# Patient Record
Sex: Male | Born: 1955 | Race: White | Hispanic: No | Smoking: Former smoker
Health system: Southern US, Community
[De-identification: ages and names within clinical notes are randomized; demographics above are authoritative.]

## PROBLEM LIST (undated history)

## (undated) DIAGNOSIS — Z21 Asymptomatic human immunodeficiency virus [HIV] infection status: Secondary | ICD-10-CM

## (undated) DIAGNOSIS — C61 Malignant neoplasm of prostate: Secondary | ICD-10-CM

## (undated) DIAGNOSIS — B20 Human immunodeficiency virus [HIV] disease: Secondary | ICD-10-CM

## (undated) DIAGNOSIS — Z9189 Other specified personal risk factors, not elsewhere classified: Secondary | ICD-10-CM

## (undated) DIAGNOSIS — K219 Gastro-esophageal reflux disease without esophagitis: Secondary | ICD-10-CM

## (undated) DIAGNOSIS — E119 Type 2 diabetes mellitus without complications: Secondary | ICD-10-CM

## (undated) DIAGNOSIS — R399 Unspecified symptoms and signs involving the genitourinary system: Secondary | ICD-10-CM

## (undated) DIAGNOSIS — E785 Hyperlipidemia, unspecified: Secondary | ICD-10-CM

## (undated) DIAGNOSIS — I10 Essential (primary) hypertension: Secondary | ICD-10-CM

## (undated) DIAGNOSIS — Z860100 Personal history of colon polyps, unspecified: Secondary | ICD-10-CM

## (undated) DIAGNOSIS — M1990 Unspecified osteoarthritis, unspecified site: Secondary | ICD-10-CM

## (undated) DIAGNOSIS — Z973 Presence of spectacles and contact lenses: Secondary | ICD-10-CM

## (undated) DIAGNOSIS — Z923 Personal history of irradiation: Secondary | ICD-10-CM

## (undated) HISTORY — DX: Unspecified osteoarthritis, unspecified site: M19.90

## (undated) HISTORY — PX: PROSTATE BIOPSY: SHX241

## (undated) HISTORY — PX: EYE SURGERY: SHX253

## (undated) HISTORY — PX: JOINT REPLACEMENT: SHX530

## (undated) HISTORY — DX: Malignant neoplasm of prostate: C61

## (undated) HISTORY — DX: Essential (primary) hypertension: I10

## (undated) HISTORY — DX: Gastro-esophageal reflux disease without esophagitis: K21.9

---

## 1983-12-17 HISTORY — PX: OTHER SURGICAL HISTORY: SHX169

## 1986-12-16 HISTORY — PX: OTHER SURGICAL HISTORY: SHX169

## 2011-12-17 HISTORY — PX: COLONOSCOPY W/ POLYPECTOMY: SHX1380

## 2014-04-01 ENCOUNTER — Ambulatory Visit (INDEPENDENT_AMBULATORY_CARE_PROVIDER_SITE_OTHER): Payer: BC Managed Care – PPO | Admitting: Internal Medicine

## 2014-04-01 DIAGNOSIS — Z7189 Other specified counseling: Secondary | ICD-10-CM

## 2014-04-01 DIAGNOSIS — Z7184 Encounter for health counseling related to travel: Secondary | ICD-10-CM

## 2014-04-01 DIAGNOSIS — Z23 Encounter for immunization: Secondary | ICD-10-CM

## 2014-04-01 MED ORDER — ATOVAQUONE-PROGUANIL HCL 250-100 MG PO TABS: 1.0000 | ORAL_TABLET | Freq: Every day | ORAL | Status: DC

## 2014-04-01 MED ORDER — CIPROFLOXACIN HCL 500 MG PO TABS: 500.0000 mg | ORAL_TABLET | Freq: Two times a day (BID) | ORAL | Status: DC

## 2014-04-01 MED ORDER — AZITHROMYCIN 500 MG PO TABS: 1000.0000 mg | ORAL_TABLET | Freq: Once | ORAL | Status: DC

## 2014-04-01 NOTE — Progress Notes (Signed)
  Subjective:    MERRITT MCCRAVY is a 58 y.o. male who presents to the Infectious Disease clinic for travel consultation. Planned departure date: October 2015          Planned return date: 10 days Countries of travel: [place] Areas in country: rural   Accommodations: rural and safari Purpose of travel: vacation Prior travel out of [place]: yes Currently ill / Fever: no History of liver or kidney disease: no  Data Review:  metformin and lisinopril   Review of Systems n/a    Objective:    n/a    Assessment:    No contraindications to travel. none      Plan:    Issues discussed: environmental concerns, future shots, insect-borne illnesses, malaria, motion sickness, MVA safety, rabies, safe food/water, traveler's diarrhea, website/handouts for more information, what to do if ill upon return, what to do if ill while there and Yellow Fever. Immunizations recommended: Typhoid (parenteral) and Yellow Fever. Malaria prophylaxis: malarone, daily dose starting 1-2 days before entering endemic area, ending 7 days after leaving area Traveler's diarrhea prophylaxis: ciprofloxacin.

## 2014-11-20 ENCOUNTER — Encounter: Payer: Self-pay | Admitting: Radiation Oncology

## 2014-11-20 DIAGNOSIS — C61 Malignant neoplasm of prostate: Secondary | ICD-10-CM | POA: Insufficient documentation

## 2014-11-20 NOTE — Progress Notes (Signed)
Radiation Oncology         [phone]) [phone] ________________________________  Initial outpatient Consultation  Name: William Mcfarland MRN: [id number]  Date: 11/21/2014  DOB: October 02, 1956  CC:No primary care provider on file.  Claybon Jabs, MD   REFERRING PHYSICIAN: Claybon Jabs, MD  DIAGNOSIS: 58 y.o. gentleman with stage T1c adenocarcinoma of the prostate with a Gleason's score of 4+3 and a PSA of 6.83    ICD-9-CM ICD-10-CM   1. Travel advice encounter V65.49 Z71.89   2. Malignant neoplasm of prostate 185 C61     HISTORY OF PRESENT ILLNESS::William Mcfarland is a 58 y.o. gentleman.  He was noted to have an elevated PSA of 6.83 on 07/26/14 by his primary care physician, Dr. Kathyrn Lass.  This was an increase from 2.86 on 02/02/13.  Accordingly, he was referred for evaluation in urology by Dr. Karsten Ro.  Digital rectal examination was performed at that time revealing no nodules.  The patient proceeded to transrectal ultrasound with 12 biopsies of the prostate on 08/23/14.  The prostate volume measured 32.56 cc.  Out of 12 core biopsies, 6 were positive.  The maximum Gleason score was 4+3, and this was seen in the left lateral base as displayed below:     The patient reviewed the biopsy results with his urologist and he has kindly been referred today for discussion of potential radiation treatment options.  PREVIOUS RADIATION THERAPY: No  PAST MEDICAL HISTORY:  has a past medical history of Prostate cancer; Arthritis; Diabetes mellitus without complication; GERD (gastroesophageal reflux disease); Hypercholesterolemia; and Hypertension.    PAST SURGICAL HISTORY: Past Surgical History  Procedure Laterality Date  . Prostate biopsy    . Ankle surgery    . Foot surgery    . Leg surgery      FAMILY HISTORY: family history includes Stroke in his father and mother. There is no history of Cancer.  SOCIAL HISTORY:  reports that he has quit smoking. His smoking use included  Cigarettes. He has a 7.5 pack-year smoking history. He has never used smokeless tobacco. He reports that he does not drink alcohol or use illicit drugs.  ALLERGIES: Review of patient's allergies indicates no known allergies.  MEDICATIONS:  Current Outpatient Prescriptions  Medication Sig Dispense Refill  . atovaquone-proguanil (MALARONE) 250-100 MG TABS Take 1 tablet by mouth daily. Start 2 days prior to travel to malaria area, throughout travel and for 7 days upon return. (Patient not taking: Reported on 11/21/2014) 17 tablet 0  . ciprofloxacin (CIPRO) 500 MG tablet Take 1 tablet (500 mg total) by mouth 2 (two) times daily. Take 2-3 days until diarrhea resolves (Patient not taking: Reported on 11/21/2014) 8 tablet 0  . lisinopril (PRINIVIL,ZESTRIL) 10 MG tablet     . metFORMIN (GLUMETZA) 500 MG (MOD) 24 hr tablet Take 500 mg by mouth daily after supper.    . simvastatin (ZOCOR) 20 MG tablet     . topiramate (TOPAMAX) 100 MG tablet      No current facility-administered medications for this encounter.    REVIEW OF SYSTEMS:  A 15 point review of systems is documented in the electronic medical record. This was obtained by the nursing staff. However, I reviewed this with the patient to discuss relevant findings and make appropriate changes.  A comprehensive review of systems was negative..  The patient completed an IPSS and IIEF questionnaire.  His IPSS score was 4 indicating mild urinary outflow obstructive symptoms.  He indicated that his erectile function  is almost always able to complete sexual activity.   PHYSICAL EXAM: This patient is in no acute distress.  He is alert and oriented.   height is 5\' 5"  (1.651 m) and weight is 196 lb 1.6 oz (88.95 kg).  He exhibits no respiratory distress or labored breathing.  He appears neurologically intact.  His mood is pleasant.  His affect is appropriate.  Please note the digital rectal exam findings described above.  KPS = 100  100 - Normal; no complaints;  no evidence of disease. 90   - Able to carry on normal activity; minor signs or symptoms of disease. 80   - Normal activity with effort; some signs or symptoms of disease. 36   - Cares for self; unable to carry on normal activity or to do active work. 60   - Requires occasional assistance, but is able to care for most of his personal needs. 50   - Requires considerable assistance and frequent medical care. 34   - Disabled; requires special care and assistance. 18   - Severely disabled; hospital admission is indicated although death not imminent. 30   - Very sick; hospital admission necessary; active supportive treatment necessary. 10   - Moribund; fatal processes progressing rapidly. 0     - Dead  Karnofsky DA, Abelmann WH, Craver LS and Burchenal JH [phone]) The use of the nitrogen mustards in the palliative treatment of carcinoma: with particular reference to bronchogenic carcinoma Cancer 1 634-56   LABORATORY DATA:  No results found for: WBC, HGB, HCT, MCV, PLT No results found for: NA, K, CL, CO2 No results found for: ALT, AST, GGT, ALKPHOS, BILITOT   RADIOGRAPHY: No results found.    IMPRESSION: This gentleman is a 58 y.o. gentleman with stage T1c adenocarcinoma of the prostate with a Gleason's score of 4+3 and a PSA of 6.83.  His T-Stage, Gleason's Score, and PSA put him into the intermediate risk group.  Accordingly he is eligible for a variety of potential treatment options including robotic-assisted laparoscopic radical prostatectomy, or external beam radiation treatment with or without seed implant boost. In addition to radiation therapy, this is a patient who may benefit from hormone therapy as well.Marland Kitchen  PLAN:Today I reviewed the findings and workup thus far.  We discussed the natural history of prostate cancer.  We reviewed the the implications of T-stage, Gleason's Score, and PSA on decision-making and outcomes in prostate cancer.  We discussed radiation treatment in the management  of prostate cancer with regard to the logistics and delivery of external beam radiation treatment as well as the logistics and delivery of prostate brachytherapy.  We compared and contrasted each of these approaches and also compared these against prostatectomy.  The patient expressed interest in external beam radiotherapy followed by prostate seed implant boost.  I filled out a patient counseling form for him with relevant treatment diagrams and we retained a copy for our records.   We also spent time talking about androgen deprivation and the potential advantages of this treatment in conjunction with radiation for intermediate risk prostate cancer. We talked about the pros and cons of hormone therapy focusing on the potential adverse effects. We also talked about some of the evidence supporting hormone therapy to improve disease free survival  The patient would like to proceed with external beam radiotherapy followed by prostate seed implant boost.  I will share my findings with Dr. Karsten Ro and move forward with scheduling the initiation of hormone therapy in early January 2016  to be followed by placement of three gold fiducial markers into the prostate in early February to proceed with external radiotherapy in the first week of March 2016, to be followed by prostate seed implant boost.     I enjoyed meeting with him today, and will look forward to participating in the care of this very nice gentleman.   I spent 60 minutes face to face with the patient and more than 50% of that time was spent in counseling and/or coordination of care.   ------------------------------------------------  Sheral Apley. Tammi Klippel, M.D.

## 2014-11-20 NOTE — Progress Notes (Signed)
GU Location of Tumor / Histology: prostatic adenocarcinoma  If Prostate Cancer, Gleason Score is (4 + 3) and PSA is (6.83)  Biopsies of prostate revealed:    Past/Anticipated interventions by urology, if any: prostate biopsy, androgen deprivation?, referral to radiation oncology   Past/Anticipated interventions by medical oncology, if any: no  Weight changes, if any: no  Bowel/Bladder complaints, if any: yes, urinary urgency, nocturia, weak urine stream post void dribble, increased frequency   Nausea/Vomiting, if any: no  Pain issues, if any:  no  SAFETY ISSUES:  Prior radiation? no  Pacemaker/ICD? no  Possible current pregnancy? no  Is the patient on methotrexate? 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

## 2014-11-21 ENCOUNTER — Ambulatory Visit
Admission: RE | Admit: 2014-11-21 | Discharge: 2014-11-21 | Disposition: A | Discharge status: Home / Self Care | Payer: BC Managed Care – PPO | Source: Ambulatory Visit | Attending: Radiation Oncology | Admitting: Radiation Oncology

## 2014-11-21 ENCOUNTER — Encounter: Payer: Self-pay | Admitting: Radiation Oncology

## 2014-11-21 VITALS — Ht 65.0 in | Wt 196.1 lb

## 2014-11-21 DIAGNOSIS — C61 Malignant neoplasm of prostate: Secondary | ICD-10-CM | POA: Diagnosis not present

## 2014-11-21 DIAGNOSIS — Z7189 Other specified counseling: Secondary | ICD-10-CM | POA: Diagnosis not present

## 2014-11-21 DIAGNOSIS — Z7184 Encounter for health counseling related to travel: Secondary | ICD-10-CM

## 2014-11-21 NOTE — Progress Notes (Signed)
Reports nocturia x 2. Denies dysuria. Reports hematuria following biopsy resolved. Describes a strong steady urine stream with an occasional post void dribble. Concerned about weight gain. Denies night sweats or bone pain. Denies diarrhea. Denies nausea, vomiting, headache or dizziness. Denies fatigue. Works full time for [employer]. BP slightly elevated.

## 2014-11-21 NOTE — Progress Notes (Signed)
See progress note under physician encounter. 

## 2014-11-22 ENCOUNTER — Telehealth: Payer: Self-pay | Admitting: *Deleted

## 2014-11-22 NOTE — Telephone Encounter (Signed)
CALLED PATIENT TO INFORM OF HORMONE THERAPY 12-21-14 - ARRIVAL TIME - 10:15 AM, GOLD SEED PLACEMENT ON 01-20-15- ARRIVAL TIME - 1:45 PM @ DR. OTTELIN'S OFFICE, AND HIS SIM ON 02-10-15 @ 9 AM @ DR. MANNING'S OFFICE, SPOKE WITH PATIENT AND HE IS AWARE OF THESE APPTS.

## 2014-11-22 NOTE — Addendum Note (Signed)
Encounter addended by: Heywood Footman, RN on: 11/22/2014 10:25 AM<BR>     Documentation filed: Inpatient Patient Education, Inpatient Document Flowsheet

## 2014-11-22 NOTE — Addendum Note (Signed)
Encounter addended by: Heywood Footman, RN on: 11/22/2014  9:49 AM<BR>     Documentation filed: Charges VN

## 2015-02-10 ENCOUNTER — Encounter: Payer: Self-pay | Admitting: Radiation Oncology

## 2015-02-10 ENCOUNTER — Ambulatory Visit
Admission: RE | Admit: 2015-02-10 | Discharge: 2015-02-10 | Disposition: A | Discharge status: Home / Self Care | Payer: BC Managed Care – PPO | Source: Ambulatory Visit | Attending: Radiation Oncology | Admitting: Radiation Oncology

## 2015-02-10 DIAGNOSIS — C61 Malignant neoplasm of prostate: Secondary | ICD-10-CM | POA: Diagnosis not present

## 2015-02-10 NOTE — Progress Notes (Signed)
  Radiation Oncology         [phone]) [phone] ________________________________  Name: William Mcfarland MRN: [id number]  Date: 02/10/2015  DOB: 28-Mar-1956  SIMULATION AND TREATMENT PLANNING NOTE    ICD-9-CM ICD-10-CM   1. Malignant neoplasm of prostate 185 C61     DIAGNOSIS:  59 y.o. gentleman with stage T1c adenocarcinoma of the prostate with a Gleason's score of 4+3 and a PSA of 6.83  NARRATIVE:  The patient was brought to the [facility].  Identity was confirmed.  All relevant records and images related to the planned course of therapy were reviewed.  The patient freely provided informed written consent to proceed with treatment after reviewing the details related to the planned course of therapy. The consent form was witnessed and verified by the simulation staff.  Then, the patient was set-up in a stable reproducible supine position for radiation therapy.  A vacuum lock pillow device was custom fabricated to position his legs in a reproducible immobilized position.  Then, I performed a urethrogram under sterile conditions to identify the prostatic apex.  CT images were obtained.  Surface markings were placed.  The CT images were loaded into the planning software.  Then the prostate target and avoidance structures including the rectum, bladder, bowel and hips were contoured.  Treatment planning then occurred.  The radiation prescription was entered and confirmed.  A total of 5 complex treatment devices were fabricated with the mold and 4 MLC apertures to shape radiation around the prostate shielding critical rectum and bladder. I have requested : 3D Simulation  I have requested a DVH of the following structures: bladder, rectum, left femoral head, right femoral head, small bowel, and prostate.  PLAN:  The patient will receive 45 Gy in 25 fractions followed by seed boost  ________________________________  Sheral Apley. Tammi Klippel, M.D.

## 2015-02-12 NOTE — Progress Notes (Signed)
  Radiation Oncology         [phone]) [phone] ________________________________  Name: William Mcfarland MRN: [id number]  Date: 02/10/2015  DOB: [birth year]  SIMULATION AND TREATMENT PLANNING NOTE PUBIC ARCH STUDY  CC:No primary care provider on file.  No ref. provider found  DIAGNOSIS:   59 y.o. gentleman with stage T1c adenocarcinoma of the prostate with a Gleason's score of 4+3 and a PSA of 6.83  COMPLEX SIMULATION:  The patient presented today for evaluation for external radiation planning and possible prostate seed implant boost.  Using three-dimensional radiation planning tools I reconstructed the prostate in view of the structures from the transperineal needle pathway to assess for possible pubic arch interference. In doing so, I did not appreciate any pubic arch interference. Also, the patient's prostate volume was estimated based on the drawn structure. The volume was 40 cc.  Given the pubic arch appearance and prostate volume, patient remains a good candidate to proceed with prostate seed implant. Today, he freely provided informed written consent to proceed.    PLAN: The patient will undergo prostate seed implant as a boost after external radiation.   ________________________________  Sheral Apley Tammi Klippel, M.D.

## 2015-02-13 DIAGNOSIS — C61 Malignant neoplasm of prostate: Secondary | ICD-10-CM | POA: Diagnosis not present

## 2015-02-14 DIAGNOSIS — C61 Malignant neoplasm of prostate: Secondary | ICD-10-CM | POA: Diagnosis not present

## 2015-02-15 ENCOUNTER — Other Ambulatory Visit: Payer: Self-pay | Admitting: Urology

## 2015-02-17 ENCOUNTER — Ambulatory Visit
Admission: RE | Admit: 2015-02-17 | Discharge: 2015-02-17 | Disposition: A | Discharge status: Home / Self Care | Payer: BC Managed Care – PPO | Source: Ambulatory Visit | Attending: Radiation Oncology | Admitting: Radiation Oncology

## 2015-02-17 DIAGNOSIS — C61 Malignant neoplasm of prostate: Secondary | ICD-10-CM | POA: Diagnosis not present

## 2015-02-20 ENCOUNTER — Ambulatory Visit
Admission: RE | Admit: 2015-02-20 | Discharge: 2015-02-20 | Disposition: A | Discharge status: Home / Self Care | Payer: BC Managed Care – PPO | Source: Ambulatory Visit | Attending: Radiation Oncology | Admitting: Radiation Oncology

## 2015-02-20 DIAGNOSIS — C61 Malignant neoplasm of prostate: Secondary | ICD-10-CM | POA: Diagnosis not present

## 2015-02-21 ENCOUNTER — Ambulatory Visit
Admission: RE | Admit: 2015-02-21 | Discharge: 2015-02-21 | Disposition: A | Discharge status: Home / Self Care | Payer: BC Managed Care – PPO | Source: Ambulatory Visit | Attending: Radiation Oncology | Admitting: Radiation Oncology

## 2015-02-21 DIAGNOSIS — C61 Malignant neoplasm of prostate: Secondary | ICD-10-CM | POA: Diagnosis not present

## 2015-02-22 ENCOUNTER — Ambulatory Visit
Admission: RE | Admit: 2015-02-22 | Discharge: 2015-02-22 | Disposition: A | Discharge status: Home / Self Care | Payer: BC Managed Care – PPO | Source: Ambulatory Visit | Attending: Radiation Oncology | Admitting: Radiation Oncology

## 2015-02-22 DIAGNOSIS — C61 Malignant neoplasm of prostate: Secondary | ICD-10-CM | POA: Diagnosis not present

## 2015-02-23 ENCOUNTER — Ambulatory Visit
Admission: RE | Admit: 2015-02-23 | Discharge: 2015-02-23 | Disposition: A | Discharge status: Home / Self Care | Payer: BC Managed Care – PPO | Source: Ambulatory Visit | Attending: Radiation Oncology | Admitting: Radiation Oncology

## 2015-02-23 DIAGNOSIS — C61 Malignant neoplasm of prostate: Secondary | ICD-10-CM | POA: Diagnosis not present

## 2015-02-24 ENCOUNTER — Encounter: Payer: Self-pay | Admitting: Radiation Oncology

## 2015-02-24 ENCOUNTER — Ambulatory Visit
Admission: RE | Admit: 2015-02-24 | Discharge: 2015-02-24 | Disposition: A | Discharge status: Home / Self Care | Payer: BC Managed Care – PPO | Source: Ambulatory Visit | Attending: Radiation Oncology | Admitting: Radiation Oncology

## 2015-02-24 VITALS — BP 157/98 | HR 67 | Resp 16 | Wt 199.3 lb

## 2015-02-24 DIAGNOSIS — C61 Malignant neoplasm of prostate: Secondary | ICD-10-CM | POA: Diagnosis not present

## 2015-02-24 NOTE — Progress Notes (Signed)
BP elevated. Reports nocturia, increased urinary frequency and dysuria. Denies diarrhea or fatigue. Denies pain. Weight stable.  Oriented patient to staff and routine of the clinic. Provided patient with RADIATION THERAPY AND YOU handbook then, reviewed pertinent information. Educated patient reference potential side effects and management such as, fatigue, urinary/bladder changes and fatigue. Patient verbalized understanding of all reviewed.

## 2015-02-24 NOTE — Progress Notes (Signed)
  Radiation Oncology         [phone]) [phone] ________________________________  Name: William Mcfarland MRN: [id number]  Date: 02/24/2015  DOB: 06/05/1956     Weekly Radiation Therapy Management    ICD-9-CM ICD-10-CM   1. Malignant neoplasm of prostate 185 C61     Current Dose: 9 Gy     Planned Dose:  45 Gy plus seed implant  Narrative . . . . . . . . The patient presents for routine under treatment assessment.                                   Reports nocturia, increased urinary frequency and dysuria. Denies diarrhea or fatigue. Denies pain. Weight stable                                 Set-up films were reviewed.                                 The chart was checked. Physical Findings. . .  weight is 199 lb 4.8 oz (90.402 kg). His blood pressure is 157/98 and his pulse is 67. His respiration is 16. . Weight essentially stable.  No significant changes. Impression . . . . . . . The patient is tolerating radiation. Plan . . . . . . . . . . . . Continue treatment as planned.  ________________________________  Sheral Apley. Tammi Klippel, M.D.

## 2015-02-27 ENCOUNTER — Ambulatory Visit
Admission: RE | Admit: 2015-02-27 | Discharge: 2015-02-27 | Disposition: A | Discharge status: Home / Self Care | Payer: BC Managed Care – PPO | Source: Ambulatory Visit | Attending: Radiation Oncology | Admitting: Radiation Oncology

## 2015-02-27 DIAGNOSIS — C61 Malignant neoplasm of prostate: Secondary | ICD-10-CM | POA: Diagnosis not present

## 2015-02-28 ENCOUNTER — Ambulatory Visit
Admission: RE | Admit: 2015-02-28 | Discharge: 2015-02-28 | Disposition: A | Discharge status: Home / Self Care | Payer: BC Managed Care – PPO | Source: Ambulatory Visit | Attending: Radiation Oncology | Admitting: Radiation Oncology

## 2015-02-28 DIAGNOSIS — C61 Malignant neoplasm of prostate: Secondary | ICD-10-CM | POA: Diagnosis not present

## 2015-03-01 ENCOUNTER — Ambulatory Visit
Admission: RE | Admit: 2015-03-01 | Discharge: 2015-03-01 | Disposition: A | Discharge status: Home / Self Care | Payer: BC Managed Care – PPO | Source: Ambulatory Visit | Attending: Radiation Oncology | Admitting: Radiation Oncology

## 2015-03-01 DIAGNOSIS — C61 Malignant neoplasm of prostate: Secondary | ICD-10-CM | POA: Diagnosis not present

## 2015-03-02 ENCOUNTER — Ambulatory Visit
Admission: RE | Admit: 2015-03-02 | Discharge: 2015-03-02 | Disposition: A | Discharge status: Home / Self Care | Payer: BC Managed Care – PPO | Source: Ambulatory Visit | Attending: Radiation Oncology | Admitting: Radiation Oncology

## 2015-03-02 DIAGNOSIS — C61 Malignant neoplasm of prostate: Secondary | ICD-10-CM | POA: Diagnosis not present

## 2015-03-03 ENCOUNTER — Ambulatory Visit
Admission: RE | Admit: 2015-03-03 | Discharge: 2015-03-03 | Disposition: A | Discharge status: Home / Self Care | Payer: BC Managed Care – PPO | Source: Ambulatory Visit | Attending: Radiation Oncology | Admitting: Radiation Oncology

## 2015-03-03 ENCOUNTER — Encounter: Payer: Self-pay | Admitting: Radiation Oncology

## 2015-03-03 VITALS — BP 173/101 | HR 79 | Resp 16 | Wt 198.2 lb

## 2015-03-03 DIAGNOSIS — C61 Malignant neoplasm of prostate: Secondary | ICD-10-CM

## 2015-03-03 NOTE — Progress Notes (Signed)
  Radiation Oncology         [phone]) [phone] ________________________________  Name: William Mcfarland MRN: [id number]  Date: 03/03/2015  DOB: [birth year]     Weekly Radiation Therapy Management    ICD-9-CM ICD-10-CM   1. Malignant neoplasm of prostate 185 C61     Current Dose: 18 Gy     Planned Dose:  45 Gy plus seed implant  Narrative . . . . . . . . The patient presents for routine under treatment assessment.                                   Weight and vitals stable. BP elevated. Reports he drank four cups of expresso prior to appt. Also, patient uncertain if he took bp medication this morning.  Encouraged to follow up with PCP if it continues to remain high. Patient verbalized understanding. Denies pain. Reports nocturia x 3. Reports he has horrible shoulder pain waking him up at night. Reports difficulty sleeping due to pain. Denies dysuria or hematuria. Denies diarrhea. Reports mild fatigue.                                  Set-up films were reviewed.                                 The chart was checked. Physical Findings. . .  weight is 198 lb 3.2 oz (89.903 kg). His blood pressure is 173/101 and his pulse is 79. His respiration is 16. . Weight essentially stable.  No significant changes. Impression . . . . . . . The patient is tolerating radiation. Plan . . . . . . . . . . . . Continue treatment as planned.  ________________________________  Sheral Apley. Tammi Klippel, M.D.

## 2015-03-03 NOTE — Progress Notes (Addendum)
Weight and vitals stable. BP elevated. Reports he drank four cups of expresso prior to appt. Also, patient uncertain if he took bp medication this morning.  Encouraged to follow up with PCP if it continues to remain high. Patient verbalized understanding. Denies pain. Reports nocturia x 3. Reports he has horrible shoulder pain waking him up at night. Reports difficulty sleeping due to pain. Denies dysuria or hematuria. Denies diarrhea. Reports mild fatigue.

## 2015-03-06 ENCOUNTER — Ambulatory Visit
Admission: RE | Admit: 2015-03-06 | Discharge: 2015-03-06 | Disposition: A | Discharge status: Home / Self Care | Payer: BC Managed Care – PPO | Source: Ambulatory Visit | Attending: Radiation Oncology | Admitting: Radiation Oncology

## 2015-03-06 ENCOUNTER — Other Ambulatory Visit: Payer: Self-pay

## 2015-03-06 ENCOUNTER — Encounter (HOSPITAL_BASED_OUTPATIENT_CLINIC_OR_DEPARTMENT_OTHER)
Admission: RE | Admit: 2015-03-06 | Discharge: 2015-03-06 | Disposition: A | Discharge status: Home / Self Care | Payer: BC Managed Care – PPO | Source: Ambulatory Visit | Attending: Urology | Admitting: Urology

## 2015-03-06 ENCOUNTER — Ambulatory Visit (HOSPITAL_BASED_OUTPATIENT_CLINIC_OR_DEPARTMENT_OTHER)
Admission: RE | Admit: 2015-03-06 | Discharge: 2015-03-06 | Disposition: A | Discharge status: Home / Self Care | Payer: BC Managed Care – PPO | Source: Ambulatory Visit | Attending: Urology | Admitting: Urology

## 2015-03-06 DIAGNOSIS — I10 Essential (primary) hypertension: Secondary | ICD-10-CM | POA: Insufficient documentation

## 2015-03-06 DIAGNOSIS — C61 Malignant neoplasm of prostate: Secondary | ICD-10-CM | POA: Diagnosis not present

## 2015-03-07 ENCOUNTER — Ambulatory Visit
Admission: RE | Admit: 2015-03-07 | Discharge: 2015-03-07 | Disposition: A | Discharge status: Home / Self Care | Payer: BC Managed Care – PPO | Source: Ambulatory Visit | Attending: Radiation Oncology | Admitting: Radiation Oncology

## 2015-03-07 DIAGNOSIS — C61 Malignant neoplasm of prostate: Secondary | ICD-10-CM | POA: Diagnosis not present

## 2015-03-08 ENCOUNTER — Ambulatory Visit
Admission: RE | Admit: 2015-03-08 | Discharge: 2015-03-08 | Disposition: A | Discharge status: Home / Self Care | Payer: BC Managed Care – PPO | Source: Ambulatory Visit | Attending: Radiation Oncology | Admitting: Radiation Oncology

## 2015-03-08 DIAGNOSIS — C61 Malignant neoplasm of prostate: Secondary | ICD-10-CM | POA: Diagnosis not present

## 2015-03-09 ENCOUNTER — Encounter: Payer: Self-pay | Admitting: Radiation Oncology

## 2015-03-09 ENCOUNTER — Ambulatory Visit
Admission: RE | Admit: 2015-03-09 | Discharge: 2015-03-09 | Disposition: A | Discharge status: Home / Self Care | Payer: BC Managed Care – PPO | Source: Ambulatory Visit | Attending: Radiation Oncology | Admitting: Radiation Oncology

## 2015-03-09 VITALS — BP 157/85 | HR 73 | Resp 16 | Wt 195.0 lb

## 2015-03-09 DIAGNOSIS — C61 Malignant neoplasm of prostate: Secondary | ICD-10-CM

## 2015-03-09 NOTE — Progress Notes (Signed)
Weight and vitals stable. Denies pain. Reports nocturia x3. Reports horrible right shoulder pain is a little less but now suffers from seasonal allergies. Denies dysuria or hematuria. Denies diarrhea. Reports mild fatigue.

## 2015-03-09 NOTE — Progress Notes (Signed)
  Radiation Oncology         [phone]) [phone] ________________________________  Name: William Mcfarland MRN: [id number]  Date: 03/09/2015  DOB: 03/09/1956     Weekly Radiation Therapy Management    ICD-9-CM ICD-10-CM   1. Malignant neoplasm of prostate 185 C61     Current Dose: 25.2 Gy     Planned Dose:  45 Gy plus seed implant  Narrative . . . . . . . . The patient presents for routine under treatment assessment.                                   Weight and vitals stable. Denies pain. Reports nocturia x3. Reports horrible right shoulder pain is a little less but now suffers from seasonal allergies. Denies dysuria or hematuria. Denies diarrhea. Reports mild fatigue.                                  Set-up films were reviewed.                                 The chart was checked. Physical Findings. . .  weight is 195 lb (88.451 kg). His blood pressure is 157/85 and his pulse is 73. His respiration is 16. . Weight essentially stable.  No significant changes. Impression . . . . . . . The patient is tolerating radiation. Plan . . . . . . . . . . . . Continue treatment as planned.  ________________________________  Sheral Apley. Tammi Klippel, M.D.

## 2015-03-10 ENCOUNTER — Ambulatory Visit: Payer: BC Managed Care – PPO | Admitting: Radiation Oncology

## 2015-03-10 ENCOUNTER — Ambulatory Visit
Admission: RE | Admit: 2015-03-10 | Discharge: 2015-03-10 | Disposition: A | Discharge status: Home / Self Care | Payer: BC Managed Care – PPO | Source: Ambulatory Visit | Attending: Radiation Oncology | Admitting: Radiation Oncology

## 2015-03-10 DIAGNOSIS — C61 Malignant neoplasm of prostate: Secondary | ICD-10-CM | POA: Diagnosis not present

## 2015-03-13 ENCOUNTER — Ambulatory Visit
Admission: RE | Admit: 2015-03-13 | Discharge: 2015-03-13 | Disposition: A | Discharge status: Home / Self Care | Payer: BC Managed Care – PPO | Source: Ambulatory Visit | Attending: Radiation Oncology | Admitting: Radiation Oncology

## 2015-03-13 DIAGNOSIS — C61 Malignant neoplasm of prostate: Secondary | ICD-10-CM | POA: Diagnosis not present

## 2015-03-14 ENCOUNTER — Ambulatory Visit
Admission: RE | Admit: 2015-03-14 | Discharge: 2015-03-14 | Disposition: A | Discharge status: Home / Self Care | Payer: BC Managed Care – PPO | Source: Ambulatory Visit | Attending: Radiation Oncology | Admitting: Radiation Oncology

## 2015-03-14 DIAGNOSIS — C61 Malignant neoplasm of prostate: Secondary | ICD-10-CM | POA: Diagnosis not present

## 2015-03-15 ENCOUNTER — Ambulatory Visit
Admission: RE | Admit: 2015-03-15 | Discharge: 2015-03-15 | Disposition: A | Discharge status: Home / Self Care | Payer: BC Managed Care – PPO | Source: Ambulatory Visit | Attending: Radiation Oncology | Admitting: Radiation Oncology

## 2015-03-15 DIAGNOSIS — C61 Malignant neoplasm of prostate: Secondary | ICD-10-CM | POA: Diagnosis not present

## 2015-03-16 ENCOUNTER — Ambulatory Visit
Admission: RE | Admit: 2015-03-16 | Discharge: 2015-03-16 | Disposition: A | Discharge status: Home / Self Care | Payer: BC Managed Care – PPO | Source: Ambulatory Visit | Attending: Radiation Oncology | Admitting: Radiation Oncology

## 2015-03-16 DIAGNOSIS — C61 Malignant neoplasm of prostate: Secondary | ICD-10-CM | POA: Diagnosis not present

## 2015-03-17 ENCOUNTER — Ambulatory Visit
Admission: RE | Admit: 2015-03-17 | Discharge: 2015-03-17 | Disposition: A | Discharge status: Home / Self Care | Payer: BC Managed Care – PPO | Source: Ambulatory Visit | Attending: Radiation Oncology | Admitting: Radiation Oncology

## 2015-03-17 ENCOUNTER — Encounter: Payer: Self-pay | Admitting: Radiation Oncology

## 2015-03-17 VITALS — BP 158/96 | HR 81 | Temp 98.0°F | Resp 16 | Wt 201.6 lb

## 2015-03-17 DIAGNOSIS — C61 Malignant neoplasm of prostate: Secondary | ICD-10-CM | POA: Diagnosis not present

## 2015-03-17 NOTE — Progress Notes (Signed)
Reports decreased appetite. Weight slightly elevated. BP elevated. Plans to follow up with PCP next week due to continued elevated BP. Reports nocturia x4. Reports right shoulder pain is worse this week related to bone spur. Denies dysuria or hematuria. Reports fatigue. Reports tingling sensation with urination.

## 2015-03-17 NOTE — Progress Notes (Signed)
  Radiation Oncology         [phone]) [phone] ________________________________  Name: William Mcfarland MRN: [id number]  Date: 03/17/2015  DOB: [birth year]     Weekly Radiation Therapy Management    ICD-9-CM ICD-10-CM   1. Malignant neoplasm of prostate 185 C61     Current Dose: 36Gy     Planned Dose:  45 Gy plus seed implant  Narrative . . . . . . . . The patient presents for routine under treatment assessment.                                    Reports decreased appetite. Weight slightly elevated. BP elevated. Plans to follow up with PCP next week due to continued elevated BP. Reports nocturia x4. Reports right shoulder pain is worse this week related to bone spur. Denies dysuria or hematuria. Reports fatigue. Reports tingling sensation with urination.                                  Set-up images were reviewed.                                 The chart was checked. Physical Findings. . .  weight is 201 lb 9.6 oz (91.445 kg). His oral temperature is 98 F (36.7 C). His blood pressure is 158/96 and his pulse is 81. His respiration is 16 and oxygen saturation is 100%. . Weight essentially stable.  No significant changes. Impression . . . . . . . The patient is tolerating radiation. Plan . . . . . . . . . . . . Continue treatment as planned. Offered Flomax Rx but patient would like to wait.  Nocturia is not very bothersome   -----------------------------------  Eppie Gibson, MD

## 2015-03-20 ENCOUNTER — Ambulatory Visit
Admission: RE | Admit: 2015-03-20 | Discharge: 2015-03-20 | Disposition: A | Discharge status: Home / Self Care | Payer: BC Managed Care – PPO | Source: Ambulatory Visit | Attending: Radiation Oncology | Admitting: Radiation Oncology

## 2015-03-20 DIAGNOSIS — C61 Malignant neoplasm of prostate: Secondary | ICD-10-CM | POA: Diagnosis not present

## 2015-03-21 ENCOUNTER — Ambulatory Visit
Admission: RE | Admit: 2015-03-21 | Discharge: 2015-03-21 | Disposition: A | Discharge status: Home / Self Care | Payer: BC Managed Care – PPO | Source: Ambulatory Visit | Attending: Radiation Oncology | Admitting: Radiation Oncology

## 2015-03-21 DIAGNOSIS — C61 Malignant neoplasm of prostate: Secondary | ICD-10-CM | POA: Diagnosis not present

## 2015-03-22 ENCOUNTER — Ambulatory Visit
Admission: RE | Admit: 2015-03-22 | Discharge: 2015-03-22 | Disposition: A | Discharge status: Home / Self Care | Payer: BC Managed Care – PPO | Source: Ambulatory Visit | Attending: Radiation Oncology | Admitting: Radiation Oncology

## 2015-03-22 DIAGNOSIS — C61 Malignant neoplasm of prostate: Secondary | ICD-10-CM | POA: Diagnosis not present

## 2015-03-23 ENCOUNTER — Ambulatory Visit
Admission: RE | Admit: 2015-03-23 | Discharge: 2015-03-23 | Disposition: A | Discharge status: Home / Self Care | Payer: BC Managed Care – PPO | Source: Ambulatory Visit | Attending: Radiation Oncology | Admitting: Radiation Oncology

## 2015-03-23 DIAGNOSIS — C61 Malignant neoplasm of prostate: Secondary | ICD-10-CM | POA: Diagnosis not present

## 2015-03-24 ENCOUNTER — Ambulatory Visit
Admission: RE | Admit: 2015-03-24 | Discharge: 2015-03-24 | Disposition: A | Discharge status: Home / Self Care | Payer: BC Managed Care – PPO | Source: Ambulatory Visit | Attending: Radiation Oncology | Admitting: Radiation Oncology

## 2015-03-24 ENCOUNTER — Encounter: Payer: Self-pay | Admitting: Radiation Oncology

## 2015-03-24 VITALS — BP 163/87 | HR 89 | Resp 16 | Wt 202.2 lb

## 2015-03-24 DIAGNOSIS — C61 Malignant neoplasm of prostate: Secondary | ICD-10-CM

## 2015-03-24 NOTE — Progress Notes (Signed)
  Radiation Oncology         [phone]) [phone] ________________________________  Name: William Mcfarland MRN: [id number]  Date: 03/24/2015  DOB: 18-Jun-1956     Weekly Radiation Therapy Management  No diagnosis found.  Current Dose: 45 Gy     Planned Dose:  45 Gy plus seed implant  Narrative . . . . . . . . The patient presents for routine under treatment assessment.                                   Reports nocturia x 5. Reports right shoulder pain continues which wakes him up often during the night as well. Describes urine stream as a weak dribble. Reports mild dysuria. Denies hematuria. Reports he passed a gold seed. Reports diarrhea related to high residual diet. Reports fatigue. Reports lisinopril has been increased by PCP to address continued elevated BP. Scheduled to follow up with Dr. Karsten Ro 5/20. Scheduled for seed implantation on 4/29.                                  Set-up films were reviewed.                                 The chart was checked. Physical Findings. . .  weight is 202 lb 3.2 oz (91.717 kg). His blood pressure is 163/87 and his pulse is 89. His respiration is 16. . Weight essentially stable.  No significant changes. Impression . . . . . . . The patient is tolerating radiation. Plan . . . . . . . . . . . . Complete treatment as planned and then seed implant.   This document serves as a record of services personally performed by Tyler Pita, MD. It was created on his behalf by Pearlie Oyster, a trained medical scribe. The creation of this record is based on the scribe's personal observations and the provider's statements to them. This document has been checked and approved by the attending provider.      ________________________________  Sheral Apley. Tammi Klippel, M.D.

## 2015-03-24 NOTE — Progress Notes (Signed)
Reports nocturia x 5. Reports right shoulder pain continues which wakes him up often during the night as well. Describes urine stream as a weak dribble. Reports mild dysuria. Denies hematuria. Reports he passed a gold seed. Reports diarrhea related to high residual diet. Reports fatigue. Reports lisinopril has been increased by PCP to address continued elevated BP. Scheduled to follow up with Dr. Karsten Ro 5/20. Scheduled for seed implantation on 4/29.

## 2015-03-28 NOTE — Progress Notes (Signed)
  Radiation Oncology         [phone]) [phone] ________________________________  Name: William Mcfarland MRN: [id number]  Date: 03/24/2015  DOB: December 06, 1956  End of Treatment Note    ICD-9-CM ICD-10-CM   1. Malignant neoplasm of prostate 185 C61    DIAGNOSIS: 59 y.o. gentleman with stage T1c adenocarcinoma of the prostate with a Gleason's score of 4+3 and a PSA of 6.83     Indication for treatment:  Curative,  Prostate Radiotherapy to be followed by seed implant boost  Radiation treatment dates:   02/20/2015-03/24/2015  Site/dose:  1. Initially, the prostate was treated with external radiotherapy to 45 Gy in 25 fractions of 1.8 Gy 2. Next, prostate seed implant will be used to boost the dose with 110 Gy for total of 155 Gy.  Beams/energy:  1. Initially, the prostate was treated with external radiotherapy using a 4-field technique, with 3D conformal AP, PA, Rt and Lt fields with 15 MV X-rays and daily image guidance.  Image guidance was performed with CB CT studies prior to each fraction. He was immobilized with a body fix lower extremity mold. 2. Next, prostate seed implant was used to boost the dose using Iodine-125 seeds..   Narrative: The patient tolerated radiation treatment relatively well.   He had minimal bladder and bowel issues.  Plan: The patient has completed external radiation treatment. He will return for seed implant boost. ________________________________  Sheral Apley. Tammi Klippel, M.D.

## 2015-04-06 ENCOUNTER — Telehealth: Payer: Self-pay | Admitting: *Deleted

## 2015-04-06 NOTE — Telephone Encounter (Signed)
CALLED PATIENT TO  REMIND OF LABS FOR 04-07-15, SPOKE WITH PATIENT AND HE IS AWARE OF THIS

## 2015-04-11 DIAGNOSIS — K219 Gastro-esophageal reflux disease without esophagitis: Secondary | ICD-10-CM | POA: Diagnosis not present

## 2015-04-11 DIAGNOSIS — Z87891 Personal history of nicotine dependence: Secondary | ICD-10-CM | POA: Diagnosis not present

## 2015-04-11 DIAGNOSIS — M1990 Unspecified osteoarthritis, unspecified site: Secondary | ICD-10-CM | POA: Diagnosis not present

## 2015-04-11 DIAGNOSIS — Z6829 Body mass index (BMI) 29.0-29.9, adult: Secondary | ICD-10-CM | POA: Diagnosis not present

## 2015-04-11 DIAGNOSIS — C61 Malignant neoplasm of prostate: Secondary | ICD-10-CM | POA: Diagnosis not present

## 2015-04-11 DIAGNOSIS — E780 Pure hypercholesterolemia: Secondary | ICD-10-CM | POA: Diagnosis not present

## 2015-04-11 DIAGNOSIS — E669 Obesity, unspecified: Secondary | ICD-10-CM | POA: Diagnosis not present

## 2015-04-11 DIAGNOSIS — E119 Type 2 diabetes mellitus without complications: Secondary | ICD-10-CM | POA: Diagnosis not present

## 2015-04-11 DIAGNOSIS — I10 Essential (primary) hypertension: Secondary | ICD-10-CM | POA: Diagnosis not present

## 2015-04-11 DIAGNOSIS — N402 Nodular prostate without lower urinary tract symptoms: Secondary | ICD-10-CM | POA: Diagnosis not present

## 2015-04-11 DIAGNOSIS — Z79899 Other long term (current) drug therapy: Secondary | ICD-10-CM | POA: Diagnosis not present

## 2015-04-11 DIAGNOSIS — Z791 Long term (current) use of non-steroidal anti-inflammatories (NSAID): Secondary | ICD-10-CM | POA: Diagnosis not present

## 2015-04-11 LAB — COMPREHENSIVE METABOLIC PANEL
ALT: 21 U/L (ref 0–53)
AST: 19 U/L (ref 0–37)
Albumin: 4.4 g/dL (ref 3.5–5.2)
Alkaline Phosphatase: 72 U/L (ref 39–117)
Anion gap: 10 (ref 5–15)
BUN: 19 mg/dL (ref 6–23)
CO2: 26 mmol/L (ref 19–32)
Calcium: 9.3 mg/dL (ref 8.4–10.5)
Chloride: 102 mmol/L (ref 96–112)
Creatinine, Ser: 0.74 mg/dL (ref 0.50–1.35)
GFR calc Af Amer: >90 mL/min (ref >90)
GFR calc non Af Amer: >90 mL/min (ref >90)
Glucose, Bld: 132 mg/dL — ABNORMAL HIGH (ref 70–99)
Potassium: 4.1 mmol/L (ref 3.5–5.1)
Sodium: 138 mmol/L (ref 135–145)
Total Bilirubin: 0.5 mg/dL (ref 0.3–1.2)
Total Protein: 7.2 g/dL (ref 6.0–8.3)

## 2015-04-11 LAB — PROTIME-INR
INR: 0.98 (ref 0.00–1.49)
Prothrombin Time: 13.1 seconds (ref 11.6–15.2)

## 2015-04-11 LAB — CBC
HCT: 40.1 % (ref 39.0–52.0)
Hemoglobin: 14.1 g/dL (ref 13.0–17.0)
MCH: 32 pg (ref 26.0–34.0)
MCHC: 35.2 g/dL (ref 30.0–36.0)
MCV: 90.9 fL (ref 78.0–100.0)
Platelets: 292 10*3/uL (ref 150–400)
RBC: 4.41 MIL/uL (ref 4.22–5.81)
RDW: 13 % (ref 11.5–15.5)
WBC: 7.3 10*3/uL (ref 4.0–10.5)

## 2015-04-11 LAB — APTT: aPTT: 29 seconds (ref 24–37)

## 2015-04-12 ENCOUNTER — Encounter (HOSPITAL_BASED_OUTPATIENT_CLINIC_OR_DEPARTMENT_OTHER): Payer: Self-pay | Admitting: *Deleted

## 2015-04-12 NOTE — Progress Notes (Signed)
   04/12/15 1621  OBSTRUCTIVE SLEEP APNEA  Have you ever been diagnosed with sleep apnea through a sleep study? No  Do you snore loudly (loud enough to be heard through closed doors)?  1  Do you often feel tired, fatigued, or sleepy during the daytime? 0  Has anyone observed you stop breathing during your sleep? 0  Do you have, or are you being treated for high blood pressure? 1  BMI more than 35 kg/m2? 0  Age over 59 years old? 1  Neck circumference greater than 40 cm/16 inches? 1  Gender: 1  Obstructive Sleep Apnea Score 5

## 2015-04-12 NOTE — Progress Notes (Signed)
NPO AFTER MN. ARRIVE AT 0600. CURRENT LAB RESULTS, EKG , CXR IN CHART AND EPIC. WILL DO FLEET ENEMA AM DOS.

## 2015-04-13 ENCOUNTER — Telehealth: Payer: Self-pay | Admitting: *Deleted

## 2015-04-13 NOTE — Telephone Encounter (Signed)
CALLED PATIENT TO REMIND OF PROCEDURE FOR 04-14-15, SPOKE WITH PATIENT AND HE IS AWARE OF THIS PROCEDURE

## 2015-04-14 ENCOUNTER — Ambulatory Visit (HOSPITAL_BASED_OUTPATIENT_CLINIC_OR_DEPARTMENT_OTHER): Payer: BC Managed Care – PPO | Admitting: Anesthesiology

## 2015-04-14 ENCOUNTER — Ambulatory Visit (HOSPITAL_BASED_OUTPATIENT_CLINIC_OR_DEPARTMENT_OTHER)
Admission: RE | Admit: 2015-04-14 | Discharge: 2015-04-14 | Disposition: A | Discharge status: Home / Self Care | Payer: BC Managed Care – PPO | Source: Ambulatory Visit | Attending: Urology | Admitting: Urology

## 2015-04-14 ENCOUNTER — Ambulatory Visit (HOSPITAL_COMMUNITY): Payer: BC Managed Care – PPO

## 2015-04-14 ENCOUNTER — Encounter (HOSPITAL_BASED_OUTPATIENT_CLINIC_OR_DEPARTMENT_OTHER)
Admission: RE | Disposition: A | Discharge status: Home / Self Care | Payer: Self-pay | Source: Ambulatory Visit | Attending: Urology

## 2015-04-14 ENCOUNTER — Encounter (HOSPITAL_BASED_OUTPATIENT_CLINIC_OR_DEPARTMENT_OTHER): Payer: Self-pay | Admitting: *Deleted

## 2015-04-14 DIAGNOSIS — K219 Gastro-esophageal reflux disease without esophagitis: Secondary | ICD-10-CM | POA: Insufficient documentation

## 2015-04-14 DIAGNOSIS — Z791 Long term (current) use of non-steroidal anti-inflammatories (NSAID): Secondary | ICD-10-CM | POA: Insufficient documentation

## 2015-04-14 DIAGNOSIS — C61 Malignant neoplasm of prostate: Secondary | ICD-10-CM | POA: Diagnosis not present

## 2015-04-14 DIAGNOSIS — Z87891 Personal history of nicotine dependence: Secondary | ICD-10-CM | POA: Insufficient documentation

## 2015-04-14 DIAGNOSIS — E119 Type 2 diabetes mellitus without complications: Secondary | ICD-10-CM | POA: Insufficient documentation

## 2015-04-14 DIAGNOSIS — E669 Obesity, unspecified: Secondary | ICD-10-CM | POA: Insufficient documentation

## 2015-04-14 DIAGNOSIS — Z79899 Other long term (current) drug therapy: Secondary | ICD-10-CM | POA: Insufficient documentation

## 2015-04-14 DIAGNOSIS — M1990 Unspecified osteoarthritis, unspecified site: Secondary | ICD-10-CM | POA: Insufficient documentation

## 2015-04-14 DIAGNOSIS — I10 Essential (primary) hypertension: Secondary | ICD-10-CM | POA: Insufficient documentation

## 2015-04-14 DIAGNOSIS — Z6829 Body mass index (BMI) 29.0-29.9, adult: Secondary | ICD-10-CM | POA: Insufficient documentation

## 2015-04-14 DIAGNOSIS — N402 Nodular prostate without lower urinary tract symptoms: Secondary | ICD-10-CM | POA: Insufficient documentation

## 2015-04-14 DIAGNOSIS — E780 Pure hypercholesterolemia: Secondary | ICD-10-CM | POA: Insufficient documentation

## 2015-04-14 HISTORY — DX: Presence of spectacles and contact lenses: Z97.3

## 2015-04-14 HISTORY — DX: Personal history of colonic polyps: Z86.010

## 2015-04-14 HISTORY — DX: Personal history of colon polyps, unspecified: Z86.0100

## 2015-04-14 HISTORY — DX: Type 2 diabetes mellitus without complications: E11.9

## 2015-04-14 HISTORY — DX: Personal history of irradiation: Z92.3

## 2015-04-14 HISTORY — DX: Hyperlipidemia, unspecified: E78.5

## 2015-04-14 HISTORY — DX: Unspecified symptoms and signs involving the genitourinary system: R39.9

## 2015-04-14 HISTORY — DX: Other specified personal risk factors, not elsewhere classified: Z91.89

## 2015-04-14 HISTORY — PX: RADIOACTIVE SEED IMPLANT: SHX5150

## 2015-04-14 LAB — GLUCOSE, CAPILLARY
Glucose-Capillary: 109 mg/dL — ABNORMAL HIGH (ref 70–99)
Glucose-Capillary: 151 mg/dL — ABNORMAL HIGH (ref 70–99)

## 2015-04-14 SURGERY — INSERTION, RADIATION SOURCE, PROSTATE
Anesthesia: General | Site: Prostate

## 2015-04-14 MED ORDER — CIPROFLOXACIN IN D5W 400 MG/200ML IV SOLN
400.0000 mg | INTRAVENOUS | Status: AC
  Administered 2015-04-14: 400 mg via INTRAVENOUS
  Filled 2015-04-14: qty 200

## 2015-04-14 MED ORDER — HYDROCODONE-ACETAMINOPHEN 5-325 MG PO TABS
ORAL_TABLET | ORAL | Status: AC
  Filled 2015-04-14: qty 1

## 2015-04-14 MED ORDER — DEXAMETHASONE SODIUM PHOSPHATE 4 MG/ML IJ SOLN
INTRAMUSCULAR | Status: DC | PRN
  Administered 2015-04-14: 10 mg via INTRAVENOUS

## 2015-04-14 MED ORDER — DEXTROSE 5 % IV SOLN
10.0000 mg | INTRAVENOUS | Status: DC | PRN
  Administered 2015-04-14: 25 ug/min via INTRAVENOUS

## 2015-04-14 MED ORDER — STERILE WATER FOR IRRIGATION IR SOLN
Status: DC | PRN
  Administered 2015-04-14: 500 mL

## 2015-04-14 MED ORDER — MIDAZOLAM HCL 2 MG/2ML IJ SOLN
INTRAMUSCULAR | Status: AC
  Filled 2015-04-14: qty 2

## 2015-04-14 MED ORDER — MIDAZOLAM HCL 5 MG/5ML IJ SOLN
INTRAMUSCULAR | Status: DC | PRN
  Administered 2015-04-14: 2 mg via INTRAVENOUS

## 2015-04-14 MED ORDER — IOHEXOL 350 MG/ML SOLN
INTRAVENOUS | Status: DC | PRN
  Administered 2015-04-14: 7 mL

## 2015-04-14 MED ORDER — PROPOFOL 10 MG/ML IV BOLUS
INTRAVENOUS | Status: DC | PRN
  Administered 2015-04-14: 200 mg via INTRAVENOUS

## 2015-04-14 MED ORDER — FENTANYL CITRATE (PF) 100 MCG/2ML IJ SOLN
INTRAMUSCULAR | Status: AC
  Filled 2015-04-14: qty 4

## 2015-04-14 MED ORDER — FENTANYL CITRATE (PF) 100 MCG/2ML IJ SOLN
INTRAMUSCULAR | Status: DC | PRN
  Administered 2015-04-14: 100 ug via INTRAVENOUS
  Administered 2015-04-14 (×2): 50 ug via INTRAVENOUS

## 2015-04-14 MED ORDER — CIPROFLOXACIN HCL 500 MG PO TABS: 500.0000 mg | ORAL_TABLET | Freq: Two times a day (BID) | ORAL | Status: DC

## 2015-04-14 MED ORDER — FLEET ENEMA 7-19 GM/118ML RE ENEM
1.0000 | ENEMA | Freq: Once | RECTAL | Status: DC
  Filled 2015-04-14: qty 1

## 2015-04-14 MED ORDER — HYDROCODONE-ACETAMINOPHEN 10-325 MG PO TABS: 1.0000 | ORAL_TABLET | ORAL | Status: DC | PRN

## 2015-04-14 MED ORDER — CIPROFLOXACIN IN D5W 400 MG/200ML IV SOLN
INTRAVENOUS | Status: AC
  Filled 2015-04-14: qty 200

## 2015-04-14 MED ORDER — HYDROCODONE-ACETAMINOPHEN 5-325 MG PO TABS
1.0000 | ORAL_TABLET | Freq: Once | ORAL | Status: AC
  Administered 2015-04-14: 1 via ORAL
  Filled 2015-04-14: qty 1

## 2015-04-14 MED ORDER — FENTANYL CITRATE (PF) 100 MCG/2ML IJ SOLN
25.0000 ug | INTRAMUSCULAR | Status: DC | PRN
  Filled 2015-04-14: qty 1

## 2015-04-14 MED ORDER — ROCURONIUM BROMIDE 100 MG/10ML IV SOLN
INTRAVENOUS | Status: DC | PRN
  Administered 2015-04-14: 40 mg via INTRAVENOUS
  Administered 2015-04-14: 10 mg via INTRAVENOUS
  Administered 2015-04-14: 15 mg via INTRAVENOUS

## 2015-04-14 MED ORDER — ONDANSETRON HCL 4 MG/2ML IJ SOLN
4.0000 mg | Freq: Four times a day (QID) | INTRAMUSCULAR | Status: DC | PRN
  Filled 2015-04-14: qty 2

## 2015-04-14 MED ORDER — ACETAMINOPHEN 10 MG/ML IV SOLN
INTRAVENOUS | Status: DC | PRN
Start: 2015-04-14 — End: 2015-04-14
  Administered 2015-04-14: 1000 mg via INTRAVENOUS

## 2015-04-14 MED ORDER — NEOSTIGMINE METHYLSULFATE 10 MG/10ML IV SOLN
INTRAVENOUS | Status: DC | PRN
  Administered 2015-04-14: 3 mg via INTRAVENOUS

## 2015-04-14 MED ORDER — STERILE WATER FOR IRRIGATION IR SOLN
Status: DC | PRN
  Administered 2015-04-14: 3000 mL

## 2015-04-14 MED ORDER — GLYCOPYRROLATE 0.2 MG/ML IJ SOLN
INTRAMUSCULAR | Status: DC | PRN
Start: 2015-04-14 — End: 2015-04-14
  Administered 2015-04-14: 0.4 mg via INTRAVENOUS

## 2015-04-14 MED ORDER — LACTATED RINGERS IV SOLN
INTRAVENOUS | Status: DC
  Administered 2015-04-14: 07:00:00 via INTRAVENOUS
  Filled 2015-04-14: qty 1000

## 2015-04-14 MED ORDER — ONDANSETRON HCL 4 MG/2ML IJ SOLN
INTRAMUSCULAR | Status: DC | PRN
  Administered 2015-04-14: 4 mg via INTRAVENOUS

## 2015-04-14 SURGICAL SUPPLY — 30 items
BAG URINE DRAINAGE (UROLOGICAL SUPPLIES) ×2 IMPLANT
BLADE CLIPPER SURG (BLADE) ×2 IMPLANT
CATH FOLEY 2WAY SLVR  5CC 16FR (CATHETERS) ×2
CATH FOLEY 2WAY SLVR 5CC 16FR (CATHETERS) ×2 IMPLANT
CATH ROBINSON RED A/P 20FR (CATHETERS) ×2 IMPLANT
CLOTH BEACON ORANGE TIMEOUT ST (SAFETY) ×2 IMPLANT
COVER BACK TABLE 60X90IN (DRAPES) ×2 IMPLANT
COVER MAYO STAND STRL (DRAPES) ×2 IMPLANT
DRSG TEGADERM 4X4.75 (GAUZE/BANDAGES/DRESSINGS) ×2 IMPLANT
DRSG TEGADERM 8X12 (GAUZE/BANDAGES/DRESSINGS) ×2 IMPLANT
GLOVE BIO SURGEON STRL SZ 6.5 (GLOVE) ×2 IMPLANT
GLOVE BIO SURGEON STRL SZ7.5 (GLOVE) IMPLANT
GLOVE BIO SURGEON STRL SZ8 (GLOVE) ×4 IMPLANT
GLOVE BIOGEL PI IND STRL 6.5 (GLOVE) ×2 IMPLANT
GLOVE BIOGEL PI IND STRL 7.5 (GLOVE) ×1 IMPLANT
GLOVE BIOGEL PI INDICATOR 6.5 (GLOVE) ×2
GLOVE BIOGEL PI INDICATOR 7.5 (GLOVE) ×1
GLOVE ECLIPSE 8.0 STRL XLNG CF (GLOVE) ×10 IMPLANT
GOWN STRL REUS W/ TWL XL LVL3 (GOWN DISPOSABLE) ×1 IMPLANT
GOWN STRL REUS W/TWL XL LVL3 (GOWN DISPOSABLE) ×1
GOWN XL W/COTTON TOWEL STD (GOWNS) ×2 IMPLANT
HOLDER FOLEY CATH W/STRAP (MISCELLANEOUS) ×2 IMPLANT
IV NS IRRIG 3000ML ARTHROMATIC (IV SOLUTION) IMPLANT
IV WATER IRR. 1000ML (IV SOLUTION) ×2 IMPLANT
NUCLETRON SELECTSEED ×128 IMPLANT
PACK CYSTO (CUSTOM PROCEDURE TRAY) ×2 IMPLANT
SYRINGE 10CC LL (SYRINGE) ×2 IMPLANT
UNDERPAD 30X30 INCONTINENT (UNDERPADS AND DIAPERS) ×4 IMPLANT
WATER STERILE IRR 3000ML UROMA (IV SOLUTION) ×2 IMPLANT
WATER STERILE IRR 500ML POUR (IV SOLUTION) ×2 IMPLANT

## 2015-04-14 NOTE — Anesthesia Procedure Notes (Signed)
Procedure Name: Intubation Date/Time: 04/14/2015 7:35 AM Performed by: Wanita Chamberlain Pre-anesthesia Checklist: Patient identified, Timeout performed, Emergency Drugs available, Suction available and Patient being monitored Patient Re-evaluated:Patient Re-evaluated prior to inductionOxygen Delivery Method: Circle system utilized Preoxygenation: Pre-oxygenation with 100% oxygen Intubation Type: IV induction Ventilation: Mask ventilation without difficulty and Oral airway inserted - appropriate to patient size Laryngoscope Size: Mac and 3 Grade View: Grade III Tube type: Oral Tube size: 8.0 mm Number of attempts: 2 Airway Equipment and Method: Stylet and Bite block Placement Confirmation: ETT inserted through vocal cords under direct vision,  positive ETCO2 and breath sounds checked- equal and bilateral Secured at: 22 cm Tube secured with: Tape Dental Injury: Teeth and Oropharynx as per pre-operative assessment  Difficulty Due To: Difficulty was anticipated and Difficult Airway- due to reduced neck mobility Comments:  Pt w/ full beard 2.5 FB to TM Laryngoscopy X2 Paxson Harrower CRNA Laryngoscopy by Hodierne MD With cricoid manipulation Intubation successful.

## 2015-04-14 NOTE — Discharge Instructions (Signed)
DISCHARGE INSTRUCTIONS FOR PROSTATE SEED IMPLANTATION  Removal of catheter Remove the foley catheter after 24 hours ( day after the procedure).can be done easily by cutting the side port of the catheter, whichallow the balloon to deflate.  You will see 1-2 teaspoons of clear water as the balloon deflates and then the catheter can be slid out without difficulty.        Cut here  Antibiotics You may be given a prescription for an antibiotic to take when you arrive home. If so, be sure to take every tablet in the bottle, even if you are feeling better before the prescription is finished. If you begin itching, notice a rash or start to swell on your trunk, arms, legs and/or throat, immediately stop taking the antibiotic and call your Urologist. Diet Resume your usual diet when you return home. To keep your bowels moving easily and softly, drink prune, apple and cranberry juice at room temperature. You may also take a stool softener, such as Colace, which is available without prescription at local pharmacies. Daily activities ? No driving or heavy lifting for at least two days after the implant. ? No bike riding, horseback riding or riding lawn mowers for the first month after the implant. ? Any strenuous physical activity should be approved by your doctor before you resume it. Sexual relations You may resume sexual relations two weeks after the procedure. A condom should be used for the first two weeks. Your semen may be dark brown or black; this is normal and is related bleeding that may have occurred during the implant. Postoperative swelling Expect swelling and bruising of the scrotum and perineum (the area between the scrotum and anus). Both the swelling and the bruising should resolve in l or 2 weeks. Ice packs and over- the-counter medications such as Tylenol, Advil or Aleve may lessen your discomfort. Postoperative urination Most men experience burning on urination and/or urinary frequency.  If this becomes bothersome, contact your Urologist.  Medication can be prescribed to relieve these problems.  It is normal to have some blood in your urine for a few days after the implant. Special instructions related to the seeds It is unlikely that you will pass an Iodine-125 seed in your urine. The seeds are silver in color and are about as large as a grain of rice. If you pass a seed, do not handle it with your fingers. Use a spoon to place it in an envelope or jar in place this in base occluded area such as the garage or basement for return to the radiation clinic at your convenience.  Contact your doctor for ? Temperature greater than 101 F ? Increasing pain ? Inability to urinate Follow-up  You should have follow up with your urologist and radiation oncologist about 3 weeks after the procedure. General information regarding Iodine seeds ? Iodine-125 is a low energy radioactive material. It is not deeply penetrating and loses energy at short distances. Your prostate will absorb the radiation. Objects that are touched or used by the patient do not become radioactive. ? Body wastes (urine and stool) or body fluids (saliva, tears, semen or blood) are not radioactive. ? The Nuclear Regulatory Commission [facility]) has determined that no radiation precautions are needed for patients undergoing Iodine-125 seed implantation. The [facility] states that such patients do not present a risk to the people around them, including young children and pregnant women. However, in keeping with the general principle that radiation exposure should be kept as low reasonably  possible, we suggest the following: ? Children and pets should not sit on the patient's lap for the first two (2) weeks after the implant. ? Pregnant (or possibly pregnant) women should avoid prolonged, close contact with the patient for the first two (2) weeks after the implant. ? A distance of three (3) feet is acceptable. ? At a distance of three (3)  feet, there is no limit to the length of time anyone can be with the patient.     Post Anesthesia Home Care Instructions  Activity: Get plenty of rest for the remainder of the day. A responsible adult should stay with you for 24 hours following the procedure.  For the next 24 hours, DO NOT: -Drive a car -Paediatric nurse -Drink alcoholic beverages -Take any medication unless instructed by your physician -Make any legal decisions or sign important papers.  Meals: Start with liquid foods such as gelatin or soup. Progress to regular foods as tolerated. Avoid greasy, spicy, heavy foods. If nausea and/or vomiting occur, drink only clear liquids until the nausea and/or vomiting subsides. Call your physician if vomiting continues.  Special Instructions/Symptoms: Your throat may feel dry or sore from the anesthesia or the breathing tube placed in your throat during surgery. If this causes discomfort, gargle with warm salt water. The discomfort should disappear within 24 hours.  If you had a scopolamine patch placed behind your ear for the management of post- operative nausea and/or vomiting:  1. The medication in the patch is effective for 72 hours, after which it should be removed.  Wrap patch in a tissue and discard in the trash. Wash hands thoroughly with soap and water. 2. You may remove the patch earlier than 72 hours if you experience unpleasant side effects which may include dry mouth, dizziness or visual disturbances. 3. Avoid touching the patch. Wash your hands with soap and water after contact with the patch.

## 2015-04-14 NOTE — Transfer of Care (Signed)
Immediate Anesthesia Transfer of Care Note  Patient: William Mcfarland  Procedure(s) Performed: Procedure(s) with comments: RADIOACTIVE SEED IMPLANT (N/A) -  77     seeds implanted no seeds found in bladder  Patient Location: PACU  Anesthesia Type:General  Level of Consciousness: awake, alert , oriented and patient cooperative  Airway & Oxygen Therapy: Patient Spontanous Breathing and Patient connected to nasal cannula oxygen  Post-op Assessment: Report given to RN and Post -op Vital signs reviewed and stable  Post vital signs: Reviewed and stable  Last Vitals:  Filed Vitals:   04/14/15 0556  BP: 159/96  Pulse: 86  Temp: 36.7 C  Resp: 16    Complications: No apparent anesthesia complications

## 2015-04-14 NOTE — Anesthesia Postprocedure Evaluation (Signed)
Anesthesia Post Note  Patient: William Mcfarland  Procedure(s) Performed: Procedure(s) (LRB): RADIOACTIVE SEED IMPLANT (N/A)  Anesthesia type: General  Patient location: PACU  Post pain: Pain level controlled and Adequate analgesia  Post assessment: Post-op Vital signs reviewed, Patient's Cardiovascular Status Stable, Respiratory Function Stable, Patent Airway and Pain level controlled  Last Vitals:  Filed Vitals:   04/14/15 0945  BP: 139/69  Pulse: 66  Temp:   Resp: 20    Post vital signs: Reviewed and stable  Level of consciousness: awake, alert  and oriented  Complications: No apparent anesthesia complications

## 2015-04-14 NOTE — H&P (Signed)
William Mcfarland is a 59 year old male with prostate cancer.  History of Present Illness Adenocarcinoma of the prostate: I saw him in 1/12 for a 2 mm firm nodule in the right lobe of the prostate with a PSA of 1.46 at the time. I felt the area was benign to my examination and recommended follow-up in 6 months however he failed to return. His PSA in 7/15 had increased to 6.83.   TRUS/BX 08/23/14: His prostate measured 33 cc with no gross extra capsular extension.  Pathology: Adenocarcinoma 5/12 cores positive (1 core 4+3, 3 cores 3+4 and 1 core 3+3). Of note none of the biopsies were positive from the area where I felt the small nodule in the past.   Past Medical History Problems  1. History of Arthritis 2. History of diabetes mellitus (Z86.39) 3. History of esophageal reflux (Z87.19) 4. History of hypercholesterolemia (Z86.39) 5. History of hyperlipidemia (Z86.39) 6. History of hypertension (Z86.79) 7. History of obesity (Z86.39) 8. History of Joint Pain, Localized In The Knee 9. 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 History of Ankle Surgery 2. History of Biopsy Of The Prostate Needle 3. History of Foot Surgery 4. History of Leg Repair  Current Meds 1. Ibuprofen CAPS; TAKE CAPSULE  PRN;  Therapy: (Recorded:20Aug2015) to Recorded 2. Levofloxacin 500 MG Oral Tablet; 1 tablet the day before the procedure, 1 tablet the day of  the procedure, and 1 tablet the day after the procedure;  Therapy: 20Aug2015 to (Last Rx:20Aug2015)  Requested for: 20Aug2015 Ordered 3. Multi-Day TABS;  Therapy: (Recorded:20Aug2015) to Recorded  Allergies Medication  1. No Known Drug Allergies Non-Medication  2. Pollen  Family History Problems  1. Family history of Father Deceased At Age ____   Died at 76 from a stroke 2. Family history of Polyps Of The Sigmoid Colon : Father 3. Family history of Stroke Syndrome : Mother  Social  History Problems  1. Denied: History of Alcohol Use 2. Caffeine Use   4-5 cups a day "butt load" 3. Former smoker (Z87.891)   1/2 ppd x 15 years 4. Marital History - Single 5. Occupation:   Therapist, occupational   Review of Systems Genitourinary, constitutional, skin, eye, otolaryngeal, hematologic/lymphatic, cardiovascular, pulmonary, endocrine, musculoskeletal, gastrointestinal, neurological and psychiatric system(s) were reviewed and pertinent findings if present are noted.  Genitourinary: feelings of urinary urgency, nocturia and weak urinary stream.  Gastrointestinal: diarrhea.  Constitutional: recent weight loss.  Integumentary: pruritus.  Hematologic/Lymphatic: swollen glands.  Musculoskeletal: back pain and joint pain.    Vitals Vital Signs Height: 5 ft 5 in Weight: 175 lb  BMI Calculated: 29.12 BSA Calculated: 1.87 Blood Pressure: 148 / 78 Temperature: 97.8 F Heart Rate: 62  Physical Exam Constitutional: Well nourished and well developed . No acute distress.  ENT:. The ears and nose are normal in appearance.  Neck: The appearance of the neck is normal and no neck mass is present.  Pulmonary: No respiratory distress and normal respiratory rhythm and effort.  Cardiovascular: Heart rate and rhythm are normal . No peripheral edema.  Abdomen: The abdomen is soft and nontender. No masses are palpated. No CVA tenderness. No hernias are palpable. No hepatosplenomegaly noted.  Rectal: Rectal exam demonstrates normal sphincter tone, no tenderness and no masses. Estimated prostate size is 1+. The prostate is somewhat bulbar and shape. The prostate has no nodularity and is not tender. The left seminal vesicle is nonpalpable. The right seminal vesicle is nonpalpable. The  perineum is normal on inspection.  Genitourinary: Examination of the penis demonstrates no discharge, no masses, no lesions and a normal meatus. The scrotum is without lesions. The right epididymis is palpably  normal and non-tender. The left epididymis is palpably normal and non-tender. The right testis is non-tender and without masses. The left testis is non-tender and without masses.  Lymphatics: The femoral and inguinal nodes are not enlarged or tender.  Skin: Normal skin turgor, no visible rash and no visible skin lesions.  Neuro/Psych:. Mood and affect are appropriate.     Assessment  I went over his pathology report with him today as well as the significance of his Gleason score, number and location of cores positive and percent of cores positive. We then discussed the fact that he has intermediate risk disease but does have some Gleason 4+3 present. His biopsy was not positive where he had the previous nodule palpated. I then discussed with him the various options available including active surveillance and treatment for cure such as radiation and surgery. We briefly discussed the forms of radiation available. I also gave him written information outlining the disease, its workup and the options for treatment for him to review further.   Plan The patient was counseled about the natural history of prostate cancer and the standard treatment options that are available for prostate cancer. It was explained to him how his age and life expectancy, clinical stage, Gleason score, and PSA affect his prognosis, the decision to proceed with additional staging studies, as well as how that information influences recommended treatment strategies. We discussed the roles for active surveillance, radiation therapy, surgical therapy, androgen deprivation, as well as ablative therapy options for the treatment of prostate cancer as appropriate to his individual cancer situation. We discussed the risks and benefits of these options with regard to their impact on cancer control and also in terms of potential adverse events, complications, and impact on quiality of life particularly related to urinary, bowel, and sexual  function. The patient was encouraged to ask questions throughout the discussion today and all questions were answered to his stated satisfaction. In addition, the patient was provided with and/or directed to appropriate resources and literature for further education about prostate cancer and treatment options.   After reviewing his options and consulting with Dr. Tammi Klippel elected to proceed with neoadjuvant ADT with IMRT and radioactive seed boost.  He presents today for his seed implantation.

## 2015-04-14 NOTE — Progress Notes (Signed)
Dr. Karsten Ro called, patient anxious and wanting foley catheter removed. States that he is going to remove catheter once he leaves the surgery center, reported above to Dr. Karsten Ro. See order to remove foley catheter.

## 2015-04-14 NOTE — Op Note (Signed)
PATIENT:  William Mcfarland  PRE-OPERATIVE DIAGNOSIS:  Adenocarcinoma of the prostate  POST-OPERATIVE DIAGNOSIS:  Same  PROCEDURE:  Procedure(s): 1. I-125 radioactive seed implantation 2. Cystoscopy  SURGEON:  Surgeon(s): Claybon Jabs  Radiation oncologist: Dr. Tyler Pita  ANESTHESIA:  General  EBL:  Minimal  DRAINS: 52 French Foley catheter  INDICATION: William Mcfarland is a 59 year old male with biopsy-proven adenocarcinoma of the prostate with 6 cores positive for cancer one of which had Gleason 4+3 = 7. His clinical stage was T1c. He underwent IMRT and returns today for radioactive seed boost.  Description of procedure: After informed consent the patient was brought to the major OR, placed on the table and administered general anesthesia. He was then moved to the modified lithotomy position with his perineum perpendicular to the floor. His perineum and genitalia were then sterilely prepped. An official timeout was then performed. A 16 French Foley catheter was then placed in the bladder and filled with dilute contrast, a rectal tube was placed in the rectum and the transrectal ultrasound probe was placed in the rectum and affixed to the stand. He was then sterilely draped.` `                                                         Real time ultrasonography was used along with the seed planning software [employer] Prostate vs. 4.2.21. This was used to develop the seed plan including the number of needles as well as number of seeds required for complete and adequate coverage. Real-time ultrasonography was then used along with the previously developed plan and the Nucletron device to implant a total of 64 seeds using 23 needles. This proceeded without difficulty or complication.  A Foley catheter was then removed as well as the transrectal ultrasound probe and rectal probe. Flexible cystoscopy was then performed using the 17 French flexible scope which revealed a normal urethra  throughout its length down to the sphincter which appeared intact. The prostatic urethra revealed bilobar hypertrophy but no evidence of obstruction, seeds, spacers or lesions. The bladder was then entered and fully and systematically inspected. The ureteral orifices were noted to be of normal configuration and position. The mucosa revealed no evidence of tumors. There was 3+ trabeculation. There were also no stones identified within the bladder. I noted no seeds or spacers on the floor of the bladder and retroflexion of the scope revealed no seeds protruding from the base of the prostate.  The cystoscope was then removed and a new 83 French Foley catheter was then inserted and the balloon was filled with 10 cc of sterile water. This was connected to closed system drainage and the patient was awakened and taken to recovery room in stable and satisfactory condition. He tolerated procedure well and there were no intraoperative complications.

## 2015-04-14 NOTE — Progress Notes (Signed)
  Radiation Oncology         [phone]) [phone] ________________________________  Name: William Mcfarland MRN: [id number]  Date: 04/14/2015  DOB: 08-08-1956       Prostate Seed Implant  CC:No primary care provider on file.  No ref. provider found  DIAGNOSIS: 59 y.o. gentleman with stage T1c adenocarcinoma of the prostate with a Gleason's score of 4+3 and a PSA of 6.83    ICD-9-CM ICD-10-CM   1. Prostate cancer 83 C61 DG Chest 2 View     DG Chest 2 View     Discharge patient    PROCEDURE: Insertion of radioactive I-125 seeds into the prostate gland.  RADIATION DOSE: 145 Gy, definitive therapy.  TECHNIQUE: William Mcfarland was brought to the operating room with the urologist. He was placed in the dorsolithotomy position. He was catheterized and a rectal tube was inserted. The perineum was shaved, prepped and draped. The ultrasound probe was then introduced into the rectum to see the prostate gland.  TREATMENT DEVICE: A needle grid was attached to the ultrasound probe stand and anchor needles were placed.  3D PLANNING: The prostate was imaged in 3D using a sagittal sweep of the prostate probe. These images were transferred to the planning computer. There, the prostate, urethra and rectum were defined on each axial reconstructed image. Then, the software created an optimized 3D plan and a few seed positions were adjusted. The quality of the plan was reviewed using [facility] information for the target and the following two organs at risk:  Urethra and Rectum.  Then the accepted plan was uploaded to the seed Selectron afterloading unit.  PROSTATE VOLUME STUDY:  Using transrectal ultrasound the volume of the prostate was verified to be 33 cc.  SPECIAL TREATMENT PROCEDURE/SUPERVISION AND HANDLING: The Nucletron FIRST system was used to place the needles under sagittal guidance. A total of 25 needles were used to deposit 64 seeds in the prostate gland. The individual seed activity was 0.359 mCi for  a total implant activity of 22.9 mCi.  COMPLEX SIMULATION: At the end of the procedure, an anterior radiograph of the pelvis was obtained to document seed positioning and count. Cystoscopy was performed to check the urethra and bladder.  MICRODOSIMETRY: At the end of the procedure, the patient was emitting 0.05 mrem/hr at 1 meter. Accordingly, he was considered safe for hospital discharge.  PLAN: The patient will return to the radiation oncology clinic for post implant CT dosimetry in three weeks.   ________________________________  Sheral Apley Tammi Klippel, M.D.

## 2015-04-14 NOTE — Anesthesia Preprocedure Evaluation (Addendum)
Anesthesia Evaluation  Patient identified by MRN, date of birth, ID band Patient awake    Reviewed: Allergy & Precautions, NPO status , Patient's Chart, lab work & pertinent test results  Airway Mallampati: II  TM Distance: <3 FB Neck ROM: Limited    Dental  (+) Teeth Intact, Dental Advisory Given, Implants,    Pulmonary former smoker,  breath sounds clear to auscultation  Pulmonary exam normal       Cardiovascular Exercise Tolerance: Good hypertension, Pt. on medications Rhythm:regular Rate:Normal     Neuro/Psych negative neurological ROS  negative psych ROS   GI/Hepatic Neg liver ROS, GERD-  Medicated,  Endo/Other  diabetes, Type 2obese  Renal/GU negative Renal ROS   Prostate CA    Musculoskeletal  (+) Arthritis -, Osteoarthritis,  Painful shoulder joints   Abdominal   Peds negative pediatric ROS (+)  Hematology negative hematology ROS (+)   Anesthesia Other Findings   Reproductive/Obstetrics                          Anesthesia Physical Anesthesia Plan  ASA: II  Anesthesia Plan: General   Post-op Pain Management:    Induction: Intravenous  Airway Management Planned: LMA and Oral ETT  Additional Equipment:   Intra-op Plan:   Post-operative Plan: Extubation in OR  Informed Consent: I have reviewed the patients History and Physical, chart, labs and discussed the procedure including the risks, benefits and alternatives for the proposed anesthesia with the patient or authorized representative who has indicated his/her understanding and acceptance.   Dental advisory given and History available from chart only  Plan Discussed with: CRNA, Anesthesiologist and Surgeon  Anesthesia Plan Comments:        Anesthesia Quick Evaluation

## 2015-04-17 ENCOUNTER — Encounter (HOSPITAL_BASED_OUTPATIENT_CLINIC_OR_DEPARTMENT_OTHER): Payer: Self-pay | Admitting: Urology

## 2015-05-01 NOTE — Addendum Note (Signed)
Addendum  created 05/01/15 1417 by Albertha Ghee, MD   Modules edited: Anesthesia Responsible Staff

## 2015-05-03 ENCOUNTER — Telehealth: Payer: Self-pay | Admitting: *Deleted

## 2015-05-03 NOTE — Telephone Encounter (Signed)
Called patient to remind of appts. For 05-04-15, lvm for a return call

## 2015-05-04 ENCOUNTER — Ambulatory Visit
Admit: 2015-05-04 | Discharge: 2015-05-04 | Disposition: A | Discharge status: Home / Self Care | Payer: BC Managed Care – PPO | Attending: Radiation Oncology | Admitting: Radiation Oncology

## 2015-05-04 ENCOUNTER — Encounter: Payer: Self-pay | Admitting: Radiation Oncology

## 2015-05-04 ENCOUNTER — Ambulatory Visit
Admission: RE | Admit: 2015-05-04 | Discharge: 2015-05-04 | Disposition: A | Discharge status: Home / Self Care | Payer: BC Managed Care – PPO | Source: Ambulatory Visit | Attending: Radiation Oncology | Admitting: Radiation Oncology

## 2015-05-04 VITALS — BP 155/101 | HR 84 | Resp 16 | Wt 201.3 lb

## 2015-05-04 DIAGNOSIS — C61 Malignant neoplasm of prostate: Secondary | ICD-10-CM | POA: Diagnosis not present

## 2015-05-04 MED ORDER — TAMSULOSIN HCL 0.4 MG PO CAPS: 0.4000 mg | ORAL_CAPSULE | Freq: Every day | ORAL | Status: DC

## 2015-05-04 NOTE — Progress Notes (Signed)
  Radiation Oncology         [phone]) [phone] ________________________________  Name: William Mcfarland MRN: [id number]  Date: 05/04/2015  DOB: May 07, 1956  COMPLEX SIMULATION NOTE  NARRATIVE:  The patient was brought to the [facility] today following prostate seed implantation approximately one month ago.  Identity was confirmed.  All relevant records and images related to the planned course of therapy were reviewed.  Then, the patient was set-up supine.  CT images were obtained.  The CT images were loaded into the planning software.  Then the prostate and rectum were contoured.  Treatment planning then occurred.  The implanted iodine 125 seeds were identified by the physics staff for projection of radiation distribution  I have requested : 3D Simulation  I have requested a DVH of the following structures: Prostate and rectum.    This document serves as a record of services personally performed by Tyler Pita, MD. It was created on his behalf by Arlyce Harman, a trained medical scribe. The creation of this record is based on the scribe's personal observations and the provider's statements to them. This document has been checked and approved by the attending provider.     ________________________________  Sheral Apley. Tammi Klippel, M.D.

## 2015-05-04 NOTE — Progress Notes (Addendum)
Weight stable. BP elevated. Denies pain. Pre seed IPSS 4. Post seed IPSS 21. Patient complains of urgency, occasional leakage, nocturia x4, and weak stream. Scheduled to follow up with Dr. Karsten Ro tomorrow. Plans to request Testerone check.

## 2015-05-04 NOTE — Progress Notes (Signed)
Radiation Oncology         [phone]) [phone] ________________________________  Name: William Mcfarland MRN: [id number]  Date: 05/04/2015  DOB: 07/25/1956    Follow-Up Visit Note  CC: No primary care provider on file.  Kathie Rhodes, MD  Diagnosis:  59 yo man with Stage T1c adenocarcinoma of the prostate with a Gleason's score of 4+3 and a PSA of 6.83    ICD-9-CM ICD-10-CM   1. Malignant neoplasm of prostate 185 C61 tamsulosin (FLOMAX) 0.4 MG CAPS capsule    Interval Since Last Radiation:  3  weeks  Narrative:  The patient returns today for routine follow-up.  He is complaining of increased urinary frequency and urinary hesitation symptoms. He filled out a questionnaire regarding urinary function today providing and overall IPSS score of 21 characterizing his symptoms as severe.  His pre-implant score was 4. He denies any bowel symptoms.  Weight stable. BP elevated. Denies pain. Pre seed IPSS 4. Post seed IPSS 21. Patient complains of urgency, frequency, occasional leakage, nocturia x4, and weak stream. Scheduled to follow up with Dr. Karsten Ro tomorrow. Plans to request Testerone check.  He is not currently taking any medication to help his symptoms.   ALLERGIES:  is allergic to amoxicillin.  Meds: Current Outpatient Prescriptions  Medication Sig Dispense Refill  . HYDROcodone-acetaminophen (NORCO) 10-325 MG per tablet Take 1-2 tablets by mouth every 4 (four) hours as needed for moderate pain. Maximum dose per 24 hours - 8 pills 18 tablet 0  . lisinopril (PRINIVIL,ZESTRIL) 40 MG tablet Take 40 mg by mouth every morning.    . traMADol (ULTRAM) 50 MG tablet Take 50 mg by mouth every 6 (six) hours as needed.    . tamsulosin (FLOMAX) 0.4 MG CAPS capsule Take 1 capsule (0.4 mg total) by mouth daily after supper. 30 capsule 5   No current facility-administered medications for this encounter.    Physical Findings: The patient is in no acute distress. Patient is alert and oriented.  weight is 201 lb 4.8 oz (91.309 kg). His blood pressure is 155/101 and his pulse is 84. His respiration is 16. .  No significant changes.  Lab Findings: Lab Results  Component Value Date   WBC 7.3 04/11/2015   HGB 14.1 04/11/2015   HCT 40.1 04/11/2015   MCV 90.9 04/11/2015   PLT 292 04/11/2015    Radiographic Findings:  Patient underwent CT imaging in our clinic for post implant dosimetry. The CT appears to demonstrate an adequate distribution of radioactive seeds throughout the prostate gland. There no seeds in her near the rectum. I suspect the final radiation plan and dosimetry will show appropriate coverage of the prostate gland.   Impression: The patient is recovering from the effects of radiation. His urinary symptoms should gradually improve over the next 4-6 months. We talked about this today. He is encouraged by his improvement already and is otherwise please with his outcome.   Plan: Today, I spent time talking to the patient about his prostate seed implant and resolving urinary symptoms. We also talked about long-term follow-up for prostate cancer following seed implant. He understands that ongoing PSA determinations and digital rectal exams will help perform surveillance to rule out disease recurrence. He understands what to expect with his PSA measures. Patient was also educated today about some of the long-term effects from radiation including a small risk for rectal bleeding and possibly erectile dysfunction. We talked about some of the general management approaches to these potential complications. However, I did  encourage the patient to contact our office or return at any point if he has questions or concerns related to his previous radiation and prostate cancer. Prescribe Flomax to help with his urinary symptoms. [facility] on [address] in [facility].  This document serves as a record of services personally performed by Tyler Pita, MD. It was created on his behalf by  Arlyce Harman, a trained medical scribe. The creation of this record is based on the scribe's personal observations and the provider's statements to them. This document has been checked and approved by the attending provider.     _____________________________________  Sheral Apley. Tammi Klippel, M.D.

## 2015-05-26 ENCOUNTER — Encounter: Payer: Self-pay | Admitting: Radiation Oncology

## 2015-05-26 DIAGNOSIS — C61 Malignant neoplasm of prostate: Secondary | ICD-10-CM | POA: Diagnosis not present

## 2015-05-28 NOTE — Progress Notes (Signed)
  Radiation Oncology         [phone]) [phone] ________________________________  Name: William Mcfarland MRN: [id number]  Date: 05/26/2015  DOB: [birth year]  3D Planning Note   Prostate Brachytherapy Post-Implant Dosimetry  Diagnosis:  59 yo man with Stage T1c adenocarcinoma of the prostate with a Gleason's score of 4+3 and a PSA of 6.83  Narrative: On a previous date, William Mcfarland returned following prostate seed implantation for post implant planning. He underwent CT scan complex simulation to delineate the three-dimensional structures of the pelvis and demonstrate the radiation distribution.  Since that time, the seed localization, and complex isodose planning with dose volume histograms have now been completed.  Results:   Prostate Coverage - The dose of radiation delivered to the 90% or more of the prostate gland (D90) was 96.61% of the prescription dose. This exceeds our goal of greater than 90%. Rectal Sparing - The volume of rectal tissue receiving the prescription dose or higher was 0.0 cc. This falls under our thresholds tolerance of 1.0 cc.  Impression: The prostate seed implant appears to show adequate target coverage and appropriate rectal sparing.  Plan:  The patient will continue to follow with urology for ongoing PSA determinations. I would anticipate a high likelihood for local tumor control with minimal risk for rectal morbidity.  ________________________________  Sheral Apley Tammi Klippel, M.D.

## 2015-05-30 NOTE — Addendum Note (Signed)
Encounter addended by: Heywood Footman, RN on: 05/30/2015  2:55 PM<BR>     Documentation filed: Inpatient Document Flowsheet

## 2015-11-01 ENCOUNTER — Other Ambulatory Visit: Payer: Self-pay | Admitting: Radiation Oncology

## 2015-11-22 ENCOUNTER — Other Ambulatory Visit: Payer: Self-pay | Admitting: Orthopedic Surgery

## 2015-11-22 DIAGNOSIS — S46001S Unspecified injury of muscle(s) and tendon(s) of the rotator cuff of right shoulder, sequela: Principal | ICD-10-CM

## 2015-11-22 DIAGNOSIS — M19111 Post-traumatic osteoarthritis, right shoulder: Secondary | ICD-10-CM

## 2015-12-02 ENCOUNTER — Other Ambulatory Visit: Payer: Self-pay | Admitting: Radiation Oncology

## 2015-12-04 ENCOUNTER — Other Ambulatory Visit: Payer: Self-pay | Admitting: Radiation Oncology

## 2015-12-04 ENCOUNTER — Ambulatory Visit
Admission: RE | Admit: 2015-12-04 | Discharge: 2015-12-04 | Disposition: A | Discharge status: Home / Self Care | Payer: BC Managed Care – PPO | Source: Ambulatory Visit | Attending: Orthopedic Surgery | Admitting: Orthopedic Surgery

## 2015-12-04 DIAGNOSIS — M19111 Post-traumatic osteoarthritis, right shoulder: Secondary | ICD-10-CM

## 2015-12-04 DIAGNOSIS — S46001S Unspecified injury of muscle(s) and tendon(s) of the rotator cuff of right shoulder, sequela: Principal | ICD-10-CM

## 2015-12-04 MED ORDER — TAMSULOSIN HCL 0.4 MG PO CAPS
ORAL_CAPSULE | ORAL | Status: DC
Start: 2015-12-04 — End: 2015-12-04

## 2015-12-05 ENCOUNTER — Telehealth: Payer: Self-pay | Admitting: Radiation Oncology

## 2015-12-05 NOTE — Telephone Encounter (Signed)
Phoned patient to inform him that his Flomax script was called in yesterday. No answer. Left message.

## 2016-04-15 ENCOUNTER — Other Ambulatory Visit: Payer: Self-pay | Admitting: Orthopedic Surgery

## 2016-04-16 ENCOUNTER — Other Ambulatory Visit: Payer: Self-pay | Admitting: Orthopedic Surgery

## 2016-04-16 DIAGNOSIS — M25511 Pain in right shoulder: Secondary | ICD-10-CM

## 2016-05-09 NOTE — Pre-Procedure Instructions (Signed)
    William Mcfarland  05/09/2016      [id number] - [place], [place] [name] [address] Phone: [phone] Fax: [phone]    Your procedure is scheduled on 05/21/16.  Report to [facility] Admitting at 530 A.M.  Call this number if you have problems the morning of surgery:  [phone]   Remember:  Do not eat food or drink liquids after midnight.  Take these medicines the morning of surgery with A SIP OF WATER ---none   Do not wear jewelry, make-up or nail polish.  Do not wear lotions, powders, or perfumes.  You may wear deodorant.  Do not shave 48 hours prior to surgery.  Men may shave face and neck.  Do not bring valuables to the hospital.  [facility] is not responsible for any belongings or valuables.  Contacts, dentures or bridgework may not be worn into surgery.  Leave your suitcase in the car.  After surgery it may be brought to your room.  For patients admitted to the hospital, discharge time will be determined by your treatment team.  Patients discharged the day of surgery will not be allowed to drive home.   Name and phone number of your driver:   Special instructions:    Please read over the following fact sheets that you were given. MRSA Information

## 2016-05-10 ENCOUNTER — Encounter (HOSPITAL_COMMUNITY)
Admission: RE | Admit: 2016-05-10 | Discharge: 2016-05-10 | Disposition: A | Discharge status: Home / Self Care | Payer: BC Managed Care – PPO | Source: Ambulatory Visit | Attending: Orthopedic Surgery | Admitting: Orthopedic Surgery

## 2016-05-10 ENCOUNTER — Other Ambulatory Visit: Payer: BC Managed Care – PPO

## 2016-05-10 ENCOUNTER — Encounter (HOSPITAL_COMMUNITY): Payer: Self-pay

## 2016-05-10 ENCOUNTER — Ambulatory Visit
Admission: RE | Admit: 2016-05-10 | Discharge: 2016-05-10 | Disposition: A | Discharge status: Home / Self Care | Payer: BC Managed Care – PPO | Source: Ambulatory Visit | Attending: Orthopedic Surgery | Admitting: Orthopedic Surgery

## 2016-05-10 DIAGNOSIS — M25511 Pain in right shoulder: Secondary | ICD-10-CM

## 2016-05-10 DIAGNOSIS — Z0183 Encounter for blood typing: Secondary | ICD-10-CM | POA: Diagnosis not present

## 2016-05-10 DIAGNOSIS — M19011 Primary osteoarthritis, right shoulder: Secondary | ICD-10-CM | POA: Diagnosis not present

## 2016-05-10 DIAGNOSIS — Z01812 Encounter for preprocedural laboratory examination: Secondary | ICD-10-CM | POA: Insufficient documentation

## 2016-05-10 DIAGNOSIS — I10 Essential (primary) hypertension: Secondary | ICD-10-CM | POA: Insufficient documentation

## 2016-05-10 DIAGNOSIS — Z01818 Encounter for other preprocedural examination: Secondary | ICD-10-CM | POA: Diagnosis present

## 2016-05-10 DIAGNOSIS — R9431 Abnormal electrocardiogram [ECG] [EKG]: Secondary | ICD-10-CM | POA: Diagnosis not present

## 2016-05-10 LAB — URINE MICROSCOPIC-ADD ON
BACTERIA UA: NONE SEEN
SQUAMOUS EPITHELIAL / LPF: NONE SEEN
WBC, UA: NONE SEEN WBC/hpf (ref 0–5)

## 2016-05-10 LAB — URINALYSIS, ROUTINE W REFLEX MICROSCOPIC
BILIRUBIN URINE: NEGATIVE
Glucose, UA: NEGATIVE mg/dL
KETONES UR: NEGATIVE mg/dL
LEUKOCYTES UA: NEGATIVE
NITRITE: NEGATIVE
Protein, ur: NEGATIVE mg/dL
SPECIFIC GRAVITY, URINE: 1.012 (ref 1.005–1.030)
pH: 6.5 (ref 5.0–8.0)

## 2016-05-10 LAB — BASIC METABOLIC PANEL
ANION GAP: 8 (ref 5–15)
BUN: 11 mg/dL (ref 6–20)
CHLORIDE: 104 mmol/L (ref 101–111)
CO2: 26 mmol/L (ref 22–32)
Calcium: 9.7 mg/dL (ref 8.9–10.3)
Creatinine, Ser: 0.68 mg/dL (ref 0.61–1.24)
GFR calc Af Amer: >60 mL/min (ref >60)
GLUCOSE: 105 mg/dL — AB (ref 65–99)
POTASSIUM: 3.9 mmol/L (ref 3.5–5.1)
Sodium: 138 mmol/L (ref 135–145)

## 2016-05-10 LAB — CBC
HEMATOCRIT: 45.2 % (ref 39.0–52.0)
HEMOGLOBIN: 15.7 g/dL (ref 13.0–17.0)
MCH: 30.7 pg (ref 26.0–34.0)
MCHC: 34.7 g/dL (ref 30.0–36.0)
MCV: 88.3 fL (ref 78.0–100.0)
Platelets: 275 10*3/uL (ref 150–400)
RBC: 5.12 MIL/uL (ref 4.22–5.81)
RDW: 12.8 % (ref 11.5–15.5)
WBC: 6.1 10*3/uL (ref 4.0–10.5)

## 2016-05-10 LAB — SURGICAL PCR SCREEN
MRSA, PCR: NEGATIVE
STAPHYLOCOCCUS AUREUS: NEGATIVE

## 2016-05-10 LAB — TYPE AND SCREEN
ABO/RH(D): A POS
ANTIBODY SCREEN: NEGATIVE

## 2016-05-10 LAB — ABO/RH: ABO/RH(D): A POS

## 2016-05-11 LAB — HEMOGLOBIN A1C
Hgb A1c MFr Bld: 6 % — ABNORMAL HIGH (ref 4.8–5.6)
Mean Plasma Glucose: 126 mg/dL

## 2016-05-13 LAB — URINE CULTURE: Culture: NO GROWTH

## 2016-05-20 MED ORDER — CLINDAMYCIN PHOSPHATE 900 MG/50ML IV SOLN
900.0000 mg | INTRAVENOUS | Status: AC
  Administered 2016-05-21: 900 mg via INTRAVENOUS
  Filled 2016-05-20: qty 50

## 2016-05-21 ENCOUNTER — Ambulatory Visit (HOSPITAL_COMMUNITY): Payer: BC Managed Care – PPO | Admitting: Certified Registered"

## 2016-05-21 ENCOUNTER — Observation Stay (HOSPITAL_COMMUNITY): Payer: BC Managed Care – PPO

## 2016-05-21 ENCOUNTER — Encounter (HOSPITAL_COMMUNITY): Payer: Self-pay | Admitting: Surgery

## 2016-05-21 ENCOUNTER — Observation Stay (HOSPITAL_COMMUNITY)
Admission: RE | Admit: 2016-05-21 | Discharge: 2016-05-22 | Disposition: A | Discharge status: Home / Self Care | Payer: BC Managed Care – PPO | Source: Ambulatory Visit | Attending: Orthopedic Surgery | Admitting: Orthopedic Surgery

## 2016-05-21 ENCOUNTER — Encounter (HOSPITAL_COMMUNITY)
Admission: RE | Disposition: A | Discharge status: Home / Self Care | Payer: Self-pay | Source: Ambulatory Visit | Attending: Orthopedic Surgery

## 2016-05-21 DIAGNOSIS — M19011 Primary osteoarthritis, right shoulder: Principal | ICD-10-CM | POA: Insufficient documentation

## 2016-05-21 DIAGNOSIS — I10 Essential (primary) hypertension: Secondary | ICD-10-CM | POA: Insufficient documentation

## 2016-05-21 DIAGNOSIS — Z823 Family history of stroke: Secondary | ICD-10-CM | POA: Diagnosis not present

## 2016-05-21 DIAGNOSIS — Z86010 Personal history of colonic polyps: Secondary | ICD-10-CM | POA: Insufficient documentation

## 2016-05-21 DIAGNOSIS — E119 Type 2 diabetes mellitus without complications: Secondary | ICD-10-CM | POA: Insufficient documentation

## 2016-05-21 DIAGNOSIS — K219 Gastro-esophageal reflux disease without esophagitis: Secondary | ICD-10-CM | POA: Diagnosis not present

## 2016-05-21 DIAGNOSIS — Z923 Personal history of irradiation: Secondary | ICD-10-CM | POA: Diagnosis not present

## 2016-05-21 DIAGNOSIS — M170 Bilateral primary osteoarthritis of knee: Secondary | ICD-10-CM | POA: Insufficient documentation

## 2016-05-21 DIAGNOSIS — Z8546 Personal history of malignant neoplasm of prostate: Secondary | ICD-10-CM | POA: Insufficient documentation

## 2016-05-21 DIAGNOSIS — Z87891 Personal history of nicotine dependence: Secondary | ICD-10-CM | POA: Insufficient documentation

## 2016-05-21 DIAGNOSIS — E785 Hyperlipidemia, unspecified: Secondary | ICD-10-CM | POA: Insufficient documentation

## 2016-05-21 DIAGNOSIS — M160 Bilateral primary osteoarthritis of hip: Secondary | ICD-10-CM | POA: Diagnosis not present

## 2016-05-21 DIAGNOSIS — M19019 Primary osteoarthritis, unspecified shoulder: Secondary | ICD-10-CM

## 2016-05-21 HISTORY — PX: TOTAL SHOULDER ARTHROPLASTY: SHX126

## 2016-05-21 LAB — GLUCOSE, CAPILLARY
Glucose-Capillary: 128 mg/dL — ABNORMAL HIGH (ref 65–99)
Glucose-Capillary: 123 mg/dL — ABNORMAL HIGH (ref 65–99)

## 2016-05-21 SURGERY — ARTHROPLASTY, SHOULDER, TOTAL
Anesthesia: General | Site: Shoulder | Laterality: Right

## 2016-05-21 MED ORDER — ASPIRIN EC 325 MG PO TBEC
325.0000 mg | DELAYED_RELEASE_TABLET | Freq: Every day | ORAL | Status: DC
  Administered 2016-05-22: 325 mg via ORAL
  Filled 2016-05-21: qty 1

## 2016-05-21 MED ORDER — CHLORHEXIDINE GLUCONATE 4 % EX LIQD: 60.0000 mL | Freq: Once | CUTANEOUS | Status: DC

## 2016-05-21 MED ORDER — PHENOL 1.4 % MT LIQD: 1.0000 | OROMUCOSAL | Status: DC | PRN

## 2016-05-21 MED ORDER — POTASSIUM CHLORIDE IN NACL 20-0.9 MEQ/L-% IV SOLN
INTRAVENOUS | Status: DC
  Administered 2016-05-21: 19:00:00 via INTRAVENOUS
  Filled 2016-05-21: qty 1000

## 2016-05-21 MED ORDER — OXYCODONE HCL 5 MG PO TABS
ORAL_TABLET | ORAL | Status: AC
  Administered 2016-05-21: 10 mg via ORAL
  Filled 2016-05-21: qty 2

## 2016-05-21 MED ORDER — SODIUM CHLORIDE 0.9 % IR SOLN
Status: DC | PRN
  Administered 2016-05-21 (×7): 1000 mL

## 2016-05-21 MED ORDER — ACETAMINOPHEN 650 MG RE SUPP: 650.0000 mg | Freq: Four times a day (QID) | RECTAL | Status: DC | PRN

## 2016-05-21 MED ORDER — CEFAZOLIN SODIUM-DEXTROSE 2-4 GM/100ML-% IV SOLN
2.0000 g | Freq: Four times a day (QID) | INTRAVENOUS | Status: AC
  Administered 2016-05-21 – 2016-05-22 (×3): 2 g via INTRAVENOUS
  Filled 2016-05-21 (×3): qty 100

## 2016-05-21 MED ORDER — FENTANYL CITRATE (PF) 250 MCG/5ML IJ SOLN
INTRAMUSCULAR | Status: AC
  Filled 2016-05-21: qty 5

## 2016-05-21 MED ORDER — MIDAZOLAM HCL 2 MG/2ML IJ SOLN
2.0000 mg | Freq: Once | INTRAMUSCULAR | Status: AC
  Administered 2016-05-21: 2 mg via INTRAVENOUS

## 2016-05-21 MED ORDER — DEXAMETHASONE SODIUM PHOSPHATE 10 MG/ML IJ SOLN
INTRAMUSCULAR | Status: AC
  Filled 2016-05-21: qty 1

## 2016-05-21 MED ORDER — ROCURONIUM BROMIDE 100 MG/10ML IV SOLN
INTRAVENOUS | Status: DC | PRN
  Administered 2016-05-21: 10 mg via INTRAVENOUS
  Administered 2016-05-21: 50 mg via INTRAVENOUS

## 2016-05-21 MED ORDER — DEXTROSE 5 % IV SOLN
10.0000 mg | INTRAVENOUS | Status: DC | PRN
  Administered 2016-05-21: 50 ug/min via INTRAVENOUS

## 2016-05-21 MED ORDER — BUPIVACAINE-EPINEPHRINE (PF) 0.5% -1:200000 IJ SOLN
INTRAMUSCULAR | Status: AC
  Filled 2016-05-21: qty 30

## 2016-05-21 MED ORDER — ONDANSETRON HCL 4 MG/2ML IJ SOLN
INTRAMUSCULAR | Status: DC | PRN
  Administered 2016-05-21: 4 mg via INTRAVENOUS

## 2016-05-21 MED ORDER — LACTATED RINGERS IV SOLN
INTRAVENOUS | Status: DC
  Administered 2016-05-21 (×3): via INTRAVENOUS

## 2016-05-21 MED ORDER — PHENYLEPHRINE 40 MCG/ML (10ML) SYRINGE FOR IV PUSH (FOR BLOOD PRESSURE SUPPORT)
PREFILLED_SYRINGE | INTRAVENOUS | Status: AC
  Filled 2016-05-21: qty 10

## 2016-05-21 MED ORDER — ROCURONIUM BROMIDE 50 MG/5ML IV SOLN
INTRAVENOUS | Status: AC
  Filled 2016-05-21: qty 1

## 2016-05-21 MED ORDER — METHOCARBAMOL 1000 MG/10ML IJ SOLN
500.0000 mg | Freq: Four times a day (QID) | INTRAVENOUS | Status: DC | PRN
  Filled 2016-05-21: qty 5

## 2016-05-21 MED ORDER — EPHEDRINE SULFATE 50 MG/ML IJ SOLN
INTRAMUSCULAR | Status: DC | PRN
  Administered 2016-05-21: 5 mg via INTRAVENOUS
  Administered 2016-05-21: 10 mg via INTRAVENOUS
  Administered 2016-05-21 (×3): 5 mg via INTRAVENOUS
  Administered 2016-05-21: 10 mg via INTRAVENOUS

## 2016-05-21 MED ORDER — FENTANYL CITRATE (PF) 100 MCG/2ML IJ SOLN
INTRAMUSCULAR | Status: AC
  Administered 2016-05-21: 100 ug via INTRAVENOUS
  Filled 2016-05-21: qty 2

## 2016-05-21 MED ORDER — OXYCODONE HCL 5 MG PO TABS
5.0000 mg | ORAL_TABLET | ORAL | Status: DC | PRN
  Administered 2016-05-21 – 2016-05-22 (×3): 10 mg via ORAL
  Filled 2016-05-21 (×2): qty 2

## 2016-05-21 MED ORDER — EPHEDRINE 5 MG/ML INJ
INTRAVENOUS | Status: AC
  Filled 2016-05-21: qty 10

## 2016-05-21 MED ORDER — EPINEPHRINE HCL 1 MG/ML IJ SOLN
INTRAMUSCULAR | Status: DC | PRN
  Administered 2016-05-21: .5 mg

## 2016-05-21 MED ORDER — ONDANSETRON HCL 4 MG/2ML IJ SOLN
4.0000 mg | Freq: Four times a day (QID) | INTRAMUSCULAR | Status: DC | PRN
  Administered 2016-05-22: 4 mg via INTRAVENOUS
  Filled 2016-05-21: qty 2

## 2016-05-21 MED ORDER — MENTHOL 3 MG MT LOZG: 1.0000 | LOZENGE | OROMUCOSAL | Status: DC | PRN

## 2016-05-21 MED ORDER — METHOCARBAMOL 500 MG PO TABS
ORAL_TABLET | ORAL | Status: AC
  Administered 2016-05-21: 500 mg via ORAL
  Filled 2016-05-21: qty 1

## 2016-05-21 MED ORDER — MORPHINE SULFATE (PF) 2 MG/ML IV SOLN: 2.0000 mg | INTRAVENOUS | Status: DC | PRN

## 2016-05-21 MED ORDER — ONDANSETRON HCL 4 MG PO TABS: 4.0000 mg | ORAL_TABLET | Freq: Four times a day (QID) | ORAL | Status: DC | PRN

## 2016-05-21 MED ORDER — LISINOPRIL 40 MG PO TABS
40.0000 mg | ORAL_TABLET | Freq: Every morning | ORAL | Status: DC
  Filled 2016-05-21: qty 1

## 2016-05-21 MED ORDER — EPINEPHRINE HCL 1 MG/ML IJ SOLN
INTRAMUSCULAR | Status: AC
  Filled 2016-05-21: qty 1

## 2016-05-21 MED ORDER — SUCCINYLCHOLINE CHLORIDE 20 MG/ML IJ SOLN
INTRAMUSCULAR | Status: DC | PRN
  Administered 2016-05-21: 140 mg via INTRAVENOUS

## 2016-05-21 MED ORDER — SUGAMMADEX SODIUM 200 MG/2ML IV SOLN
INTRAVENOUS | Status: AC
  Filled 2016-05-21: qty 2

## 2016-05-21 MED ORDER — FENTANYL CITRATE (PF) 100 MCG/2ML IJ SOLN
INTRAMUSCULAR | Status: DC | PRN
  Administered 2016-05-21: 150 ug via INTRAVENOUS
  Administered 2016-05-21: 100 ug via INTRAVENOUS

## 2016-05-21 MED ORDER — HYDROMORPHONE HCL 1 MG/ML IJ SOLN: 0.2500 mg | INTRAMUSCULAR | Status: DC | PRN

## 2016-05-21 MED ORDER — DEXAMETHASONE SODIUM PHOSPHATE 10 MG/ML IJ SOLN
INTRAMUSCULAR | Status: DC | PRN
  Administered 2016-05-21: 10 mg via INTRAVENOUS

## 2016-05-21 MED ORDER — ONDANSETRON HCL 4 MG/2ML IJ SOLN
INTRAMUSCULAR | Status: AC
  Filled 2016-05-21: qty 2

## 2016-05-21 MED ORDER — ACETAMINOPHEN 325 MG PO TABS
650.0000 mg | ORAL_TABLET | Freq: Four times a day (QID) | ORAL | Status: DC | PRN
  Administered 2016-05-21: 650 mg via ORAL

## 2016-05-21 MED ORDER — PROPOFOL 10 MG/ML IV BOLUS
INTRAVENOUS | Status: DC | PRN
  Administered 2016-05-21: 130 mg via INTRAVENOUS

## 2016-05-21 MED ORDER — FENTANYL CITRATE (PF) 100 MCG/2ML IJ SOLN
100.0000 ug | Freq: Once | INTRAMUSCULAR | Status: AC
  Administered 2016-05-21: 100 ug via INTRAVENOUS

## 2016-05-21 MED ORDER — LIDOCAINE HCL (CARDIAC) 20 MG/ML IV SOLN
INTRAVENOUS | Status: DC | PRN
  Administered 2016-05-21: 100 mg via INTRAVENOUS

## 2016-05-21 MED ORDER — MEPERIDINE HCL 25 MG/ML IJ SOLN: 6.2500 mg | INTRAMUSCULAR | Status: DC | PRN

## 2016-05-21 MED ORDER — ACETAMINOPHEN 325 MG PO TABS
ORAL_TABLET | ORAL | Status: AC
  Filled 2016-05-21: qty 2

## 2016-05-21 MED ORDER — PROPOFOL 10 MG/ML IV BOLUS
INTRAVENOUS | Status: AC
  Filled 2016-05-21: qty 20

## 2016-05-21 MED ORDER — METOCLOPRAMIDE HCL 5 MG PO TABS: 5.0000 mg | ORAL_TABLET | Freq: Three times a day (TID) | ORAL | Status: DC | PRN

## 2016-05-21 MED ORDER — MIDAZOLAM HCL 2 MG/2ML IJ SOLN
INTRAMUSCULAR | Status: AC
  Administered 2016-05-21: 2 mg via INTRAVENOUS
  Filled 2016-05-21: qty 2

## 2016-05-21 MED ORDER — ONDANSETRON HCL 4 MG/2ML IJ SOLN: 4.0000 mg | Freq: Once | INTRAMUSCULAR | Status: DC | PRN

## 2016-05-21 MED ORDER — BUPIVACAINE-EPINEPHRINE 0.5% -1:200000 IJ SOLN
INTRAMUSCULAR | Status: DC | PRN
  Administered 2016-05-21: 30 mL

## 2016-05-21 MED ORDER — SUGAMMADEX SODIUM 200 MG/2ML IV SOLN
INTRAVENOUS | Status: DC | PRN
  Administered 2016-05-21: 200 mg via INTRAVENOUS

## 2016-05-21 MED ORDER — PHENYLEPHRINE HCL 10 MG/ML IJ SOLN
INTRAMUSCULAR | Status: DC | PRN
  Administered 2016-05-21: 160 ug via INTRAVENOUS
  Administered 2016-05-21: 80 ug via INTRAVENOUS
  Administered 2016-05-21: 120 ug via INTRAVENOUS
  Administered 2016-05-21: 160 ug via INTRAVENOUS

## 2016-05-21 MED ORDER — METOCLOPRAMIDE HCL 5 MG/ML IJ SOLN: 5.0000 mg | Freq: Three times a day (TID) | INTRAMUSCULAR | Status: DC | PRN

## 2016-05-21 MED ORDER — METHOCARBAMOL 500 MG PO TABS
500.0000 mg | ORAL_TABLET | Freq: Four times a day (QID) | ORAL | Status: DC | PRN
  Administered 2016-05-21 – 2016-05-22 (×2): 500 mg via ORAL
  Filled 2016-05-21: qty 1

## 2016-05-21 SURGICAL SUPPLY — 82 items
BLADE SAW SGTL 13X75X1.27 (BLADE) ×3 IMPLANT
CEMENT HV SMART SET (Cement) ×3 IMPLANT
CLOSURE WOUND 1/2 X4 (GAUZE/BANDAGES/DRESSINGS) ×1
COVER SURGICAL LIGHT HANDLE (MISCELLANEOUS) ×3 IMPLANT
COVER TABLE BACK 60X90 (DRAPES) IMPLANT
DRAPE C-ARM 42X72 X-RAY (DRAPES) IMPLANT
DRAPE IMP U-DRAPE 54X76 (DRAPES) ×3 IMPLANT
DRAPE INCISE IOBAN 66X45 STRL (DRAPES) ×6 IMPLANT
DRAPE U-SHAPE 47X51 STRL (DRAPES) ×6 IMPLANT
DRSG AQUACEL AG ADV 3.5X10 (GAUZE/BANDAGES/DRESSINGS) ×3 IMPLANT
DRSG PAD ABDOMINAL 8X10 ST (GAUZE/BANDAGES/DRESSINGS) ×6 IMPLANT
DURAPREP 26ML APPLICATOR (WOUND CARE) ×3 IMPLANT
ELECT BLADE 6.5 EXT (BLADE) IMPLANT
ELECT CAUTERY BLADE 6.4 (BLADE) ×3 IMPLANT
ELECT REM PT RETURN 9FT ADLT (ELECTROSURGICAL) ×3
ELECTRODE REM PT RTRN 9FT ADLT (ELECTROSURGICAL) ×1 IMPLANT
EVACUATOR 1/8 PVC DRAIN (DRAIN) IMPLANT
GAUZE SPONGE 4X4 12PLY STRL (GAUZE/BANDAGES/DRESSINGS) ×3 IMPLANT
GAUZE SPONGE 4X4 16PLY XRAY LF (GAUZE/BANDAGES/DRESSINGS) ×3 IMPLANT
GLENOID VIP GUIDE MODEL 3D (Shoulder) ×3 IMPLANT
GLENOID WITH CLEAT MEDIUM (Shoulder) ×3 IMPLANT
GLOVE BIOGEL PI IND STRL 7.0 (GLOVE) ×2 IMPLANT
GLOVE BIOGEL PI IND STRL 7.5 (GLOVE) ×1 IMPLANT
GLOVE BIOGEL PI IND STRL 8 (GLOVE) ×1 IMPLANT
GLOVE BIOGEL PI INDICATOR 7.0 (GLOVE) ×4
GLOVE BIOGEL PI INDICATOR 7.5 (GLOVE) ×2
GLOVE BIOGEL PI INDICATOR 8 (GLOVE) ×2
GLOVE ECLIPSE 7.0 STRL STRAW (GLOVE) ×3 IMPLANT
GLOVE SURG ORTHO 8.0 STRL STRW (GLOVE) ×3 IMPLANT
GOWN STRL REUS W/ TWL LRG LVL3 (GOWN DISPOSABLE) ×2 IMPLANT
GOWN STRL REUS W/ TWL XL LVL3 (GOWN DISPOSABLE) ×1 IMPLANT
GOWN STRL REUS W/TWL LRG LVL3 (GOWN DISPOSABLE) ×4
GOWN STRL REUS W/TWL XL LVL3 (GOWN DISPOSABLE) ×2
HEAD UNIVERSAL 48/21 (Shoulder) ×3 IMPLANT
KIT BASIN OR (CUSTOM PROCEDURE TRAY) ×3 IMPLANT
KIT BEACH CHAIR TRIMANO (Kit) ×3 IMPLANT
KIT ROOM TURNOVER OR (KITS) ×3 IMPLANT
KIT SET UNIVERSAL (KITS) ×3 IMPLANT
KIT SUTURE APEX UNIVERSE (KITS) ×3 IMPLANT
LOOP VESSEL MAXI BLUE (MISCELLANEOUS) ×3 IMPLANT
MANIFOLD NEPTUNE II (INSTRUMENTS) ×3 IMPLANT
NDL SUT 6 .5 CRC .975X.05 MAYO (NEEDLE) ×1 IMPLANT
NEEDLE HYPO 25GX1X1/2 BEV (NEEDLE) IMPLANT
NEEDLE MAYO TAPER (NEEDLE) ×2
NS IRRIG 1000ML POUR BTL (IV SOLUTION) ×21 IMPLANT
PACK SHOULDER (CUSTOM PROCEDURE TRAY) ×3 IMPLANT
PACK UNIVERSAL I (CUSTOM PROCEDURE TRAY) ×3 IMPLANT
PAD ARMBOARD 7.5X6 YLW CONV (MISCELLANEOUS) ×6 IMPLANT
PASSER SUT SWANSON 36MM LOOP (INSTRUMENTS) ×3 IMPLANT
SLING ARM IMMOBILIZER LRG (SOFTGOODS) ×3 IMPLANT
SMARTMIX MINI TOWER (MISCELLANEOUS) ×3
SPONGE GAUZE 4X4 12PLY STER LF (GAUZE/BANDAGES/DRESSINGS) ×3 IMPLANT
SPONGE LAP 18X18 X RAY DECT (DISPOSABLE) ×6 IMPLANT
SPONGE LAP 4X18 X RAY DECT (DISPOSABLE) ×3 IMPLANT
STEM HUMERAL APEX UNI 9MM (Stem) ×3 IMPLANT
STEM HUMERAL UNI APEX 10MM (Shoulder) ×3 IMPLANT
STRIP CLOSURE SKIN 1/2X4 (GAUZE/BANDAGES/DRESSINGS) ×2 IMPLANT
SUCTION FRAZIER HANDLE 10FR (MISCELLANEOUS) ×2
SUCTION TUBE FRAZIER 10FR DISP (MISCELLANEOUS) ×1 IMPLANT
SUT FIBERWIRE #2 38 T-5 BLUE (SUTURE) ×3
SUT MAXBRAID (SUTURE) IMPLANT
SUT PROLENE 3 0 PS 2 (SUTURE) ×3 IMPLANT
SUT SILK 2 0 (SUTURE) ×2
SUT SILK 2-0 18XBRD TIE 12 (SUTURE) ×1 IMPLANT
SUT VIC AB 0 CT1 27 (SUTURE) ×2
SUT VIC AB 0 CT1 27XBRD ANBCTR (SUTURE) ×1 IMPLANT
SUT VIC AB 0 CTB1 27 (SUTURE) ×9 IMPLANT
SUT VIC AB 1 CT1 27 (SUTURE) ×4
SUT VIC AB 1 CT1 27XBRD ANBCTR (SUTURE) ×2 IMPLANT
SUT VIC AB 2-0 CT1 27 (SUTURE) ×6
SUT VIC AB 2-0 CT1 TAPERPNT 27 (SUTURE) ×3 IMPLANT
SUT VIC AB 3-0 PS2 18 (SUTURE) ×2
SUT VIC AB 3-0 PS2 18XBRD (SUTURE) ×1 IMPLANT
SUTURE FIBERWR #2 38 T-5 BLUE (SUTURE) ×1 IMPLANT
SYR CONTROL 10ML LL (SYRINGE) ×3 IMPLANT
TAPE CLOTH SURG 6X10 WHT LF (GAUZE/BANDAGES/DRESSINGS) ×3 IMPLANT
TOWEL OR 17X24 6PK STRL BLUE (TOWEL DISPOSABLE) ×3 IMPLANT
TOWEL OR 17X26 10 PK STRL BLUE (TOWEL DISPOSABLE) ×3 IMPLANT
TOWER SMARTMIX MINI (MISCELLANEOUS) ×1 IMPLANT
TRAY FOLEY BAG SILVER LF 16FR (CATHETERS) IMPLANT
TRIMANO BEACH CHAIR KIT ×2 IMPLANT
WATER STERILE IRR 1000ML POUR (IV SOLUTION) ×3 IMPLANT

## 2016-05-21 NOTE — Transfer of Care (Signed)
Immediate Anesthesia Transfer of Care Note  Patient: William Mcfarland  Procedure(s) Performed: Procedure(s): TOTAL SHOULDER ARTHROPLASTY (Right)  Patient Location: PACU  Anesthesia Type:GA combined with regional for post-op pain  Level of Consciousness: awake, alert  and oriented  Airway & Oxygen Therapy: Patient Spontanous Breathing and Patient connected to nasal cannula oxygen  Post-op Assessment: Report given to RN and Post -op Vital signs reviewed and stable  Post vital signs: Reviewed and stable  Last Vitals:  Filed Vitals:   05/21/16 1103 05/21/16 1110  BP: 150/71 142/73  Pulse: 93 93  Temp:    Resp: 18 27    Last Pain:  Filed Vitals:   05/21/16 1111  PainSc: 3          Complications: No apparent anesthesia complications

## 2016-05-21 NOTE — Anesthesia Procedure Notes (Addendum)
Procedure Name: Intubation Date/Time: 05/21/2016 12:03 PM Performed by: Salli Quarry WEAVER Pre-anesthesia Checklist: Patient identified, Emergency Drugs available, Suction available and Patient being monitored Patient Re-evaluated:Patient Re-evaluated prior to inductionOxygen Delivery Method: Circle System Utilized Preoxygenation: Pre-oxygenation with 100% oxygen Intubation Type: IV induction Ventilation: Mask ventilation without difficulty Laryngoscope Size: Mac and 4 Grade View: Grade III Tube type: Oral Tube size: 7.0 mm Number of attempts: 1 Airway Equipment and Method: Stylet Placement Confirmation: ETT inserted through vocal cords under direct vision,  positive ETCO2 and breath sounds checked- equal and bilateral Secured at: 22 cm Tube secured with: Tape Dental Injury: Teeth and Oropharynx as per pre-operative assessment    Anesthesia Regional Block:  Interscalene brachial plexus block  Pre-Anesthetic Checklist: ,, timeout performed, Correct Patient, Correct Site, Correct Laterality, Correct Procedure, Correct Position, site marked, Risks and benefits discussed,  Surgical consent,  Pre-op evaluation,  At surgeon's request and post-op pain management  Laterality: Right  Prep: chloraprep       Needles:  Injection technique: Single-shot  Needle Type: Echogenic Stimulator Needle     Needle Length: 10cm 10 cm Needle Gauge: 21 and 21 G    Additional Needles:  Procedures: ultrasound guided (picture in chart) and nerve stimulator Interscalene brachial plexus block  Nerve Stimulator or Paresthesia:  Response: 0.4 mA,   Additional Responses:   Narrative:  Start time: 05/21/2016 10:40 AM End time: 05/21/2016 10:50 AM Injection made incrementally with aspirations every 5 mL.  Performed by: Personally  Anesthesiologist: Lillia Abed  Additional Notes: Monitors applied. Patient sedated. Sterile prep and drape,hand hygiene and sterile gloves were used. Relevant  anatomy identified.Needle position confirmed.Local anesthetic injected incrementally after negative aspiration. Local anesthetic spread visualized around nerve(s). Vascular puncture avoided. No complications. Image printed for medical record.The patient tolerated the procedure well.

## 2016-05-21 NOTE — Anesthesia Postprocedure Evaluation (Signed)
Anesthesia Post Note  Patient: William Mcfarland  Procedure(s) Performed: Procedure(s) (LRB): TOTAL SHOULDER ARTHROPLASTY (Right)  Patient location during evaluation: PACU Anesthesia Type: General Level of consciousness: sedated Pain management: pain level controlled Vital Signs Assessment: post-procedure vital signs reviewed and stable Respiratory status: spontaneous breathing and respiratory function stable Cardiovascular status: stable Anesthetic complications: no    Last Vitals:  Filed Vitals:   05/21/16 1110 05/21/16 1649  BP: 142/73 112/58  Pulse: 93 113  Temp:  36.7 C  Resp: 27 17    Last Pain:  Filed Vitals:   05/21/16 1743  PainSc: 0-No pain                 Theron Cumbie DANIEL

## 2016-05-21 NOTE — Anesthesia Preprocedure Evaluation (Signed)
Anesthesia Evaluation  Patient identified by MRN, date of birth, ID band Patient awake    Reviewed: Allergy & Precautions, NPO status , Patient's Chart, lab work & pertinent test results  Airway Mallampati: I  TM Distance: >3 FB Neck ROM: Full    Dental   Pulmonary former smoker,    Pulmonary exam normal        Cardiovascular hypertension, Pt. on medications Normal cardiovascular exam     Neuro/Psych    GI/Hepatic   Endo/Other  diabetes, Type 2  Renal/GU      Musculoskeletal   Abdominal   Peds  Hematology   Anesthesia Other Findings   Reproductive/Obstetrics                             Anesthesia Physical Anesthesia Plan  ASA: II  Anesthesia Plan: General   Post-op Pain Management: GA combined w/ Regional for post-op pain   Induction: Intravenous  Airway Management Planned: Oral ETT  Additional Equipment:   Intra-op Plan:   Post-operative Plan: Extubation in OR  Informed Consent: I have reviewed the patients History and Physical, chart, labs and discussed the procedure including the risks, benefits and alternatives for the proposed anesthesia with the patient or authorized representative who has indicated his/her understanding and acceptance.     Plan Discussed with: CRNA and Surgeon  Anesthesia Plan Comments:         Anesthesia Quick Evaluation

## 2016-05-21 NOTE — Brief Op Note (Signed)
05/21/2016  4:52 PM  PATIENT:  Lacie Draft  60 y.o. male  PRE-OPERATIVE DIAGNOSIS:  RIGHT SHOULDER OSTEOARTHRITIS  POST-OPERATIVE DIAGNOSIS:  RIGHT SHOULDER OSTEOARTHRITIS  PROCEDURE:  Procedure(s): TOTAL SHOULDER ARTHROPLASTY  SURGEON:  Surgeon(s): Meredith Pel, MD  ASSISTANT: Laure Kidney rnfa  ANESTHESIA:   regional and general  EBL: 200 ml    Total I/O In: 2000 [I.V.:2000] Out: 1100 [Urine:900; Blood:200]  BLOOD ADMINISTERED: none  DRAINS: none   LOCAL MEDICATIONS USED:  none  SPECIMEN:  No Specimen  COUNTS:  YES  TOURNIQUET:  * No tourniquets in log *  DICTATION: .Other Dictation: Dictation Number [id number]  PLAN OF CARE: Admit to inpatient   PATIENT DISPOSITION:  PACU - hemodynamically stable

## 2016-05-21 NOTE — H&P (Signed)
William Mcfarland is an 60 y.o. male.   Chief Complaint: Right shoulder pain HPI: William Mcfarland is a 60 year old patient with right shoulder pain.  He describes long history of right shoulder pain.  The pain interferes with his ADLs and sleep.  Reports course grinding in the shoulder.  Does not report any instability in the shoulder but it is becoming more difficult for him to do his job.  States he has similar problem in the left shoulder but not as severe.  Past Medical History  Diagnosis Date  . GERD (gastroesophageal reflux disease)   . Hypertension   . Hyperlipidemia   . Type 2 diabetes mellitus ([place])   . History of radiation therapy 45 Gy plus seed implants on 04-13-2015    prostate external beam 02-20-2015 to 03-24-2015  . Type 2 diabetes, diet controlled ([place])   . Lower urinary tract symptoms (LUTS)   . Wears glasses   . History of colon polyps   . At risk for sleep apnea     STOP-BANG= 5     SENT TO PCP 04-12-2015  . Prostate cancer [facility]) urologist-  dr ottelin/  oncologist- dr Tammi Klippel    T1c, Gleason 4+3,  PSA 6.83,  vol. 32.56cc--  s/p  external beam radiation 02-20-2015 to 03-24-2015  . Arthritis     shoulders, knees, hips    Past Surgical History  Procedure Laterality Date  . Prostate biopsy    . Orif right ankle fx  1985  . Orif left leg fx  1988  . Colonoscopy w/ polypectomy  2013  . Radioactive seed implant N/A 04/14/2015    Procedure: RADIOACTIVE SEED IMPLANT;  Surgeon: Kathie Rhodes, MD;  Location: [facility];  Service: Urology;  Laterality: N/A;   50     seeds implanted no seeds found in bladder    Family History  Problem Relation Age of Onset  . Stroke Mother   . Stroke Father   . Cancer Neg Hx    Social History:  reports that he quit smoking about 17 years ago. His smoking use included Cigarettes. He has a 7.5 pack-year smoking history. He has never used smokeless tobacco. He reports that he drinks alcohol. He reports that he does not use  illicit drugs.  Allergies: No Known Allergies  No prescriptions prior to admission    No results found for this or any previous visit (from the past 48 hour(s)). No results found.  Review of Systems  Constitutional: Negative.   HENT: Negative.   Eyes: Negative.   Respiratory: Negative.   Cardiovascular: Negative.   Gastrointestinal: Negative.   Genitourinary: Negative.   Musculoskeletal: Positive for joint pain.  Skin: Negative.   Neurological: Negative.   Endo/Heme/Allergies: Negative.   Psychiatric/Behavioral: Negative.     There were no vitals taken for this visit. Physical Exam  Constitutional: He appears well-developed.  HENT:  Head: Normocephalic.  Eyes: Pupils are equal, round, and reactive to light.  Neck: Normal range of motion.  Cardiovascular: Normal rate.   Respiratory: Effort normal.  Neurological: He is alert.  Skin: Skin is warm.  Psychiatric: He has a normal mood and affect.   right shoulder demonstrates good subscap infraspinatus supraspinatus strength.  He has limited external rotation at 15 abduction to about 25.  Isolated glenohumeral forward flexion and abduction and fall short of 90.  Motor sensory function to the hand is intact radial pulses intact skin is intact in the right shoulder girdle region.  Patient  does have some tattoos in the shoulder girdle region  Assessment/Plan Impression is right shoulder arthritis with intact rotator cuff by CT arthrogram preop.  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  Preoperative templating with guide creation has corrected this to 8.  This will still give stable platform for glenoid fixation.  Plan at this time is for total shoulder replacement.  Risks and benefits including not limited to infection nerve vessel damage instability as well as likelihood for revision in his lifetime are all discussed.  Patient stands the risks and benefits and wished to proceed.  All questions answered  Meredith Pel, MD 05/21/2016, 7:27  AM

## 2016-05-22 DIAGNOSIS — M19011 Primary osteoarthritis, right shoulder: Secondary | ICD-10-CM | POA: Diagnosis not present

## 2016-05-22 MED ORDER — OXYCODONE HCL 5 MG PO TABS: 5.0000 mg | ORAL_TABLET | ORAL | Status: DC | PRN

## 2016-05-22 MED ORDER — METHOCARBAMOL 500 MG PO TABS: 500.0000 mg | ORAL_TABLET | Freq: Four times a day (QID) | ORAL | Status: DC | PRN

## 2016-05-22 NOTE — Op Note (Signed)
William Mcfarland, William Mcfarland          ACCOUNT NO.:  [id number]  MEDICAL RECORD NO.:  [id number]  LOCATION:  [id number]                        FACILITY:  [facility]  PHYSICIAN:  Anderson Malta, M.D.    DATE OF BIRTH:  October 18, 1956  DATE OF PROCEDURE: DATE OF DISCHARGE:                              OPERATIVE REPORT   PREOPERATIVE DIAGNOSIS:  Right shoulder arthritis.  POSTOPERATIVE DIAGNOSIS:  Right shoulder arthritis.  PROCEDURE PERFORMED:  Right shoulder replacement using Arthrex components.  medium press-fit/cemented glenoid component  48 x 21 mm modular head.size 10 pressfit stem  SURGEON:  Anderson Malta, M.D.  ASSISTANT:  Laure Kidney, RNFA.  ANESTHESIA:  General plus regional.  INDICATIONS:  William Mcfarland is a patient with right shoulder arthritis, failure to conservative management, presents for operative management after explanation of risks and benefits.  PROCEDURE IN DETAIL:  The patient was brought to operating room where general endotracheal anesthesia was induced.  Preoperative antibiotics were administered.  Time-out was called.  The patient was placed in a beach-chair position with the head in neutral position.  Right arm, hand and axilla prescrubbed with alcohol and Betadine, allowed to air dry, prepped with DuraPrep solution and draped in sterile manner.  Charlie Pitter was used to cover the entire operative field.  Time-out called and the anterior approach to the shoulder was made.  Deltopectoral interval was developed.  Pain mobilized medially.  Adhesions released underneath the deltoid.  Biceps tendon visualized.  Scoville retractor placed with minimal tension on the muscular cutaneous nerve.  Biceps tendon sheath was opened and then the rotator interval was opened all the way to the base of the coracoid process.  The pectoralis tendon was released about 1 cm for about 7 to 8 mm.  Biceps tendon was then tenodesed to the tendon in the bicipital groove.  At this time, the anterior  humeral circumflex vessels were ligated with silk ligatures.  Axillary nerve was then palpated with __________test visualized and a vessel loop was placed around it, it was protected at all times during the case.  At this time, the subscapularis was peeled off the lesser tuberosity with progressive external rotation.  He has subscap and capsule was removed from the humeral neck approximately 200 cm from the articular surface. Osteophyte was present and was removed.  This was taken around about the 5 o'clock position on the right shoulder on the humeral side.  was dislocated.  Humeral canal was entered about a cm posterior to the bicipital groove.  __________ was encountered approximately 30 degrees of retroversion.  This was done at the anatomic neck.  Rotator cuff and axillary nerve were protected.  The stent was then broached up to size 9, which gave excellent press-fit and then a size 8 broach+cap was placed.  At this time, attention was directed towards the glenoid.  The anterior capsule was removed from the 12 o'clock to the 6 o'clock position with care being taken into the axillary nerve.  Subscapularis release was performed.  There was no posterior capsular release from the 6 o'clock to 12 o'clock position and within the posterior capsule.  In course of preoperative templating and custom guide, the guide pin was placed in the center of the  wall.  Primarily, the anterior portion of the glenoid was reamed with some effacement of the worn away eburnated bone surface on the posterior aspect as well.  This was in accordance with preoperative templating.  At this time, the glenoid was drilled and prepared.  Trial glenoid was placed.  The stem was then placed with a 48 x 21 mm head in position.  This gave good stability of about 50% inferior and posterior obstructing.  The patient could reach the top of his head without any posterior instability across his body, had about internal  rotation perpendicular to the torso with 90 degrees of abduction.  At this time, trial components were removed.  Thorough irrigation was performed.  Glenoid cemented into position, except for the central PEG which had bone grafting and was placed in that position. Good fit was obtained.  The inferior keel and superior keel were cemented.  The central PEG was not cemented, but it was filled with bone graft around the screw threads.  At this time, attention was redirected towards the humeral side in accordance with the 9 broach.  A 9 stem was placed, however this did not give adequate press-fit.  This was increased to a size 10, which did give very good press fit.  Same stability was maintained with the 48 x 21 mm head.  The subscap was then reattached after 2 FiberWire sutures placed to the bicipital groove. Four sutures were placed.  Actually in the prosthesis through the trunnion and these 8 strands were passed through the subscap.  Subscap was then tied and secured to the lesser tuberosity and the rotator interval was then closed laterally using 3 #1 Vicryl sutures.  This was done at the arm with about 30 degrees of external rotation.  Stable repair was achieved.  It should be noted that thorough irrigation over 9 liters of irrigating solution was performed at various times during the procedure.  The nerve was then palpated and the vessel loop was removed. Thorough irrigation was performed.  Deltopectoral interval closed using #1 Vicryl suture followed by 0 Vicryl suture, 2-0 Vicryl suture and 3-0 Monocryl.  The patient was placed in a shoulder sling.  He tolerated procedure well without immediate complications.  Transferred to recovery room in stable condition.     Anderson Malta, M.D.     GSD/MEDQ  D:  05/21/2016  T:  05/22/2016  Job:  [id number]

## 2016-05-22 NOTE — Progress Notes (Signed)
Subjective: Pt stable - pain ok   Objective: Vital signs in last 24 hours: Temp:  [97.5 F (36.4 C)-98.2 F (36.8 C)] 97.8 F (36.6 C) (06/07 0506) Pulse Rate:  [76-113] 96 (06/07 0506) Resp:  [14-27] 19 (06/07 0506) BP: (90-151)/(55-82) 125/70 mmHg (06/07 0506) SpO2:  [92 %-99 %] 95 % (06/07 0506) Weight:  [92.08 kg (203 lb)] 92.08 kg (203 lb) (06/06 0916)  Intake/Output from previous day: 06/06 0701 - 06/07 0700 In: 2420 [P.O.:120; I.V.:2300] Out: [id number] [Urine:1575; Blood:200] Intake/Output this shift: Total I/O In: 120 [P.O.:120] Out: -   Exam:  Sensation intact distally Intact pulses distally  Labs: No results for input(s): HGB in the last 72 hours. No results for input(s): WBC, RBC, HCT, PLT in the last 72 hours. No results for input(s): NA, K, CL, CO2, BUN, CREATININE, GLUCOSE, CALCIUM in the last 72 hours. No results for input(s): LABPT, INR in the last 72 hours.  Assessment/Plan: Plan ot then dc today   DEAN,GREGORY SCOTT 05/22/2016, 8:16 AM

## 2016-05-22 NOTE — Progress Notes (Signed)
Pt. Stated he was in so much pain. I called the RN assigned to the Room . Pt. Stated he did not wanted to eat right now

## 2016-05-22 NOTE — Evaluation (Addendum)
Occupational Therapy Evaluation Patient Details Name: William Mcfarland MRN: [id number] DOB: Mar 12, 1956 Today's Date: 05/22/2016    History of Present Illness Pt with R TSA   Clinical Impression   Pt provided with education for shoulder protocol for sling wear, ADL techniques, positioning and precautions/restrictions. Pt educated on R UE exercises and ordered by MD for pendulums, FF, ABD and ER. ROM of elbow, wrist and hand; pt able to return demonstrate. Pt provided with written and visual handouts for d/c. Pt expected to d/c home this afternoon and f/u with MD in approximately 10-14 days. Pt states that although he lives alone, he has friends that can provide any assist prn and transportation    Follow Up Recommendations  No OT follow up;Outpatient OT (progress rehab of shoulder per MD when appropriate)    Equipment Recommendations  None recommended by OT    Recommendations for Other Services  none     Precautions / Restrictions Precautions Precautions: Shoulder Type of Shoulder Precautions: NWB, no ROM Shoulder Interventions: Shoulder sling/immobilizer Precaution Booklet Issued: Yes (comment) Precaution Comments: handouts for sling, ADLs, positioning, exercises presribed by MD. shoulder FF 0-90, abduction 0-60, ER 0-30 Required Braces or Orthoses: Sling Restrictions Weight Bearing Restrictions: Yes RUE Weight Bearing: Non weight bearing Other Position/Activity Restrictions: handouts for sling, ADLs, positioning, exercises presribed by MD      Mobility Bed Mobility Overal bed mobility: Independent                Transfers Overall transfer level: Independent Equipment used: None                  Balance Overall balance assessment: No apparent balance deficits (not formally assessed)                                          ADL Overall ADL's : Modified independent                                       General ADL  Comments: pt able to doff/donn sling, donn/doff shirt  and pants with supervision. independent with toileting     Vision  reading glasses, no change from baseline              Pertinent Vitals/Pain Pain Assessment: 0-10 Pain Score: 3  Pain Location: R shoulder Pain Descriptors / Indicators: Sore Pain Intervention(s): Monitored during session;Premedicated before session;Repositioned     Hand Dominance Right   Extremity/Trunk Assessment Upper Extremity Assessment Upper Extremity Assessment: RUE deficits/detail RUE Deficits / Details: sling, NWB, ROM of elbow and forearm. shoulder FF 0-90, abduction 0-60, ER 0-30 RUE: Unable to fully assess due to immobilization       Cervical / Trunk Assessment Cervical / Trunk Assessment: Normal   Communication Communication Communication: No difficulties   Cognition Arousal/Alertness: Awake/alert Behavior During Therapy: WFL for tasks assessed/performed Overall Cognitive Status: Within Functional Limits for tasks assessed                     General Comments   pt pleasant and cooperative           Shoulder Instructions Shoulder Instructions Donning/doffing shirt without moving shoulder: Modified independent Method for sponge bathing under operated UE: Modified independent Donning/doffing sling/immobilizer: Modified independent Correct positioning of sling/immobilizer: Modified  independent Pendulum exercises (written home exercise program): Modified independent ROM for elbow, wrist and digits of operated UE: Modified independent Sling wearing schedule (on at all times/off for ADL's): Modified independent Proper positioning of operated UE when showering: Modified independent Positioning of UE while sleeping: Modified independent    Home Living Family/patient expects to be discharged to:: Private residence Living Arrangements: Alone Available Help at Discharge: Friend(s) Type of Home: Apartment Home Access: Level  entry     Home Layout: One level     Bathroom Shower/Tub: Teacher, early years/pre: Standard     Home Equipment: None          Prior Functioning/Environment Level of Independence: Independent             OT Diagnosis: Acute pain   OT Problem List: Impaired UE functional use;Pain         OT Goals(Current goals can be found in the care plan section) Acute Rehab OT Goals Patient Stated Goal: go home today       Barriers to D/C:  none                        End of Session Equipment Utilized During Treatment: Other (comment) (R UE sling)  Activity Tolerance: Patient tolerated treatment well Patient left: in chair;with call bell/phone within reach   Time: 1029-1120 OT Time Calculation (min): 51 min Charges:  OT Evaluation $OT Eval Low Complexity: 1 Procedure $OT Eval Moderate Complexity: 1 Procedure OT Treatments $Self Care/Home Management : 8-22 mins $Therapeutic Exercise: 8-22 mins G-Codes: OT G-codes **NOT FOR INPATIENT CLASS** Functional Assessment Tool Used: clinical judgement Functional Limitation: Self care Self Care Current Status [id number]): At least 1 percent but less than 20 percent impaired, limited or restricted Self Care Goal Status [id number]): At least 1 percent but less than 20 percent impaired, limited or restricted Self Care Discharge Status [phone]): At least 1 percent but less than 20 percent impaired, limited or restricted  Britt Bottom 05/22/2016, 11:36 AM

## 2016-05-22 NOTE — Progress Notes (Signed)
Offered pt. A bath but pt. Stated he was in so much pain that maybe he will wash up later on. I stated to the Pt. When he was ready to let me know

## 2016-05-23 ENCOUNTER — Encounter (HOSPITAL_COMMUNITY): Payer: Self-pay | Admitting: Orthopedic Surgery

## 2016-05-30 ENCOUNTER — Encounter (HOSPITAL_COMMUNITY): Payer: Self-pay | Admitting: Orthopedic Surgery

## 2016-05-31 NOTE — Discharge Summary (Signed)
Physician Discharge Summary  Patient ID: GAHEL SIDMAN MRN: [id number] DOB/AGE: 1956-07-10 60 y.o.  Admit date: 05/21/2016 Discharge date: 05/22/2016  Admission Diagnoses:  Active Problems:   Degenerative arthritis of shoulder region   Discharge Diagnoses:  Same  Surgeries: Procedure(s): TOTAL SHOULDER ARTHROPLASTY on 05/21/2016   Consultants:    Discharged Condition: Stable  Hospital Course: JEMERY UMSTEAD is an 60 y.o. male who was admitted 05/21/2016 with a chief complaint of right shoulder arthritis, and found to have a diagnosis of end stage right shoulder arthritis.  They were brought to the operating room on 05/21/2016 and underwent the above named procedures. He tolerated the procedure well and was seen by physical therapy postop day #1. Exercises reviewed and encouraged. Patient will follow up with me in 1 week. Pain controlled at the time of discharge   Antibiotics given:  Anti-infectives    Start     Dose/Rate Route Frequency Ordered Stop   05/21/16 2100  ceFAZolin (ANCEF) IVPB 2g/100 mL premix     2 g 200 mL/hr over 30 Minutes Intravenous Every 6 hours 05/21/16 1852 05/22/16 1007   05/21/16 1030  clindamycin (CLEOCIN) IVPB 900 mg     900 mg 100 mL/hr over 30 Minutes Intravenous To [facility] 05/20/16 1330 05/21/16 1243    .  Recent vital signs:  Filed Vitals:   05/22/16 0506 05/22/16 0944  BP: 125/70 79/47  Pulse: 96 60  Temp: 97.8 F (36.6 C)   Resp: 19 20    Recent laboratory studies:  Results for orders placed or performed during the hospital encounter of 05/21/16  Glucose, capillary  Result Value Ref Range   Glucose-Capillary 128 (H) 65 - 99 mg/dL  Glucose, capillary  Result Value Ref Range   Glucose-Capillary 123 (H) 65 - 99 mg/dL   Comment 1 Notify RN    Comment 2 Document in Chart     Discharge Medications:     Medication List    TAKE these medications        lisinopril 40 MG tablet  Commonly known as:   PRINIVIL,ZESTRIL  Take 40 mg by mouth every morning.     methocarbamol 500 MG tablet  Commonly known as:  ROBAXIN  Take 1 tablet (500 mg total) by mouth every 6 (six) hours as needed for muscle spasms.     naproxen sodium 220 MG tablet  Commonly known as:  ANAPROX  Take 220 mg by mouth 2 (two) times daily as needed. For pain     oxyCODONE 5 MG immediate release tablet  Commonly known as:  Oxy IR/ROXICODONE  Take 1-2 tablets (5-10 mg total) by mouth every 3 (three) hours as needed for breakthrough pain.     tetrahydrozoline 0.05 % ophthalmic solution  Place 1 drop into both eyes 2 (two) times daily as needed.        Diagnostic Studies: Dg Chest 2 View  05/10/2016  CLINICAL DATA:  GERD, hypertension, diabetes. Preoperative evaluation prior to shoulder arthroplasty. EXAM: CHEST  2 VIEW COMPARISON:  Chest x-ray dated 03/06/2015. FINDINGS: Heart size is normal. Overall cardiomediastinal silhouette is stable in size and configuration. Lungs are clear. Degenerative changes noted at both shoulder joints. Degenerative spurring is again seen throughout the thoracic spine. No acute -appearing osseous abnormality. IMPRESSION: Lungs are clear and there is no evidence of acute cardiopulmonary abnormality. Electronically Signed   By: Franki Cabot M.D.   On: 05/10/2016 08:56   Ct Shoulder Right Wo Contrast  05/10/2016  CLINICAL DATA:  Pre-op for total shoulder arthroplasty. Decreased range of motion with pain and popping for 3 years. No acute injury. History of prostate cancer. EXAM: CT OF THE RIGHT SHOULDER WITHOUT CONTRAST TECHNIQUE: Multidetector CT imaging was performed according to the standard protocol. Multiplanar CT image reconstructions were also generated. COMPARISON:  CT 12/04/2015. FINDINGS: Bones: No evidence of acute fracture, dislocation or humeral head avascular necrosis. Joint/cartilage: Advanced glenohumeral degenerative changes are again noted with prominent osteophytes of the humeral  head and glenoid. There is subchondral sclerosis and cyst formation within the glenoid. There is a small shoulder joint effusion with most of the fluid in the superior subscapularis recess. No loose bodies are observed. There are stable mild acromioclavicular degenerative changes. Ligaments: Not applicable for exam/indication. Tendon/muscles: There is stable mild narrowing of the subacromial space and mild supraspinatus muscular atrophy. No acute vascular findings. Neurovascular/other soft tissues: None. IMPRESSION: 1. Stable advanced glenohumeral degenerative changes. 2. No acute osseous findings or evidence of loose body. 3. Stable mild narrowing of the subacromial space with mild supraspinatus muscular atrophy. Electronically Signed   By: Richardean Sale M.D.   On: 05/10/2016 15:04   Dg Shoulder Right Port  05/21/2016  CLINICAL DATA:  Right shoulder replacement. EXAM: PORTABLE RIGHT SHOULDER - 2+ VIEW COMPARISON:  CT of the shoulder 05/10/2016 FINDINGS: There has been a right humeral head arthroplasty with normal alignment of the prosthetic humeral head to the glenoid. Osteoarthritic changes of the glenoid are seen. There also stable osteoarthritic changes of the acromioclavicular joint. There is no evidence of fracture or dislocation. Soft tissues are unremarkable. IMPRESSION: Status post right shoulder arthroplasty, without evidence of complications. Electronically Signed   By: Fidela Salisbury M.D.   On: 05/21/2016 18:27    Disposition: 01-Home or Self Care      Discharge Instructions    Call MD / Call 911    Complete by:  As directed   If you experience chest pain or shortness of breath, CALL 911 and be transported to the hospital emergency room.  If you develope a fever above 101 F, pus (white drainage) or increased drainage or redness at the wound, or calf pain, call your surgeon's office.     Call MD / Call 911    Complete by:  As directed   If you experience chest pain or shortness of  breath, CALL 911 and be transported to the hospital emergency room.  If you develope a fever above 101 F, pus (white drainage) or increased drainage or redness at the wound, or calf pain, call your surgeon's office.     Constipation Prevention    Complete by:  As directed   Drink plenty of fluids.  Prune juice may be helpful.  You may use a stool softener, such as Colace (over the counter) 100 mg twice a day.  Use MiraLax (over the counter) for constipation as needed.     Constipation Prevention    Complete by:  As directed   Drink plenty of fluids.  Prune juice may be helpful.  You may use a stool softener, such as Colace (over the counter) 100 mg twice a day.  Use MiraLax (over the counter) for constipation as needed.     Diet - low sodium heart healthy    Complete by:  As directed      Diet - low sodium heart healthy    Complete by:  As directed      Discharge instructions    Complete  by:  As directed   Ok to shower cpm 1 hour two times a day for first week Sling for comfort     Increase activity slowly as tolerated    Complete by:  As directed      Increase activity slowly as tolerated    Complete by:  As directed               Signed: DEAN,GREGORY SCOTT 05/31/2016, 4:44 PM

## 2016-11-29 ENCOUNTER — Telehealth (INDEPENDENT_AMBULATORY_CARE_PROVIDER_SITE_OTHER): Payer: Self-pay | Admitting: Orthopedic Surgery

## 2016-11-29 NOTE — Telephone Encounter (Signed)
Pt asking if he needs to be on antibiotics prior to a dental appt? Stated he had surgery a couple months ago. Please advise. Pt said please leave message informing if he doesn't answer  [phone]

## 2016-11-29 NOTE — Telephone Encounter (Signed)
Yes clinda 600 po 1 hour before pls clal thx

## 2016-11-29 NOTE — Telephone Encounter (Signed)
Patient is allergic to amoxicillin, pls advise on abx to send in?  Pt uses [facility] on [address] and [address].

## 2016-12-02 MED ORDER — CLINDAMYCIN HCL 300 MG PO CAPS: ORAL_CAPSULE | ORAL | 1 refills | Status: DC

## 2016-12-02 NOTE — Addendum Note (Signed)
Addended byBrand Males on: 12/02/2016 08:47 AM   Modules accepted: Orders

## 2016-12-02 NOTE — Telephone Encounter (Signed)
Rx sent to pt's pharmacy

## 2017-06-23 DIAGNOSIS — S61240A Puncture wound with foreign body of right index finger without damage to nail, initial encounter: Secondary | ICD-10-CM

## 2017-06-23 NOTE — ED Triage Notes (Signed)
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

## 2017-06-23 NOTE — ED Provider Notes (Signed)
[facility]   EMERGENCY DEPARTMENT VISIT NOTE     History and Physical     Name: Glenn Burgess  DOB: [birth year] 61 y.o.  MRN: [id number]  CSN: [id number]    HISTORY:     CHIEF COMPLAINT    Fishhook in left index finger  HPI    Deuntae Kocsis is a 61 y.o. male who is awake alert cooperative.  Patient was in a local store and says he was reaching for something and got his finger stuck on a fishhook. This was a trouble hook that was on a splinter. He has been unable to remove it and so has come here for further evaluation. He did clip the 2 barbs off that were not in his skin but he left the Barb in the skin in place and did not clip that one off.    REVIEW OF SYSTEMS:  Please see HPI.  All other systems were reviewed and are negative.    PAST MEDICAL HISTORY    History reviewed. No pertinent past medical history.    SURGICAL HISTORY    No past surgical history on file.     HOME MEDICATION LIST    Home Medications     No medications reported.          ALLERGIES    Patient has no known allergies.    FAMILY HISTORY    History reviewed. No pertinent family history.    SOCIAL HISTORY    Social History   Substance Use Topics   . Smoking status: Not on file   . Smokeless tobacco: Not on file   . Alcohol use Not on file         PHYSICAL EXAM:   INITIAL VITAL SIGNS:    Initial Vitals [06/23/17 1751]   BP 113/73   Pulse 96   Resp 16   Temp 36.5 ?C (97.7 ?F)   SpO2 98 %       General: Male in bicycle gear and helmet awake alert cooperative.  Extremities: The right index finger has a hook attached to a little were going into the skin over the middle phalanx. in the skin. Warm, well perfused.  Calves symmetrical and non-tender. No edema.  Skin:  Fishhook as above.  Neurologic:  Alert & oriented x 3, nonfocal exam.   Psychiatric: Mood and affect normal.     Medications given in ED (for complete list see nursing notes):  Medications   lidocaine (PF) 1 % (XYLOCAINE) injection 0-10 mL (not  administered)       ED COURSE & MEDICAL DECISION MAKING    Data results reviewed. Nursing note reviewed.   The wound was infiltrated with 1% lidocaine. A small incision was made along the shank of the hook and the hook was easily removed without difficulty. Patient tolerated well.    Patient does not need any suturing and we can treat him with just a Band-Aid.    Patient is discharged home in stable condition. He is to return as needed.    DISPOSITION:  As above.    CLINICAL IMPRESSION:       SNOMED CT(R)   1. Skin foreign body  FOREIGN BODY IN SKIN         **This document was created with the assistance of voice-to-text technology. Effort has been made to minimize transcription errors. Please allow for homonyms and other similar transcription errors, such as she in place of he and vice versa.**  Conrad Burlington, MD  06/23/17 2001

## 2017-06-23 NOTE — ED Notes (Signed)
Pt ambulatory to ER hallway bed with strong, steady gait. NAD. Pt reports he has been trying to wiggle fish hook from right index finger but he has been unsuccessful.

## 2017-06-24 ENCOUNTER — Inpatient Hospital Stay: Admit: 2017-06-24 | Discharge: 2017-06-24 | Disposition: A | Discharge status: Home / Self Care

## 2017-06-24 LAB — ED INFORMATION EXCHANGE

## 2017-06-24 MED ORDER — lidocaine (PF) 1 % (XYLOCAINE) injection 0-10 mL
10 | INTRAMUSCULAR | Status: DC | PRN
Start: 2017-06-24 — End: 2017-06-23

## 2017-06-24 MED FILL — LIDOCAINE (PF) 10 MG/ML (1 %) INJECTION SOLUTION: 10 mg/mL (1 %) | INTRAMUSCULAR | Qty: 5

## 2017-07-11 ENCOUNTER — Ambulatory Visit: Payer: BC Managed Care – PPO | Admitting: Podiatry

## 2017-07-30 ENCOUNTER — Ambulatory Visit (INDEPENDENT_AMBULATORY_CARE_PROVIDER_SITE_OTHER): Payer: Self-pay | Admitting: Orthopedic Surgery

## 2017-08-06 ENCOUNTER — Ambulatory Visit (INDEPENDENT_AMBULATORY_CARE_PROVIDER_SITE_OTHER): Payer: Self-pay | Admitting: Orthopedic Surgery

## 2018-04-20 ENCOUNTER — Ambulatory Visit (INDEPENDENT_AMBULATORY_CARE_PROVIDER_SITE_OTHER): Payer: BC Managed Care – PPO | Admitting: Orthopedic Surgery

## 2018-04-20 ENCOUNTER — Ambulatory Visit (INDEPENDENT_AMBULATORY_CARE_PROVIDER_SITE_OTHER): Payer: BC Managed Care – PPO

## 2018-04-20 ENCOUNTER — Encounter (INDEPENDENT_AMBULATORY_CARE_PROVIDER_SITE_OTHER): Payer: Self-pay | Admitting: Orthopedic Surgery

## 2018-04-20 DIAGNOSIS — M25512 Pain in left shoulder: Secondary | ICD-10-CM

## 2018-04-21 ENCOUNTER — Encounter (INDEPENDENT_AMBULATORY_CARE_PROVIDER_SITE_OTHER): Payer: Self-pay | Admitting: Orthopedic Surgery

## 2018-04-21 NOTE — Progress Notes (Signed)
Office Visit Note   Patient: William Mcfarland           Date of Birth: 10-30-1956           MRN: [id number] Visit Date: 04/20/2018 Requested by: Kathyrn Lass, [place], [place] [address] PCP: Kathyrn Lass, MD  Subjective: Chief Complaint  Patient presents with  . Left Shoulder - Pain    HPI: William Mcfarland is a patient with left shoulder pain.  He had right total shoulder replacement done in June 2017.  He has done very well with that procedure.  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  The left shoulder pain however wakes him from sleep at night.  He did reports increased grinding in that left shoulder.  Takes Aleve as needed.              ROS: All systems reviewed are negative as they relate to the chief complaint within the history of present illness.  Patient denies  fevers or chills.   Assessment & Plan: Visit Diagnoses:  1. Left shoulder pain, unspecified chronicity     Plan: Impression is left shoulder pain with glenohumeral arthritis present.  Plan is intervention consisting of episodic injections when the symptoms warrant.  Right now he is not really at that place.  His glenoid wear does not appear severe at this time.  I think he would be a good candidate for total shoulder replacement in the future when his symptoms worsen.  I will see him back for glenohumeral joint injection when his symptoms worsen.  He does plan to take [facility] again this year.  Follow-Up Instructions: Return if symptoms worsen or fail to improve.   Orders:  Orders Placed This Encounter  Procedures  . XR Shoulder Left   No orders of the defined types were placed in this encounter.     Procedures: No procedures performed   Clinical Data: No additional findings.  Objective: Vital Signs: There were no vitals taken for this visit.  Physical Exam:   Constitutional: Patient appears well-developed HEENT:  Head:  Normocephalic Eyes:EOM are normal Neck: Normal range of motion Cardiovascular: Normal rate Pulmonary/chest: Effort normal Neurologic: Patient is alert Skin: Skin is warm Psychiatric: Patient has normal mood and affect    Ortho Exam: Orthopedic exam demonstrates good cervical spine range of motion.  Right shoulder has nearly full forward flexion and abduction.  Good strength.  Left shoulder demonstrates forward flexion to about 120 and isolated variable abduction below 90.  External rotation of 50 degrees of abduction is about 30 degrees as well.  There is a little bit of coarseness and grinding but rotator cuff strength is intact to infraspinatus supraspinatus and subscap muscle testing.  No other masses lymphadenopathy or skin changes noted in the shoulder girdle region  Specialty Comments:  No specialty comments available.  Imaging: No results found.   PMFS History: Patient Active Problem List   Diagnosis Date Noted  . Degenerative arthritis of shoulder region 05/21/2016  . Malignant neoplasm of prostate ([place]) 11/20/2014  . Travel advice encounter 04/01/2014   Past Medical History:  Diagnosis Date  . Arthritis    shoulders, knees, hips  . At risk for sleep apnea    STOP-BANG= 5     SENT TO PCP 04-12-2015  . GERD (gastroesophageal reflux disease)   . History of colon polyps   . History of radiation therapy 45 Gy plus seed implants on  04-13-2015   prostate external beam 02-20-2015 to 03-24-2015  . Hyperlipidemia   . Hypertension   . Lower urinary tract symptoms (LUTS)   . Prostate cancer [facility]) urologist-  dr ottelin/  oncologist- dr Tammi Klippel   T1c, Gleason 4+3,  PSA 6.83,  vol. 32.56cc--  s/p  external beam radiation 02-20-2015 to 03-24-2015  . Type 2 diabetes mellitus ([place])   . Type 2 diabetes, diet controlled ([place])   . Wears glasses     Family History  Problem Relation Age of Onset  . Stroke Mother   . Stroke Father   . Cancer Neg Hx     Past Surgical History:   Procedure Laterality Date  . COLONOSCOPY W/ POLYPECTOMY  2013  . ORIF LEFT LEG FX  1988  . ORIF RIGHT ANKLE FX  1985  . PROSTATE BIOPSY    . RADIOACTIVE SEED IMPLANT N/A 04/14/2015   Procedure: RADIOACTIVE SEED IMPLANT;  Surgeon: Kathie Rhodes, MD;  Location: [facility];  Service: Urology;  Laterality: N/A;   64     seeds implanted no seeds found in bladder  . TOTAL SHOULDER ARTHROPLASTY Right 05/21/2016   Procedure: TOTAL SHOULDER ARTHROPLASTY;  Surgeon: Meredith Pel, MD;  Location: [place];  Service: Orthopedics;  Laterality: Right;   Social History   Occupational History  . Not on file  Tobacco Use  . Smoking status: Former Smoker    Packs/day: 0.50    Years: 15.00    Pack years: 7.50    Types: Cigarettes    Last attempt to quit: 04/12/1999    Years since quitting: 19.0  . Smokeless tobacco: Never Used  Substance and Sexual Activity  . Alcohol use: Yes    Comment: occasional  . Drug use: No  . Sexual activity: Not on file

## 2018-12-21 ENCOUNTER — Encounter (INDEPENDENT_AMBULATORY_CARE_PROVIDER_SITE_OTHER): Payer: Self-pay | Admitting: Orthopedic Surgery

## 2018-12-21 ENCOUNTER — Ambulatory Visit (INDEPENDENT_AMBULATORY_CARE_PROVIDER_SITE_OTHER): Payer: BC Managed Care – PPO | Admitting: Orthopedic Surgery

## 2018-12-21 DIAGNOSIS — M19012 Primary osteoarthritis, left shoulder: Secondary | ICD-10-CM

## 2018-12-23 ENCOUNTER — Encounter (INDEPENDENT_AMBULATORY_CARE_PROVIDER_SITE_OTHER): Payer: Self-pay | Admitting: Orthopedic Surgery

## 2018-12-23 NOTE — Progress Notes (Signed)
Office Visit Note   Patient: William Mcfarland           Date of Birth: 09/29/1956           MRN: [id number] Visit Date: 12/21/2018 Requested by: William Mcfarland, [place], [place] [id number] PCP: William Lass, MD  Subjective: Chief Complaint  Patient presents with  . Left Shoulder - Pain, Follow-up    HPI: William Mcfarland is a patient with left shoulder arthritis.  Known history of arthritis in both shoulders.  He is done well with his right shoulder replacement.  He is able to do his job.  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  He is considering retirement within the next several years.  He does go on vacations and goes on safari is where he can interact with lines.  He reports continuing pain in that right shoulder which is becoming more severe.  He has taken medication and had injections in the past which have helped him some.              ROS: All systems reviewed are negative as they relate to the chief complaint within the history of present illness.  Patient denies  fevers or chills.   Assessment & Plan: Visit Diagnoses:  1. Arthritis of left shoulder region     Plan: Impression is left shoulder pain from known glenohumeral arthritis.  Per patient request glenohumeral injections performed today.  He may consider shoulder replacement in 4 to 5 months depending on how he does with this injection.  I will see him back as needed.  Follow-Up Instructions: Return if symptoms worsen or fail to improve.   Orders:  No orders of the defined types were placed in this encounter.  No orders of the defined types were placed in this encounter.     Procedures: No procedures performed   Clinical Data: No additional findings.  Objective: Vital Signs: There were no vitals taken for this visit.  Physical Exam:   Constitutional: Patient appears well-developed HEENT:  Head: Normocephalic Eyes:EOM are normal Neck: Normal range of motion Cardiovascular: Normal  rate Pulmonary/chest: Effort normal Neurologic: Patient is alert Skin: Skin is warm Psychiatric: Patient has normal mood and affect    Ortho Exam: Ortho exam demonstrates full active and passive range of motion of the cervical spine.  He has good grip strength in both hands.  Left shoulder demonstrates external rotation of 15 degrees of abduction to about 20 degrees.  He does have good rotator cuff strength isolated infraspinatus supraspinatus and subscap muscle testing.  Does have limited forward flexion and abduction both below 90 degrees.  No masses lymphadenopathy or skin changes noted in that left shoulder girdle region.  Does have a lion tattoo on the left shoulder.  Specialty Comments:  No specialty comments available.  Imaging: No results found.   PMFS History: Patient Active Problem List   Diagnosis Date Noted  . Degenerative arthritis of shoulder region 05/21/2016  . Malignant neoplasm of prostate ([facility] Mc[facility]) 11/20/2014  . Travel advice encounter 04/01/2014   Past Medical History:  Diagnosis Date  . Arthritis    shoulders, knees, hips  . At risk for sleep apnea    STOP-BANG= 5     SENT TO PCP 04-12-2015  . GERD (gastroesophageal reflux disease)   . History of colon polyps   . History of radiation therapy 45 Gy plus seed implants on 04-13-2015   prostate external beam 02-20-2015 to 03-24-2015  . Hyperlipidemia   .  Hypertension   . Lower urinary tract symptoms (LUTS)   . Prostate cancer [facility]) urologist-  William Mcfarland/  oncologist- William William Mcfarland   T1c, Gleason 4+3,  PSA 6.83,  vol. 32.56cc--  s/p  external beam radiation 02-20-2015 to 03-24-2015  . Type 2 diabetes mellitus (William Mcfarland)   . Type 2 diabetes, diet controlled ([facility] Mcfarland)   . Wears glasses     Family History  Problem Relation Age of Onset  . Stroke Mother   . Stroke Father   . Cancer Neg Hx     Past Surgical History:  Procedure Laterality Date  . COLONOSCOPY W/ POLYPECTOMY  2013  . ORIF LEFT LEG FX  1988  . ORIF RIGHT  ANKLE FX  1985  . PROSTATE BIOPSY    . RADIOACTIVE SEED IMPLANT N/A 04/14/2015   Procedure: RADIOACTIVE SEED IMPLANT;  Surgeon: William Rhodes, MD;  Location: [facility];  Service: Urology;  Laterality: N/A;   64     seeds implanted no seeds found in bladder  . TOTAL SHOULDER ARTHROPLASTY Right 05/21/2016   Procedure: TOTAL SHOULDER ARTHROPLASTY;  Surgeon: William Pel, MD;  Location: [facility];  Service: Orthopedics;  Laterality: Right;   Social History   Occupational History  . Not on file  Tobacco Use  . Smoking status: Former Smoker    Packs/day: 0.50    Years: 15.00    Pack years: 7.50    Types: Cigarettes    Last attempt to quit: 04/12/1999    Years since quitting: 19.7  . Smokeless tobacco: Never Used  Substance and Sexual Activity  . Alcohol use: Yes    Comment: occasional  . Drug use: No  . Sexual activity: Not on file

## 2019-03-12 ENCOUNTER — Telehealth (INDEPENDENT_AMBULATORY_CARE_PROVIDER_SITE_OTHER): Payer: Self-pay | Admitting: Radiology

## 2019-03-12 NOTE — Telephone Encounter (Signed)
Pt answered "no" to all questions °

## 2019-03-12 NOTE — Telephone Encounter (Signed)
Called patient and left voicemail asking him to call us back to answer pre screening questions for appointment on 3/30

## 2019-03-15 ENCOUNTER — Other Ambulatory Visit: Payer: Self-pay

## 2019-03-15 ENCOUNTER — Encounter (INDEPENDENT_AMBULATORY_CARE_PROVIDER_SITE_OTHER): Payer: Self-pay | Admitting: Orthopedic Surgery

## 2019-03-15 ENCOUNTER — Ambulatory Visit (INDEPENDENT_AMBULATORY_CARE_PROVIDER_SITE_OTHER): Payer: BC Managed Care – PPO | Admitting: Orthopedic Surgery

## 2019-03-15 DIAGNOSIS — M19012 Primary osteoarthritis, left shoulder: Secondary | ICD-10-CM

## 2019-03-15 MED ORDER — TRAMADOL HCL 50 MG PO TABS: ORAL_TABLET | ORAL | 0 refills | Status: DC

## 2019-03-15 NOTE — Progress Notes (Signed)
Office Visit Note   Patient: William Mcfarland           Date of Birth: 01-Feb-1956           MRN: [id number] Visit Date: 03/15/2019 Requested by: Kathyrn Lass, [place], [place] [id number] PCP: Kathyrn Lass, MD  Subjective: Chief Complaint  Patient presents with  . Left Shoulder - Pain    HPI: Traylon is a patient with known left shoulder arthritis.  Reports continued pain.  Getting little bit worse but better over the past few days.  Hard time laying on that left side.  Anti-inflammatories increase his blood pressure.              ROS: All systems reviewed are negative as they relate to the chief complaint within the history of present illness.  Patient denies  fevers or chills.   Assessment & Plan: Visit Diagnoses:  1. Arthritis of left shoulder region     Plan: Impression is left shoulder arthritis symptomatic with 2 months since last injection.  We need to wait another couple months to inject that shoulder.  I will try him on some tramadol to take alternating with anti-inflammatories.  He is going to need to get that shoulder replaced at some point but his passive range of motion is actually fairly well-maintained at this time  Follow-Up Instructions: No follow-ups on file.   Orders:  No orders of the defined types were placed in this encounter.  No orders of the defined types were placed in this encounter.     Procedures: No procedures performed   Clinical Data: No additional findings.  Objective: Vital Signs: There were no vitals taken for this visit.  Physical Exam:   Constitutional: Patient appears well-developed HEENT:  Head: Normocephalic Eyes:EOM are normal Neck: Normal range of motion Cardiovascular: Normal rate Pulmonary/chest: Effort normal Neurologic: Patient is alert Skin: Skin is warm Psychiatric: Patient has normal mood and affect    Ortho Exam: Ortho exam demonstrates good rotator cuff strength isolated  infraspinatus supraspinatus and subscap muscle testing.  No masses lymphadenopathy or skin changes noted in that shoulder girdle region.  Does have forward flexion and abduction both above 90 degrees.  External rotation of 15 degrees of abduction is about 30 degrees.  Specialty Comments:  No specialty comments available.  Imaging: No results found.   PMFS History: Patient Active Problem List   Diagnosis Date Noted  . Degenerative arthritis of shoulder region 05/21/2016  . Malignant neoplasm of prostate ([place]) 11/20/2014  . Travel advice encounter 04/01/2014   Past Medical History:  Diagnosis Date  . Arthritis    shoulders, knees, hips  . At risk for sleep apnea    STOP-BANG= 5     SENT TO PCP 04-12-2015  . GERD (gastroesophageal reflux disease)   . History of colon polyps   . History of radiation therapy 45 Gy plus seed implants on 04-13-2015   prostate external beam 02-20-2015 to 03-24-2015  . Hyperlipidemia   . Hypertension   . Lower urinary tract symptoms (LUTS)   . Prostate cancer [facility]) urologist-  dr ottelin/  oncologist- dr Tammi Klippel   T1c, Gleason 4+3,  PSA 6.83,  vol. 32.56cc--  s/p  external beam radiation 02-20-2015 to 03-24-2015  . Type 2 diabetes mellitus ([place])   . Type 2 diabetes, diet controlled ([place])   . Wears glasses     Family History  Problem Relation Age of Onset  . Stroke Mother   .  Stroke Father   . Cancer Neg Hx     Past Surgical History:  Procedure Laterality Date  . COLONOSCOPY W/ POLYPECTOMY  2013  . ORIF LEFT LEG FX  1988  . ORIF RIGHT ANKLE FX  1985  . PROSTATE BIOPSY    . RADIOACTIVE SEED IMPLANT N/A 04/14/2015   Procedure: RADIOACTIVE SEED IMPLANT;  Surgeon: Kathie Rhodes, MD;  Location: [facility];  Service: Urology;  Laterality: N/A;   64     seeds implanted no seeds found in bladder  . TOTAL SHOULDER ARTHROPLASTY Right 05/21/2016   Procedure: TOTAL SHOULDER ARTHROPLASTY;  Surgeon: Meredith Pel, MD;  Location: [place];   Service: Orthopedics;  Laterality: Right;   Social History   Occupational History  . Not on file  Tobacco Use  . Smoking status: Former Smoker    Packs/day: 0.50    Years: 15.00    Pack years: 7.50    Types: Cigarettes    Last attempt to quit: 04/12/1999    Years since quitting: 19.9  . Smokeless tobacco: Never Used  Substance and Sexual Activity  . Alcohol use: Yes    Comment: occasional  . Drug use: No  . Sexual activity: Not on file

## 2019-03-15 NOTE — Addendum Note (Signed)
Addended byLaurann Montana on: 03/15/2019 01:38 PM   Modules accepted: Orders

## 2019-03-29 ENCOUNTER — Other Ambulatory Visit (HOSPITAL_COMMUNITY)
Admission: RE | Admit: 2019-03-29 | Discharge: 2019-03-29 | Disposition: A | Discharge status: Home / Self Care | Payer: BC Managed Care – PPO | Source: Ambulatory Visit | Attending: Family | Admitting: Family

## 2019-03-29 ENCOUNTER — Other Ambulatory Visit: Payer: Self-pay | Admitting: Behavioral Health

## 2019-03-29 ENCOUNTER — Ambulatory Visit: Payer: BC Managed Care – PPO

## 2019-03-29 ENCOUNTER — Other Ambulatory Visit: Payer: Self-pay

## 2019-03-29 ENCOUNTER — Other Ambulatory Visit: Payer: BC Managed Care – PPO

## 2019-03-29 DIAGNOSIS — B20 Human immunodeficiency virus [HIV] disease: Secondary | ICD-10-CM

## 2019-03-29 DIAGNOSIS — Z113 Encounter for screening for infections with a predominantly sexual mode of transmission: Secondary | ICD-10-CM

## 2019-03-29 DIAGNOSIS — Z79899 Other long term (current) drug therapy: Secondary | ICD-10-CM

## 2019-03-29 LAB — URINALYSIS
Bilirubin Urine: NEGATIVE
Glucose, UA: NEGATIVE
Ketones, ur: NEGATIVE
Leukocytes,Ua: NEGATIVE
Nitrite: NEGATIVE
Protein, ur: NEGATIVE
Specific Gravity, Urine: 1.013 (ref 1.001–1.03)
pH: 7 (ref 5.0–8.0)

## 2019-03-30 LAB — URINE CYTOLOGY ANCILLARY ONLY
Chlamydia: NEGATIVE
Neisseria Gonorrhea: NEGATIVE

## 2019-03-30 LAB — T-HELPER CELL (CD4) - (RCID CLINIC ONLY)
CD4 % Helper T Cell: 20 % — ABNORMAL LOW (ref 33–55)
CD4 T Cell Abs: 690 /uL (ref 400–2700)

## 2019-04-07 LAB — CBC WITH DIFFERENTIAL/PLATELET
Absolute Monocytes: 582 cells/uL (ref 200–950)
Basophils Absolute: 100 cells/uL (ref 0–200)
Basophils Relative: 1.1 %
Eosinophils Absolute: 82 cells/uL (ref 15–500)
Eosinophils Relative: 0.9 %
HCT: 43 % (ref 38.5–50.0)
Hemoglobin: 15.2 g/dL (ref 13.2–17.1)
Lymphs Abs: 3531 cells/uL (ref 850–3900)
MCH: 31.9 pg (ref 27.0–33.0)
MCHC: 35.3 g/dL (ref 32.0–36.0)
MCV: 90.1 fL (ref 80.0–100.0)
MPV: 9.9 fL (ref 7.5–12.5)
Monocytes Relative: 6.4 %
Neutro Abs: 4805 cells/uL (ref 1500–7800)
Neutrophils Relative %: 52.8 %
Platelets: 340 10*3/uL (ref 140–400)
RBC: 4.77 10*6/uL (ref 4.20–5.80)
RDW: 12.2 % (ref 11.0–15.0)
Total Lymphocyte: 38.8 %
WBC: 9.1 10*3/uL (ref 3.8–10.8)

## 2019-04-07 LAB — HIV-1 GENOTYPE: HIV-1 Genotype: DETECTED — AB

## 2019-04-07 LAB — COMPLETE METABOLIC PANEL WITH GFR
AG Ratio: 1.5 (calc) (ref 1.0–2.5)
ALT: 45 U/L (ref 9–46)
AST: 22 U/L (ref 10–35)
Albumin: 4.2 g/dL (ref 3.6–5.1)
Alkaline phosphatase (APISO): 60 U/L (ref 35–144)
BUN/Creatinine Ratio: 18 (calc) (ref 6–22)
BUN: 12 mg/dL (ref 7–25)
CO2: 29 mmol/L (ref 20–32)
Calcium: 9.6 mg/dL (ref 8.6–10.3)
Chloride: 97 mmol/L — ABNORMAL LOW (ref 98–110)
Creat: 0.67 mg/dL — ABNORMAL LOW (ref 0.70–1.25)
GFR, Est African American: 119 mL/min/{1.73_m2} (ref >=60)
GFR, Est Non African American: 103 mL/min/{1.73_m2} (ref >=60)
Globulin: 2.8 g/dL (calc) (ref 1.9–3.7)
Glucose, Bld: 157 mg/dL — ABNORMAL HIGH (ref 65–99)
Potassium: 4.2 mmol/L (ref 3.5–5.3)
Sodium: 134 mmol/L — ABNORMAL LOW (ref 135–146)
Total Bilirubin: 0.5 mg/dL (ref 0.2–1.2)
Total Protein: 7 g/dL (ref 6.1–8.1)

## 2019-04-07 LAB — QUANTIFERON-TB GOLD PLUS
Mitogen-NIL: 6.93 IU/mL
NIL: 0.2 IU/mL
QuantiFERON-TB Gold Plus: NEGATIVE
TB1-NIL: <0 IU/mL
TB2-NIL: <0 IU/mL

## 2019-04-07 LAB — RPR: RPR Ser Ql: NONREACTIVE

## 2019-04-07 LAB — LIPID PANEL
Cholesterol: 146 mg/dL (ref <200)
HDL: 36 mg/dL — ABNORMAL LOW (ref >=40)
LDL Cholesterol (Calc): 70 mg/dL (calc)
Non-HDL Cholesterol (Calc): 110 mg/dL (calc) (ref <130)
Total CHOL/HDL Ratio: 4.1 (calc) (ref <5.0)
Triglycerides: 334 mg/dL — ABNORMAL HIGH (ref <150)

## 2019-04-07 LAB — HEPATITIS B SURFACE ANTIGEN: Hepatitis B Surface Ag: NONREACTIVE

## 2019-04-07 LAB — HEPATITIS C ANTIBODY
Hepatitis C Ab: NONREACTIVE
SIGNAL TO CUT-OFF: 0.03 (ref <1.00)

## 2019-04-07 LAB — HIV-1 RNA ULTRAQUANT REFLEX TO GENTYP+
HIV 1 RNA Quant: 1240000 copies/mL — ABNORMAL HIGH
HIV-1 RNA Quant, Log: 6.09 Log copies/mL — ABNORMAL HIGH

## 2019-04-07 LAB — HEPATITIS B CORE ANTIBODY, TOTAL: Hep B Core Total Ab: NONREACTIVE

## 2019-04-07 LAB — HIV ANTIBODY (ROUTINE TESTING W REFLEX): HIV 1&2 Ab, 4th Generation: REACTIVE — AB

## 2019-04-07 LAB — HLA B*5701: HLA-B*5701 w/rflx HLA-B High: NEGATIVE

## 2019-04-07 LAB — HIV-1/2 AB - DIFFERENTIATION
HIV-1 antibody: POSITIVE — AB
HIV-2 Ab: NEGATIVE

## 2019-04-07 LAB — HEPATITIS B SURFACE ANTIBODY,QUALITATIVE: Hep B S Ab: NONREACTIVE

## 2019-04-07 LAB — HEPATITIS A ANTIBODY, TOTAL: Hepatitis A AB,Total: NONREACTIVE

## 2019-04-29 ENCOUNTER — Telehealth: Payer: Self-pay | Admitting: Family

## 2019-04-29 NOTE — Telephone Encounter (Signed)
COVID-19 Pre-Screening Questions: ° °Do you currently have a fever (>100 °F), chills or unexplained body aches? No   ° °Are you currently experiencing new cough, shortness of breath, sore throat, runny nose? No   °•  °Have you recently travelled outside the state of [place] in the last 14 days? no °•  °1. Have you been in contact with someone that is currently pending confirmation of Covid19 testing or has been confirmed to have the Covid19 virus?  No  ° °

## 2019-05-03 ENCOUNTER — Other Ambulatory Visit: Payer: Self-pay

## 2019-05-03 ENCOUNTER — Telehealth: Payer: Self-pay | Admitting: Pharmacy Technician

## 2019-05-03 ENCOUNTER — Ambulatory Visit: Payer: BC Managed Care – PPO | Admitting: Family

## 2019-05-03 ENCOUNTER — Encounter: Payer: Self-pay | Admitting: Family

## 2019-05-03 VITALS — BP 143/83 | HR 83 | Temp 98.4°F | Wt 204.0 lb

## 2019-05-03 DIAGNOSIS — E1165 Type 2 diabetes mellitus with hyperglycemia: Secondary | ICD-10-CM | POA: Diagnosis not present

## 2019-05-03 DIAGNOSIS — E6609 Other obesity due to excess calories: Secondary | ICD-10-CM | POA: Diagnosis not present

## 2019-05-03 DIAGNOSIS — I10 Essential (primary) hypertension: Secondary | ICD-10-CM

## 2019-05-03 DIAGNOSIS — Z6832 Body mass index (BMI) 32.0-32.9, adult: Secondary | ICD-10-CM

## 2019-05-03 DIAGNOSIS — B20 Human immunodeficiency virus [HIV] disease: Secondary | ICD-10-CM

## 2019-05-03 MED ORDER — BICTEGRAVIR-EMTRICITAB-TENOFOV 50-200-25 MG PO TABS: 1.0000 | ORAL_TABLET | Freq: Every day | ORAL | 2 refills | Status: DC

## 2019-05-03 MED FILL — BIKTARVY 50-200-25 MG TABS: 50-200-25 | 30 days supply | Qty: 30 | Fill #0

## 2019-05-03 NOTE — Assessment & Plan Note (Signed)
Blood pressure adequate today and likely increased due to anxiety. Appears with good adherence and tolerance to linsinopril-hctz. No headaches or changes in vision. Encouraged to monitor blood pressure at home with management per primary care.

## 2019-05-03 NOTE — Patient Instructions (Signed)
Nice to meet you.  We will get you started on Biktarvy.  Prescription has been sent to [facility].  Rollene Fare, our counselor if available as needed.  Plan for follow up in 1 month or sooner if needed with lab work that day.  Please let us know if you have any questions.  Have a great day and stay safe!

## 2019-05-03 NOTE — Assessment & Plan Note (Signed)
William Mcfarland is newly diagnosed with likely acute HIV infection given recent blood work showing viral load of 1.2 million and initial blood work without differentiation of HIV-1/HIV 2 and now positive for HIV-1. We discussed the pathogenesis, transmission, prevention, risk of progression if left untreated and treatments for HIV. Adequate time provided for questions which were answered. He has Pharmacist, community and copay card was provided for medication. Will plan to start him on Biktarvy. Plan for follow up in 1 month or sooner if needed with lab work at that appointment.

## 2019-05-03 NOTE — Telephone Encounter (Signed)
[facility] Patient Advocate Encounter   Was successful in obtaining a [place] copay card for [employer].  This copay card will make the patients copay 0.  I have spoken with the patient.    The billing information is as follows and has been shared with Graham.  RxBin: [id number] PCN: ACCESS Member ID: [id number] Group ID: [id number]  [place] Nadara Mustard [place] Patient [facility] Phone: [phone] Fax:  [phone]

## 2019-05-03 NOTE — Assessment & Plan Note (Signed)
Appears stable with most recent A1c of 6.5 with good adherence and tolerance to metformin. Discussed importance of controlling diabetes as HIV when combined with diabetes exponentially increases risk for cardiovascular and renal disease in the future. Continue diabetes management per primary care.

## 2019-05-03 NOTE — Assessment & Plan Note (Signed)
Mr. Swoveland has a BMI of 32. Encouraged weight loss of 5-10% of current body weight through nutrition and physical activity. This will also to help manage his diabetes and hypertension.

## 2019-05-03 NOTE — Progress Notes (Signed)
Subjective:    Patient ID: RACHEL SAMPLES, male    DOB: 03-Aug-1956, 63 y.o.   MRN: [id number]  Chief Complaint  Patient presents with   HIV Positive/AIDS    HPI:  HORACE LUKAS is a 63 y.o. male with previous medical history of prostate cancer, Type 2 diabetes, hypertension, Syphilis, and coronary artery disease who was evaluated in his primary care office for a rash and through lab work was positive for HIV with negative HIV-1/HIV-2 antibody and positive viral load on 03/20/19. He has been referred to establish care for newly diagnosed HIV disease.  Mr. Lusty completed his initial clinical blood work on on 03/29/2019 with initial viral load of 1.24 million and CD4 count of 690. HIV-1 antibody is now positive confirming diagnosis of HIV.  Genotype detected showing subtype B with no significant mutations or resistance and wild type virus.  Renal function, liver function, and electrolytes within normal ranges.  Cholesterol showing HDL of 36 with LDL of 70 and triglycerides of 334.  Negative for syphilis, gonorrhea, and chlamydia. HLA-[id number] and QuantiFERON gold were negative.  No active infection with hepatitis B or hepatitis C.  He is not immune serologically to hepatitis A or B.  Mr. Swor is unsure of his risk factor for acquiring HIV at present. Denies any recent heterosexual contact or events where he would have come in contact with blood/bodily fluids. He does work as a Air traffic controller at [employer]. He initially had a rash which has improved since initial onset. He is anxious about his new diagnosis and the potential financial impact. No recreational or illicit drug use at present. Does not smoke tobacco or consume alcohol at present.    No Known Allergies    Outpatient Medications Prior to Visit  Medication Sig Dispense Refill   atorvastatin (LIPITOR) 10 MG tablet Take 10 mg by mouth daily.     clindamycin (CLEOCIN) 300 MG capsule Take 2 capsules 1 hour prior to  dental procedures. 12 capsule 1   escitalopram (LEXAPRO) 10 MG tablet Take 10 mg by mouth daily.     lisinopril-hydrochlorothiazide (ZESTORETIC) 20-12.5 MG tablet Take 1 tablet by mouth 2 (two) times a day.     metFORMIN (GLUCOPHAGE) 500 MG tablet Take 500 mg by mouth 2 (two) times daily with a meal.     lisinopril (PRINIVIL,ZESTRIL) 40 MG tablet Take 40 mg by mouth every morning.     methocarbamol (ROBAXIN) 500 MG tablet Take 1 tablet (500 mg total) by mouth every 6 (six) hours as needed for muscle spasms. 30 tablet 1   naproxen sodium (ANAPROX) 220 MG tablet Take 220 mg by mouth 2 (two) times daily as needed. For pain     oxyCODONE (OXY IR/ROXICODONE) 5 MG immediate release tablet Take 1-2 tablets (5-10 mg total) by mouth every 3 (three) hours as needed for breakthrough pain. (Patient not taking: Reported on 05/03/2019) 60 tablet 0   tetrahydrozoline 0.05 % ophthalmic solution Place 1 drop into both eyes 2 (two) times daily as needed.     traMADol (ULTRAM) 50 MG tablet 1 po tid prn pain (Patient not taking: Reported on 05/03/2019) 30 tablet 0   No facility-administered medications prior to visit.      Past Medical History:  Diagnosis Date   Arthritis    shoulders, knees, hips   At risk for sleep apnea    STOP-BANG= 5     SENT TO PCP 04-12-2015   GERD (gastroesophageal reflux disease)  History of colon polyps    History of radiation therapy 45 Gy plus seed implants on 04-13-2015   prostate external beam 02-20-2015 to 03-24-2015   Hyperlipidemia    Hypertension    Lower urinary tract symptoms (LUTS)    Prostate cancer [facility]) urologist-  dr ottelin/  oncologist- dr Tammi Klippel   T1c, Gleason 4+3,  PSA 6.83,  vol. 32.56cc--  s/p  external beam radiation 02-20-2015 to 03-24-2015   Type 2 diabetes mellitus ([place])    Type 2 diabetes, diet controlled ([place])    Wears glasses       Past Surgical History:  Procedure Laterality Date   COLONOSCOPY W/ POLYPECTOMY  2013    ORIF LEFT LEG FX  1988   ORIF RIGHT ANKLE FX  1985   PROSTATE BIOPSY     RADIOACTIVE SEED IMPLANT N/A 04/14/2015   Procedure: RADIOACTIVE SEED IMPLANT;  Surgeon: Kathie Rhodes, MD;  Location: [place];  Service: Urology;  Laterality: N/A;   64     seeds implanted no seeds found in bladder   TOTAL SHOULDER ARTHROPLASTY Right 05/21/2016   Procedure: TOTAL SHOULDER ARTHROPLASTY;  Surgeon: Meredith Pel, MD;  Location: [place];  Service: Orthopedics;  Laterality: Right;      Family History  Problem Relation Age of Onset   Stroke Father    Stroke Mother    Cancer Neg Hx       Social History   Socioeconomic History   Marital status: Divorced    Spouse name: Not on file   Number of children: 0   Years of education: 12   Highest education level: Not on file  Occupational History   Occupation: Geneticist, molecular strain: Not on file   Food insecurity:    Worry: Not on file    Inability: Not on file   Transportation needs:    Medical: Not on file    Non-medical: Not on file  Tobacco Use   Smoking status: Former Smoker    Packs/day: 0.50    Years: 15.00    Pack years: 7.50    Types: Cigarettes    Last attempt to quit: 04/12/1999    Years since quitting: 20.0   Smokeless tobacco: Never Used  Substance and Sexual Activity   Alcohol use: Yes    Comment: occasional   Drug use: No   Sexual activity: Not on file  Lifestyle   Physical activity:    Days per week: Not on file    Minutes per session: Not on file   Stress: Not on file  Relationships   Social connections:    Talks on phone: Not on file    Gets together: Not on file    Attends religious service: Not on file    Active member of club or organization: Not on file    Attends meetings of clubs or organizations: Not on file    Relationship status: Not on file   Intimate partner violence:    Fear of current or ex partner: Not on file     Emotionally abused: Not on file    Physically abused: Not on file    Forced sexual activity: Not on file  Other Topics Concern   Not on file  Social History Narrative   Not on file      Review of Systems  Constitutional: Negative for appetite change, chills, fatigue, fever and unexpected weight change.  Eyes: Negative for visual disturbance.  Respiratory: Negative for cough, chest tightness, shortness of breath and wheezing.   Cardiovascular: Negative for chest pain and leg swelling.  Gastrointestinal: Negative for abdominal pain, constipation, diarrhea, nausea and vomiting.  Genitourinary: Negative for dysuria, flank pain, frequency, genital sores, hematuria and urgency.  Skin: Negative for rash.  Allergic/Immunologic: Negative for immunocompromised state.  Neurological: Negative for dizziness and headaches.       Objective:    BP (!) 143/83    Pulse 83    Temp 98.4 F (36.9 C) (Oral)    Wt 204 lb (92.5 kg)    BMI 32.93 kg/m  Nursing note and vital signs reviewed.  Physical Exam Constitutional:      General: He is not in acute distress.    Appearance: He is well-developed.  Eyes:     Conjunctiva/sclera: Conjunctivae normal.  Neck:     Musculoskeletal: Neck supple.  Cardiovascular:     Rate and Rhythm: Normal rate and regular rhythm.     Heart sounds: Normal heart sounds. No murmur. No friction rub. No gallop.   Pulmonary:     Effort: Pulmonary effort is normal. No respiratory distress.     Breath sounds: Normal breath sounds. No wheezing or rales.  Chest:     Chest wall: No tenderness.  Abdominal:     General: Bowel sounds are normal.     Palpations: Abdomen is soft.     Tenderness: There is no abdominal tenderness.  Lymphadenopathy:     Cervical: No cervical adenopathy.  Skin:    General: Skin is warm and dry.     Findings: No rash.  Neurological:     Mental Status: He is alert and oriented to person, place, and time.  Psychiatric:        Mood and  Affect: Mood is anxious.        Behavior: Behavior normal.        Thought Content: Thought content normal.        Judgment: Judgment normal.         Assessment & Plan:   Problem List Items Addressed This Visit      Cardiovascular and Mediastinum   Essential hypertension    Blood pressure adequate today and likely increased due to anxiety. Appears with good adherence and tolerance to linsinopril-hctz. No headaches or changes in vision. Encouraged to monitor blood pressure at home with management per primary care.       Relevant Medications   lisinopril-hydrochlorothiazide (ZESTORETIC) 20-12.5 MG tablet   atorvastatin (LIPITOR) 10 MG tablet     Endocrine   Type 2 diabetes mellitus with hyperglycemia, without long-term current use of insulin (HCC)    Appears stable with most recent A1c of 6.5 with good adherence and tolerance to metformin. Discussed importance of controlling diabetes as HIV when combined with diabetes exponentially increases risk for cardiovascular and renal disease in the future. Continue diabetes management per primary care.       Relevant Medications   lisinopril-hydrochlorothiazide (ZESTORETIC) 20-12.5 MG tablet   atorvastatin (LIPITOR) 10 MG tablet   metFORMIN (GLUCOPHAGE) 500 MG tablet     Other   Human immunodeficiency virus (HIV) disease ([place]) - Primary    Mr. Grissett is newly diagnosed with likely acute HIV infection given recent blood work showing viral load of 1.2 million and initial blood work without differentiation of HIV-1/HIV 2 and now positive for HIV-1. We discussed the pathogenesis, transmission, prevention, risk of progression if left untreated and treatments for HIV. Adequate time  provided for questions which were answered. He has Pharmacist, community and copay card was provided for medication. Will plan to start him on Biktarvy. Plan for follow up in 1 month or sooner if needed with lab work at that appointment.       Relevant Medications    bictegravir-emtricitabine-tenofovir AF (BIKTARVY) 50-200-25 MG TABS tablet   Class 1 obesity due to excess calories with serious comorbidity and body mass index (BMI) of 32.0 to 32.9 in adult    Mr. Emmanuel has a BMI of 32. Encouraged weight loss of 5-10% of current body weight through nutrition and physical activity. This will also to help manage his diabetes and hypertension.       Relevant Medications   metFORMIN (GLUCOPHAGE) 500 MG tablet       I have discontinued Sharon [place]. Cappella's lisinopril, tetrahydrozoline, naproxen sodium, methocarbamol, oxyCODONE, and traMADol. I am also having him start on bictegravir-emtricitabine-tenofovir AF. Additionally, I am having him maintain his clindamycin, escitalopram, lisinopril-hydrochlorothiazide, atorvastatin, and metFORMIN.   Meds ordered this encounter  Medications   bictegravir-emtricitabine-tenofovir AF (BIKTARVY) 50-200-25 MG TABS tablet    Sig: Take 1 tablet by mouth daily.    Dispense:  30 tablet    Refill:  2    Will pick up today    Order Specific Question:   Supervising Provider    Answer:   Carlyle Basques [[id number]]     Follow-up: Return in about 1 month (around 06/03/2019), or if symptoms worsen or fail to improve.    Terri Piedra, MSN, FNP-C Nurse Practitioner [facility] for Infectious [facility] Office phone: [phone] Pager: [place] number: [phone]

## 2019-05-03 NOTE — Telephone Encounter (Signed)
[facility] Patient Teacher, English as a foreign language completed.    The patient is insured through [employer] and has a $30 copay.  We will continue to follow to see if copay assistance is needed.  Venida Jarvis. Nadara Mustard [place] Patient [facility] for Infectious Disease Phone: [phone] Fax:  [phone]

## 2019-05-19 ENCOUNTER — Other Ambulatory Visit: Payer: Self-pay | Admitting: *Deleted

## 2019-05-19 DIAGNOSIS — B20 Human immunodeficiency virus [HIV] disease: Secondary | ICD-10-CM

## 2019-05-19 MED ORDER — BICTEGRAVIR-EMTRICITAB-TENOFOV 50-200-25 MG PO TABS: 1.0000 | ORAL_TABLET | Freq: Every day | ORAL | 5 refills | Status: DC

## 2019-05-19 NOTE — Telephone Encounter (Signed)

## 2019-05-20 ENCOUNTER — Encounter: Payer: Self-pay | Admitting: Family

## 2019-05-24 ENCOUNTER — Telehealth (INDEPENDENT_AMBULATORY_CARE_PROVIDER_SITE_OTHER): Payer: BC Managed Care – PPO | Admitting: Cardiology

## 2019-05-24 ENCOUNTER — Other Ambulatory Visit: Payer: Self-pay

## 2019-05-24 ENCOUNTER — Encounter: Payer: Self-pay | Admitting: Cardiology

## 2019-05-24 VITALS — BP 136/81 | HR 80 | Ht 66.0 in | Wt 202.0 lb

## 2019-05-24 DIAGNOSIS — R0609 Other forms of dyspnea: Secondary | ICD-10-CM

## 2019-05-24 DIAGNOSIS — I10 Essential (primary) hypertension: Secondary | ICD-10-CM

## 2019-05-24 DIAGNOSIS — E785 Hyperlipidemia, unspecified: Secondary | ICD-10-CM

## 2019-05-24 DIAGNOSIS — E1169 Type 2 diabetes mellitus with other specified complication: Secondary | ICD-10-CM

## 2019-05-24 NOTE — Patient Instructions (Signed)
Medication Instructions:  Your physician recommends that you continue on your current medications as directed. Please refer to the Current Medication list given to you today.  If you need a refill on your cardiac medications before your next appointment, please call your pharmacy.   Lab work: None ordered If you have labs (blood work) drawn today and your tests are completely normal, you will receive your results only by: Marland Kitchen MyChart Message (if you have MyChart) OR . A paper copy in the mail If you have any lab test that is abnormal or we need to change your treatment, we will call you to review the results.  Testing/Procedures: None ordered  Follow-Up:Your physician wants you to follow-up in: one year with Dr. Marlou Porch. You will receive a reminder letter in the mail two months in advance. If you don't receive a letter, please call our office to schedule the follow-up appointment.   Any Other Special Instructions Will Be Listed Below (If Applicable).

## 2019-05-24 NOTE — Progress Notes (Signed)
Virtual Visit via Telephone Note   This visit type was conducted due to national recommendations for restrictions regarding the COVID-19 Pandemic (e.g. social distancing) in an effort to limit this patient's exposure and mitigate transmission in our community.  Due to his co-morbid illnesses, this patient is at least at moderate risk for complications without adequate follow up.  This format is felt to be most appropriate for this patient at this time.  The patient did not have access to video technology/had technical difficulties with video requiring transitioning to audio format only (telephone).  All issues noted in this document were discussed and addressed.  No physical exam could be performed with this format.  Please refer to the patient's chart for his  consent to telehealth for [facility].   Date:  05/24/2019   ID:  William Mcfarland, DOB 02/20/1956, MRN [id number]  Patient Location: Home Provider Location: Home  PCP:  Kathyrn Lass, MD  Cardiologist:  Candee Furbish, MD  Electrophysiologist:  None   Evaluation Performed:  Consultation - William Mcfarland was referred by Dr. Kathyrn Lass for the evaluation of shortness of breath.  Chief Complaint:    History of Present Illness:    William Mcfarland is a 63 y.o. male with HIV fairly new Dx.  diabetes, hypertension, previously described nonobstructive coronary artery disease (40%) by catheterization in May 2004 previously seen several years ago here to discuss the possibility of stress test.  On 05/03/2019 a note from Dr. Kathyrn Lass reviewed and he was describing shortness of breath when he gets to the top of the stairs, felt hot when he gets there.  Legs felt weak.  Legs felt like he ran a marathon.  No obvious chest pain just more shortness of breath than usual. Felt this on Mother's day. Tight spiral stair case 48 steps. Had to stop twice, light headed. One week it lasted. Now feels better.  Almost feels like his symptoms  have resolved.  Yesterday made it fine without issues.  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  Quit smoking in 1999.  No early family history of CAD.  Has had depression as well.  Has struggled with weight, enjoys occasional beer he states.  At his lowest, was down to 176 pounds.  The patient does not have symptoms concerning for COVID-19 infection (fever, chills, cough, or new shortness of breath).    Past Medical History:  Diagnosis Date  . Arthritis    shoulders, knees, hips  . At risk for sleep apnea    STOP-BANG= 5     SENT TO PCP 04-12-2015  . GERD (gastroesophageal reflux disease)   . History of colon polyps   . History of radiation therapy 45 Gy plus seed implants on 04-13-2015   prostate external beam 02-20-2015 to 03-24-2015  . Hyperlipidemia   . Hypertension   . Lower urinary tract symptoms (LUTS)   . Prostate cancer [facility]) urologist-  dr ottelin/  oncologist- dr Tammi Klippel   T1c, Gleason 4+3,  PSA 6.83,  vol. 32.56cc--  s/p  external beam radiation 02-20-2015 to 03-24-2015  . Type 2 diabetes mellitus ([place])   . Type 2 diabetes, diet controlled ([place])   . Wears glasses    Past Surgical History:  Procedure Laterality Date  . COLONOSCOPY W/ POLYPECTOMY  2013  . ORIF LEFT LEG FX  1988  . ORIF RIGHT ANKLE FX  1985  . PROSTATE BIOPSY    . RADIOACTIVE SEED IMPLANT N/A  04/14/2015   Procedure: RADIOACTIVE SEED IMPLANT;  Surgeon: Kathie Rhodes, MD;  Location: [facility];  Service: Urology;  Laterality: N/A;   64     seeds implanted no seeds found in bladder  . TOTAL SHOULDER ARTHROPLASTY Right 05/21/2016   Procedure: TOTAL SHOULDER ARTHROPLASTY;  Surgeon: Meredith Pel, MD;  Location: [place];  Service: Orthopedics;  Laterality: Right;     Current Meds  Medication Sig  . atorvastatin (LIPITOR) 10 MG tablet Take 10 mg by mouth daily.  . bictegravir-emtricitabine-tenofovir AF (BIKTARVY) 50-200-25 MG TABS tablet Take 1 tablet by  mouth daily.  . clindamycin (CLEOCIN) 300 MG capsule Take 2 capsules 1 hour prior to dental procedures.  Marland Kitchen escitalopram (LEXAPRO) 10 MG tablet Take 10 mg by mouth daily.  Marland Kitchen lisinopril-hydrochlorothiazide (ZESTORETIC) 20-12.5 MG tablet Take 1 tablet by mouth 2 (two) times a day.  . metFORMIN (GLUCOPHAGE) 500 MG tablet Take 500 mg by mouth 2 (two) times daily with a meal.     Allergies:   Patient has no known allergies.   Social History   Tobacco Use  . Smoking status: Former Smoker    Packs/day: 0.50    Years: 15.00    Pack years: 7.50    Types: Cigarettes    Last attempt to quit: 04/12/1999    Years since quitting: 20.1  . Smokeless tobacco: Never Used  Substance Use Topics  . Alcohol use: Yes    Comment: occasional  . Drug use: No     Family Hx: The patient's family history includes Stroke in his father and mother. There is no history of Cancer.  ROS:   Please see the history of present illness.    Positive for shortness of breath earlier.  Feeling better now.  No fever chills nausea vomiting syncope bleeding All other systems reviewed and are negative.   Prior CV studies:   The following studies were reviewed today:  Prior catheterization 2004-40% lesion nonobstructive.  Labs/Other Tests and Data Reviewed:    EKG:  An ECG dated 05/10/16 was personally reviewed today and demonstrated:  Sinus rhythm no other abnormalities  Recent Labs: 03/29/2019: ALT 45; BUN 12; Creat 0.67; Hemoglobin 15.2; Platelets 340; Potassium 4.2; Sodium 134   Recent Lipid Panel Lab Results  Component Value Date/Time   CHOL 146 03/29/2019 08:58 AM   TRIG 334 (H) 03/29/2019 08:58 AM   HDL 36 (L) 03/29/2019 08:58 AM   CHOLHDL 4.1 03/29/2019 08:58 AM   LDLCALC 70 03/29/2019 08:58 AM    Wt Readings from Last 3 Encounters:  05/24/19 202 lb (91.6 kg)  05/03/19 204 lb (92.5 kg)  05/21/16 203 lb (92.1 kg)     Objective:    Vital Signs:  BP 136/81   Pulse 80   Ht 5\' 6"  (1.676 m)   Wt 202  lb (91.6 kg)   BMI 32.60 kg/m    VITAL SIGNS:  reviewed pleasant, alert, able to complete full sentences without difficulty  ASSESSMENT & PLAN:    Shortness of breath with previously described nonobstructive coronary artery disease on catheterization 2004 - Currently he describes that his shortness of breath has improved.  He has been able to get up the stairway, spiral staircase at work now without having to stop.  He was nervous about the way he felt previously but now is feeling better, more confident.  I offered him further testing however at this time, we mutually agreed to forego.  Obviously, if his symptoms return or  become more worrisome, he will let me know and we will have low threshold for stress testing.  Regardless, we will touch base in 1 year. -Continue with aggressive secondary risk factor modification. - On statin, excellent hypertension control. -Consider low-dose aspirin.  Essential hypertension - Medications reviewed.  Doing well.  When he lost weight previously to 176 pounds, he was able to pull back some on his medication dosage.  Continue to-decrease carbohydrates.  He states that he does enjoy beer occasionally.     COVID-19 Education: The signs and symptoms of COVID-19 were discussed with the patient and how to seek care for testing (follow up with PCP or arrange E-visit).  The importance of social distancing was discussed today.  Time:   Today, I have spent 20 minutes with the patient with telehealth technology discussing the above problems.     Medication Adjustments/Labs and Tests Ordered: Current medicines are reviewed at length with the patient today.  Concerns regarding medicines are outlined above.   Tests Ordered: No orders of the defined types were placed in this encounter.   Medication Changes: No orders of the defined types were placed in this encounter.   Disposition:  Follow up in 1 year(s)  Signed, Candee Furbish, MD  05/24/2019 11:10 AM     [facility]

## 2019-05-31 NOTE — Telephone Encounter (Signed)
William Mcfarland insurance allowed a 1 time transitional fill through [employer]  He has had that first fill and they now rrequire that he get his medication filled through [facility].  He has the phone number and copay card information to call into them to get his medication shipped to his home at no charge.  [name] Patient [facility] for Infectious Disease Phone: [phone] Fax:  [phone]

## 2019-06-02 ENCOUNTER — Telehealth: Payer: Self-pay | Admitting: Family

## 2019-06-02 ENCOUNTER — Telehealth: Payer: Self-pay | Admitting: Orthopedic Surgery

## 2019-06-02 ENCOUNTER — Other Ambulatory Visit: Payer: Self-pay | Admitting: Orthopedic Surgery

## 2019-06-02 MED ORDER — TRAMADOL HCL 50 MG PO TABS: 50.0000 mg | ORAL_TABLET | Freq: Two times a day (BID) | ORAL | 0 refills | Status: DC | PRN

## 2019-06-02 NOTE — Telephone Encounter (Signed)
Pt called in requesting a refill on his medication Tramadol 50MG  and please have that sent to [facility] on [address]. [phone]

## 2019-06-02 NOTE — Telephone Encounter (Signed)
ok 

## 2019-06-02 NOTE — Telephone Encounter (Signed)
COVID-19 Pre-Screening Questions: ° °Do you currently have a fever (>100 °F), chills or unexplained body aches? No  ° °Are you currently experiencing new cough, shortness of breath, sore throat, runny nose? No  °•  °Have you recently travelled outside the state of [place] in the last 14 days? No  °•  °1. Have you been in contact with someone that is currently pending confirmation of Covid19 testing or has been confirmed to have the Covid19 virus?  No  ° °

## 2019-06-02 NOTE — Telephone Encounter (Signed)
Please advise. Thanks.  

## 2019-06-02 NOTE — Telephone Encounter (Signed)
Submitted rx

## 2019-06-03 ENCOUNTER — Ambulatory Visit: Payer: BC Managed Care – PPO | Admitting: Family

## 2019-06-03 ENCOUNTER — Other Ambulatory Visit: Payer: Self-pay

## 2019-06-03 ENCOUNTER — Encounter: Payer: Self-pay | Admitting: Family

## 2019-06-03 VITALS — BP 131/79 | HR 83 | Temp 98.0°F | Wt 202.0 lb

## 2019-06-03 DIAGNOSIS — Z23 Encounter for immunization: Secondary | ICD-10-CM | POA: Diagnosis not present

## 2019-06-03 DIAGNOSIS — B20 Human immunodeficiency virus [HIV] disease: Secondary | ICD-10-CM

## 2019-06-03 DIAGNOSIS — Z0000 Encounter for general adult medical examination without abnormal findings: Secondary | ICD-10-CM

## 2019-06-03 DIAGNOSIS — I10 Essential (primary) hypertension: Secondary | ICD-10-CM

## 2019-06-03 NOTE — Assessment & Plan Note (Signed)
Mr. Pinsky appears to be doing well with his first month of [place] with no adverse side effects.  He has no signs/symptoms of opportunistic infection or progressive HIV disease.  Discussed being undetectable and U=U today.  Check blood work today.  Continue current dose of Biktarvy.  Plan for follow-up in 6 weeks or sooner if needed with lab work 1 to 2 weeks prior to appointment.

## 2019-06-03 NOTE — Assessment & Plan Note (Signed)
   Prevnar updated today.  Encouraged to follow-up for colon cancer screening with colonoscopy.  Dental exam scheduled for August currently postponed secondary to coronavirus pandemic  Discussed importance of safe sexual practice to reduce risk of acquisition/transmission of STI.

## 2019-06-03 NOTE — Assessment & Plan Note (Signed)
Blood pressure without a question controlled today.  He has no signs/symptoms concerning for endorgan damage at present.  Encouraged to continue medications as prescribed.  Continue current dose of lisinopril-hydrochlorothiazide with changes per primary care.

## 2019-06-03 NOTE — Patient Instructions (Signed)
Nice to see you.   Please continue to take your [place] as prescribed daily.  We will check your lab work today.  We will call and mail your results to you.  Plan for follow up in 6 weeks or sooner if needed with lab work 1-2 weeks prior to appointment.  Have a great day and stay safe!

## 2019-06-03 NOTE — Progress Notes (Signed)
Subjective:    Patient ID: William Mcfarland, male    DOB: 02/11/1956, 63 y.o.   MRN: [id number]  Chief Complaint  Patient presents with   HIV Positive/AIDS     HPI:  William Mcfarland is a 63 y.o. male with HIV, diabetes, and hypertension who was last seen in the office on 05/03/2019 with newly diagnosed HIV infection with initial viral load of 1.2 million and CD4 nadir of 690.  Genotype with no significant mutations or resistance.  He was started on Biktarvy.  He is here today for initial follow-up after starting medication.  Healthcare maintenance due includes Pneumovax, Prevnar, dental screening and colon cancer screening.  [name] continues to take his Biktarvy as prescribed with no adverse side effects and one missed dose since his last office visit.  He is feeling well today with no concerns. Denies fevers, chills, night sweats, headaches, changes in vision, neck pain/stiffness, nausea, diarrhea, vomiting, lesions or rashes.  [name] remains covered through [facility] and has experienced initial change in pharmacies resulting in delay in receiving medications however this is been resolved.  He now has no problems obtaining the medication.  Denies feelings of being down, depressed, or hopeless recently.  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  Not currently sexually active.  Denies recreational or illicit drug use, tobacco use, or alcohol consumption.  He has a dental exam scheduled for August.  He is due for colon cancer screening and will follow-up.  No Known Allergies    Outpatient Medications Prior to Visit  Medication Sig Dispense Refill   atorvastatin (LIPITOR) 10 MG tablet Take 10 mg by mouth daily.     bictegravir-emtricitabine-tenofovir AF (BIKTARVY) 50-200-25 MG TABS tablet Take 1 tablet by mouth daily. 30 tablet 5   escitalopram (LEXAPRO) 10 MG tablet Take 10 mg by mouth daily.     lisinopril-hydrochlorothiazide  (ZESTORETIC) 20-12.5 MG tablet Take 1 tablet by mouth 2 (two) times a day.     metFORMIN (GLUCOPHAGE) 500 MG tablet Take 500 mg by mouth 2 (two) times daily with a meal.     traMADol (ULTRAM) 50 MG tablet Take 1 tablet (50 mg total) by mouth every 12 (twelve) hours as needed. 30 tablet 0   clindamycin (CLEOCIN) 300 MG capsule Take 2 capsules 1 hour prior to dental procedures. 12 capsule 1   No facility-administered medications prior to visit.      Past Medical History:  Diagnosis Date   Arthritis    shoulders, knees, hips   At risk for sleep apnea    STOP-BANG= 5     SENT TO PCP 04-12-2015   GERD (gastroesophageal reflux disease)    History of colon polyps    History of radiation therapy 45 Gy plus seed implants on 04-13-2015   prostate external beam 02-20-2015 to 03-24-2015   Hyperlipidemia    Hypertension    Lower urinary tract symptoms (LUTS)    Prostate cancer [facility]) urologist-  dr ottelin/  oncologist- dr Tammi Klippel   T1c, Gleason 4+3,  PSA 6.83,  vol. 32.56cc--  s/p  external beam radiation 02-20-2015 to 03-24-2015   Type 2 diabetes mellitus ([place])    Type 2 diabetes, diet controlled ([place])    Wears glasses      Past Surgical History:  Procedure Laterality Date   COLONOSCOPY W/ POLYPECTOMY  2013   ORIF LEFT LEG FX  1988   ORIF RIGHT ANKLE FX  1985  PROSTATE BIOPSY     RADIOACTIVE SEED IMPLANT N/A 04/14/2015   Procedure: RADIOACTIVE SEED IMPLANT;  Surgeon: Kathie Rhodes, MD;  Location: [facility];  Service: Urology;  Laterality: N/A;   64     seeds implanted no seeds found in bladder   TOTAL SHOULDER ARTHROPLASTY Right 05/21/2016   Procedure: TOTAL SHOULDER ARTHROPLASTY;  Surgeon: Meredith Pel, MD;  Location: [place];  Service: Orthopedics;  Laterality: Right;     Review of Systems  Constitutional: Negative for appetite change, chills, fatigue, fever and unexpected weight change.  Eyes: Negative for visual disturbance.  Respiratory:  Negative for cough, chest tightness, shortness of breath and wheezing.   Cardiovascular: Negative for chest pain and leg swelling.  Gastrointestinal: Negative for abdominal pain, constipation, diarrhea, nausea and vomiting.  Genitourinary: Negative for dysuria, flank pain, frequency, genital sores, hematuria and urgency.  Skin: Negative for rash.  Allergic/Immunologic: Negative for immunocompromised state.  Neurological: Negative for dizziness and headaches.      Objective:    BP 131/79    Pulse 83    Temp 98 F (36.7 C) (Oral)    Wt 202 lb (91.6 kg)    BMI 32.60 kg/m  Nursing note and vital signs reviewed.  Physical Exam Constitutional:      General: He is not in acute distress.    Appearance: He is well-developed.  Eyes:     Conjunctiva/sclera: Conjunctivae normal.  Neck:     Musculoskeletal: Neck supple.  Cardiovascular:     Rate and Rhythm: Normal rate and regular rhythm.     Heart sounds: Normal heart sounds. No murmur. No friction rub. No gallop.   Pulmonary:     Effort: Pulmonary effort is normal. No respiratory distress.     Breath sounds: Normal breath sounds. No wheezing or rales.  Chest:     Chest wall: No tenderness.  Abdominal:     General: Bowel sounds are normal.     Palpations: Abdomen is soft.     Tenderness: There is no abdominal tenderness.  Lymphadenopathy:     Cervical: No cervical adenopathy.  Skin:    General: Skin is warm and dry.     Findings: No rash.  Neurological:     Mental Status: He is alert and oriented to person, place, and time.  Psychiatric:        Behavior: Behavior normal.        Thought Content: Thought content normal.        Judgment: Judgment normal.        Assessment & Plan:   Problem List Items Addressed This Visit      Cardiovascular and Mediastinum   Essential hypertension    Blood pressure without a question controlled today.  He has no signs/symptoms concerning for endorgan damage at present.  Encouraged to  continue medications as prescribed.  Continue current dose of lisinopril-hydrochlorothiazide with changes per primary care.        Other   Human immunodeficiency virus (HIV) disease ([place]) - Primary    Mr. Coye appears to be doing well with his first month of [place] with no adverse side effects.  He has no signs/symptoms of opportunistic infection or progressive HIV disease.  Discussed being undetectable and U=U today.  Check blood work today.  Continue current dose of Biktarvy.  Plan for follow-up in 6 weeks or sooner if needed with lab work 1 to 2 weeks prior to appointment.      Relevant Orders  COMPLETE METABOLIC PANEL WITH GFR   CBC   T-helper cell (CD4)- ([facility] clinic only)   HIV-1 RNA quant-no reflex-bld   T-helper cell (CD4)- ([facility] clinic only)   Comprehensive metabolic panel   HIV-1 RNA quant-no reflex-bld   Healthcare maintenance     Prevnar updated today.  Encouraged to follow-up for colon cancer screening with colonoscopy.  Dental exam scheduled for August currently postponed secondary to coronavirus pandemic  Discussed importance of safe sexual practice to reduce risk of acquisition/transmission of STI.       Other Visit Diagnoses    Need for vaccination with 13-polyvalent pneumococcal conjugate vaccine       Relevant Orders   Pneumococcal conjugate vaccine 13-valent IM (Completed)       I have discontinued Sharon [place]'s clindamycin. I am also having him maintain his escitalopram, lisinopril-hydrochlorothiazide, atorvastatin, metFORMIN, bictegravir-emtricitabine-tenofovir AF, and traMADol.   No orders of the defined types were placed in this encounter.    Follow-up: Return in about 6 weeks (around 07/15/2019), or if symptoms worsen or fail to improve.   Terri Piedra, MSN, FNP-C Nurse Practitioner [facility] for Infectious Disease [place] number: [phone]

## 2019-06-04 LAB — T-HELPER CELL (CD4) - (RCID CLINIC ONLY)
CD4 % Helper T Cell: 34 % (ref 33–65)
CD4 T Cell Abs: 676 /uL (ref 400–1790)

## 2019-06-09 LAB — CBC
HCT: 43.7 % (ref 38.5–50.0)
Hemoglobin: 15.1 g/dL (ref 13.2–17.1)
MCH: 31.7 pg (ref 27.0–33.0)
MCHC: 34.6 g/dL (ref 32.0–36.0)
MCV: 91.8 fL (ref 80.0–100.0)
MPV: 9.7 fL (ref 7.5–12.5)
Platelets: 329 10*3/uL (ref 140–400)
RBC: 4.76 10*6/uL (ref 4.20–5.80)
RDW: 13 % (ref 11.0–15.0)
WBC: 6.6 10*3/uL (ref 3.8–10.8)

## 2019-06-09 LAB — COMPLETE METABOLIC PANEL WITH GFR
AG Ratio: 1.5 (calc) (ref 1.0–2.5)
ALT: 20 U/L (ref 9–46)
AST: 16 U/L (ref 10–35)
Albumin: 4.3 g/dL (ref 3.6–5.1)
Alkaline phosphatase (APISO): 49 U/L (ref 35–144)
BUN: 18 mg/dL (ref 7–25)
CO2: 28 mmol/L (ref 20–32)
Calcium: 9.9 mg/dL (ref 8.6–10.3)
Chloride: 98 mmol/L (ref 98–110)
Creat: 0.81 mg/dL (ref 0.70–1.25)
GFR, Est African American: 110 mL/min/{1.73_m2} (ref >=60)
GFR, Est Non African American: 95 mL/min/{1.73_m2} (ref >=60)
Globulin: 2.9 g/dL (calc) (ref 1.9–3.7)
Glucose, Bld: 104 mg/dL — ABNORMAL HIGH (ref 65–99)
Potassium: 4.2 mmol/L (ref 3.5–5.3)
Sodium: 136 mmol/L (ref 135–146)
Total Bilirubin: 0.4 mg/dL (ref 0.2–1.2)
Total Protein: 7.2 g/dL (ref 6.1–8.1)

## 2019-06-09 LAB — HIV-1 RNA QUANT-NO REFLEX-BLD
HIV 1 RNA Quant: 82 copies/mL — ABNORMAL HIGH
HIV-1 RNA Quant, Log: 1.91 Log copies/mL — ABNORMAL HIGH

## 2019-06-16 ENCOUNTER — Telehealth: Payer: Self-pay

## 2019-06-16 NOTE — Telephone Encounter (Signed)
Patient informed of below.  William Mcalpine, LPN

## 2019-06-16 NOTE — Telephone Encounter (Signed)
-----   Message from [place], [place] sent at 06/16/2019  3:02 PM EDT ----- Please inform William Mcfarland that his viral load is improved to 82 and his CD4 count is 676. Please continue to take Biktarvy and follow up in one month as scheduled.

## 2019-07-05 ENCOUNTER — Other Ambulatory Visit: Payer: BC Managed Care – PPO

## 2019-07-05 ENCOUNTER — Other Ambulatory Visit: Payer: Self-pay

## 2019-07-05 DIAGNOSIS — B20 Human immunodeficiency virus [HIV] disease: Secondary | ICD-10-CM

## 2019-07-06 LAB — T-HELPER CELL (CD4) - (RCID CLINIC ONLY)
CD4 % Helper T Cell: 36 % (ref 33–65)
CD4 T Cell Abs: 775 /uL (ref 400–1790)

## 2019-07-09 LAB — COMPREHENSIVE METABOLIC PANEL
AG Ratio: 1.4 (calc) (ref 1.0–2.5)
ALT: 21 U/L (ref 9–46)
AST: 15 U/L (ref 10–35)
Albumin: 4.1 g/dL (ref 3.6–5.1)
Alkaline phosphatase (APISO): 48 U/L (ref 35–144)
BUN: 18 mg/dL (ref 7–25)
CO2: 28 mmol/L (ref 20–32)
Calcium: 9.8 mg/dL (ref 8.6–10.3)
Chloride: 101 mmol/L (ref 98–110)
Creat: 0.87 mg/dL (ref 0.70–1.25)
Globulin: 2.9 g/dL (calc) (ref 1.9–3.7)
Glucose, Bld: 196 mg/dL — ABNORMAL HIGH (ref 65–99)
Potassium: 3.8 mmol/L (ref 3.5–5.3)
Sodium: 137 mmol/L (ref 135–146)
Total Bilirubin: 0.3 mg/dL (ref 0.2–1.2)
Total Protein: 7 g/dL (ref 6.1–8.1)

## 2019-07-09 LAB — HIV-1 RNA QUANT-NO REFLEX-BLD
HIV 1 RNA Quant: 24 copies/mL — ABNORMAL HIGH
HIV-1 RNA Quant, Log: 1.38 Log copies/mL — ABNORMAL HIGH

## 2019-07-15 ENCOUNTER — Telehealth: Payer: Self-pay | Admitting: Family

## 2019-07-15 NOTE — Telephone Encounter (Signed)
COVID-19 Pre-Screening Questions:  Do you currently have a fever (>100 F), chills or unexplained body aches? N   Are you currently experiencing new cough, shortness of breath, sore throat, runny nose? N    Have you recently travelled outside the state of [place] in the last 14 days? N   Have you been in contact with someone that is currently pending confirmation of Covid19 testing or has been confirmed to have the [place] virus?  N  **If the patient answers NO to ALL questions -  advise the patient to please call the clinic before coming to the office should any symptoms develop.    1.

## 2019-07-19 ENCOUNTER — Ambulatory Visit: Payer: BC Managed Care – PPO | Admitting: Family

## 2019-07-19 ENCOUNTER — Encounter: Payer: Self-pay | Admitting: Family

## 2019-07-19 ENCOUNTER — Other Ambulatory Visit: Payer: Self-pay

## 2019-07-19 VITALS — BP 172/84 | HR 81 | Temp 97.9°F

## 2019-07-19 DIAGNOSIS — Z0000 Encounter for general adult medical examination without abnormal findings: Secondary | ICD-10-CM | POA: Diagnosis not present

## 2019-07-19 DIAGNOSIS — B20 Human immunodeficiency virus [HIV] disease: Secondary | ICD-10-CM

## 2019-07-19 DIAGNOSIS — I10 Essential (primary) hypertension: Secondary | ICD-10-CM | POA: Diagnosis not present

## 2019-07-19 NOTE — Patient Instructions (Addendum)
Nice see you.   We will continue your Biktarvy as prescribed daily.  Refills are at the pharmacy.  Plan for follow up in 3 months or sooner if needed with lab work 1-2 weeks prior to your appointment.   Recommend getting flu shot in September.   Have a great day and stay safe!

## 2019-07-19 NOTE — Progress Notes (Signed)
Subjective:    Patient ID: William Mcfarland, male    DOB: [birth year], 63 y.o.   MRN: [id number]  Chief Complaint  Patient presents with  . HIV Positive/AIDS     HPI:  William Mcfarland is a 63 y.o. male with multiple medical conditions including HIV disease who was last seen in the office on 06/03/2019 with good adherence and tolerance to his ART regimen of Biktarvy. Blood work completed at the time showed a viral load of 82 and CD4 count of 676.  Most recent blood work completed on 07/05/2019 with viral load is undetectable and CD4 count of 775.  Blood glucose elevated at 196 with renal function, hepatic function, and electrolytes within normal ranges.  [name] continues to take his Biktarvy as prescribed no adverse side effects or missed doses.  Overall feeling well today. Denies fevers, chills, night sweats, headaches, changes in vision, neck pain/stiffness, nausea, diarrhea, vomiting, lesions or rashes.  [name] has no problems obtaining his medication from the pharmacy and remains covered through [facility] working full-time at [employer].  Denies feelings of being down, depressed, or hopeless recently.  No recreational or illicit drug use.  He does drink alcohol on occasion.  He remains a former tobacco user.  Not currently sexually active.   No Known Allergies    Outpatient Medications Prior to Visit  Medication Sig Dispense Refill  . atorvastatin (LIPITOR) 10 MG tablet Take 10 mg by mouth daily.    . bictegravir-emtricitabine-tenofovir AF (BIKTARVY) 50-200-25 MG TABS tablet Take 1 tablet by mouth daily. 30 tablet 5  . escitalopram (LEXAPRO) 10 MG tablet Take 10 mg by mouth daily.    Marland Kitchen lisinopril-hydrochlorothiazide (ZESTORETIC) 20-12.5 MG tablet Take 1 tablet by mouth 2 (two) times a day.    . metFORMIN (GLUCOPHAGE) 500 MG tablet Take 500 mg by mouth 2 (two) times daily with a meal.    . traMADol (ULTRAM) 50 MG tablet Take 1 tablet (50 mg total) by mouth  every 12 (twelve) hours as needed. (Patient not taking: Reported on 07/19/2019) 30 tablet 0   No facility-administered medications prior to visit.      Past Medical History:  Diagnosis Date  . Arthritis    shoulders, knees, hips  . At risk for sleep apnea    STOP-BANG= 5     SENT TO PCP 04-12-2015  . GERD (gastroesophageal reflux disease)   . History of colon polyps   . History of radiation therapy 45 Gy plus seed implants on 04-13-2015   prostate external beam 02-20-2015 to 03-24-2015  . Hyperlipidemia   . Hypertension   . Lower urinary tract symptoms (LUTS)   . Prostate cancer [facility]) urologist-  dr ottelin/  oncologist- dr Tammi Klippel   T1c, Gleason 4+3,  PSA 6.83,  vol. 32.56cc--  s/p  external beam radiation 02-20-2015 to 03-24-2015  . Type 2 diabetes mellitus ([place])   . Type 2 diabetes, diet controlled ([place])   . Wears glasses      Past Surgical History:  Procedure Laterality Date  . COLONOSCOPY W/ POLYPECTOMY  2013  . ORIF LEFT LEG FX  1988  . ORIF RIGHT ANKLE FX  1985  . PROSTATE BIOPSY    . RADIOACTIVE SEED IMPLANT N/A 04/14/2015   Procedure: RADIOACTIVE SEED IMPLANT;  Surgeon: Kathie Rhodes, MD;  Location: [facility];  Service: Urology;  Laterality: N/A;   64     seeds implanted no seeds found in bladder  .  TOTAL SHOULDER ARTHROPLASTY Right 05/21/2016   Procedure: TOTAL SHOULDER ARTHROPLASTY;  Surgeon: Meredith Pel, MD;  Location: [place];  Service: Orthopedics;  Laterality: Right;       Review of Systems  Constitutional: Negative for appetite change, chills, fatigue, fever and unexpected weight change.  Eyes: Negative for visual disturbance.  Respiratory: Negative for cough, chest tightness, shortness of breath and wheezing.   Cardiovascular: Negative for chest pain and leg swelling.  Gastrointestinal: Negative for abdominal pain, constipation, diarrhea, nausea and vomiting.  Genitourinary: Negative for dysuria, flank pain, frequency, genital sores,  hematuria and urgency.  Skin: Negative for rash.  Allergic/Immunologic: Negative for immunocompromised state.  Neurological: Negative for dizziness and headaches.      Objective:    BP (!) 172/84   Pulse 81   Temp 97.9 F (36.6 C)  Nursing note and vital signs reviewed.  Physical Exam Constitutional:      General: He is not in acute distress.    Appearance: He is well-developed.  Eyes:     Conjunctiva/sclera: Conjunctivae normal.  Neck:     Musculoskeletal: Neck supple.  Cardiovascular:     Rate and Rhythm: Normal rate and regular rhythm.     Heart sounds: Normal heart sounds. No murmur. No friction rub. No gallop.   Pulmonary:     Effort: Pulmonary effort is normal. No respiratory distress.     Breath sounds: Normal breath sounds. No wheezing or rales.  Chest:     Chest wall: No tenderness.  Abdominal:     General: Bowel sounds are normal.     Palpations: Abdomen is soft.     Tenderness: There is no abdominal tenderness.  Lymphadenopathy:     Cervical: No cervical adenopathy.  Skin:    General: Skin is warm and dry.     Findings: No rash.  Neurological:     Mental Status: He is alert and oriented to person, place, and time.  Psychiatric:        Behavior: Behavior normal.        Thought Content: Thought content normal.        Judgment: Judgment normal.      Depression screen [facility] 2/9 07/19/2019 05/03/2019 03/03/2015 02/24/2015 11/21/2014  Decreased Interest 0 1 0 0 0  Down, Depressed, Hopeless 0 1 0 0 0  PHQ - 2 Score 0 2 0 0 0  Altered sleeping - 1 - - -  Tired, decreased energy - 1 - - -  Change in appetite - 1 - - -  Feeling bad or failure about yourself  - 0 - - -  Trouble concentrating - 0 - - -  Moving slowly or fidgety/restless - 0 - - -  Suicidal thoughts - 0 - - -  PHQ-9 Score - 5 - - -       Assessment & Plan:   Problem List Items Addressed This Visit      Cardiovascular and Mediastinum   Essential hypertension    Blood pressure elevated above  goal 140/90 however on retake was 140/88 manually.  Discussed importance of blood pressure control to reduce risk of further cardiovascular damage.  No headaches or changes in vision today.  Encouraged to monitor blood pressure at home as able.  Continue current dose of lisinopril-hydrochlorothiazide with adjustments as needed per primary care.        Other   Human immunodeficiency virus (HIV) disease ([place]) - Primary    Mr. Kimmey has well-controlled HIV disease with  good adherence and tolerance to his ART regimen of Biktarvy.  No signs/symptoms of opportunistic infection or progressive HIV disease at present.  Discussed importance of taking medication as prescribed.  He has no problems obtaining his medication from the pharmacy. Continue current dose of Biktarvy.  Plan for follow-up in 3 months or sooner if needed with lab work 1 to 2 weeks prior to appointment.      Relevant Orders   T-helper cell (CD4)- ([facility] clinic only)   CBC   Comprehensive metabolic panel   Healthcare maintenance     Discussed importance of safe sexual practice to reduce risk of acquisition/transmission of STI.  Declines condoms today.  Due for Pneumovax at next office visit.          I am having Lacie Draft maintain his escitalopram, lisinopril-hydrochlorothiazide, atorvastatin, metFORMIN, bictegravir-emtricitabine-tenofovir AF, and traMADol.   Follow-up: Return in about 3 months (around 10/19/2019).   Terri Piedra, MSN, FNP-C Nurse Practitioner [facility] for Infectious Disease [place] number: [phone]

## 2019-07-19 NOTE — Assessment & Plan Note (Signed)
Mr. Nelles has well-controlled HIV disease with good adherence and tolerance to his ART regimen of Biktarvy.  No signs/symptoms of opportunistic infection or progressive HIV disease at present.  Discussed importance of taking medication as prescribed.  He has no problems obtaining his medication from the pharmacy. Continue current dose of Biktarvy.  Plan for follow-up in 3 months or sooner if needed with lab work 1 to 2 weeks prior to appointment.

## 2019-07-19 NOTE — Assessment & Plan Note (Signed)
Blood pressure elevated above goal 140/90 however on retake was 140/88 manually.  Discussed importance of blood pressure control to reduce risk of further cardiovascular damage.  No headaches or changes in vision today.  Encouraged to monitor blood pressure at home as able.  Continue current dose of lisinopril-hydrochlorothiazide with adjustments as needed per primary care.

## 2019-07-19 NOTE — Assessment & Plan Note (Signed)
   Discussed importance of safe sexual practice to reduce risk of acquisition/transmission of STI.  Declines condoms today.  Due for Pneumovax at next office visit.

## 2019-07-28 ENCOUNTER — Telehealth: Payer: Self-pay | Admitting: Orthopedic Surgery

## 2019-07-28 NOTE — Telephone Encounter (Signed)
Returned call to patient left message to return call to schedule an appointment for his left shoulder with Dr Marlou Sa

## 2019-08-02 ENCOUNTER — Ambulatory Visit (INDEPENDENT_AMBULATORY_CARE_PROVIDER_SITE_OTHER): Payer: BC Managed Care – PPO | Admitting: Orthopedic Surgery

## 2019-08-02 ENCOUNTER — Encounter: Payer: Self-pay | Admitting: Orthopedic Surgery

## 2019-08-02 DIAGNOSIS — M19012 Primary osteoarthritis, left shoulder: Secondary | ICD-10-CM

## 2019-08-02 MED ORDER — TRAMADOL HCL 50 MG PO TABS: ORAL_TABLET | ORAL | 0 refills | Status: DC

## 2019-08-04 ENCOUNTER — Encounter: Payer: Self-pay | Admitting: Orthopedic Surgery

## 2019-08-04 NOTE — Progress Notes (Signed)
Office Visit Note   Patient: William Mcfarland           Date of Birth: 04-Jul-1956           MRN: [id number] Visit Date: 08/02/2019 Requested by: Kathyrn Lass, [place],  [place] [address] PCP: Kathyrn Lass, MD  Subjective: Chief Complaint  Patient presents with  . Left Shoulder - Pain    HPI: William Mcfarland is a patient with left shoulder pain.  He has known left shoulder arthritis.  He is doing reasonably well with right total shoulder replacement.  He states the pain in his left shoulder is getting worse.  The pain is now more acute.  He is unable to lay on that side.  Reports catching and grinding.  Last shot in January of this year only gave him 1 week of relief.  He would like to get the shoulder replaced but he sitting out the COVID crisis for now.  Last hemoglobin A1c was 7.2.  Tramadol does help his pain.              ROS: All systems reviewed are negative as they relate to the chief complaint within the history of present illness.  Patient denies  fevers or chills.   Assessment & Plan: Visit Diagnoses:  1. Arthritis of left shoulder region     Plan: Impression is left shoulder arthritis with worsening symptoms and diminishing returns on intra-articular cortisone injection.  Plan is to continue to have him stretch as much as possible.  We will refill tramadol today.  Follow-up at the beginning of the year and we can repeat evaluate him clinically.  He states he is getting closer to the time where he would want to get his shoulder replaced.  We would need a CT scan prior to that.  That would be for patient specific instrumentation.  Follow-Up Instructions: Return in about 6 months (around 02/02/2020).   Orders:  No orders of the defined types were placed in this encounter.  Meds ordered this encounter  Medications  . traMADol (ULTRAM) 50 MG tablet    Sig: 1 po q 12hrs prn pain    Dispense:  40 tablet    Refill:  0      Procedures: No procedures  performed   Clinical Data: No additional findings.  Objective: Vital Signs: There were no vitals taken for this visit.  Physical Exam:   Constitutional: Patient appears well-developed HEENT:  Head: Normocephalic Eyes:EOM are normal Neck: Normal range of motion Cardiovascular: Normal rate Pulmonary/chest: Effort normal Neurologic: Patient is alert Skin: Skin is warm Psychiatric: Patient has normal mood and affect    Ortho Exam: Ortho exam demonstrates full active and passive range of motion of the elbow and wrist on the left-hand side.  External rotation of 15 degrees of abduction is about 20 degrees.  He does have about 90 of forward flexion and 85 of abduction on the left.  Has better range of motion on the right.  Deltoid is functional.  Motor sensory function to the hand is intact.  Rotator cuff strength also feels pretty good to infraspinatus supraspinatus and subscap muscle testing.  Specialty Comments:  No specialty comments available.  Imaging: No results found.   PMFS History: Patient Active Problem List   Diagnosis Date Noted  . Healthcare maintenance 06/03/2019  . Human immunodeficiency virus (HIV) disease ([place]) 05/03/2019  . Type 2 diabetes mellitus with hyperglycemia, without long-term current use of insulin ([place]) 05/03/2019  .  Essential hypertension 05/03/2019  . Class 1 obesity due to excess calories with serious comorbidity and body mass index (BMI) of 32.0 to 32.9 in adult 05/03/2019  . Degenerative arthritis of shoulder region 05/21/2016  . Malignant neoplasm of prostate ([place]) 11/20/2014  . Travel advice encounter 04/01/2014   Past Medical History:  Diagnosis Date  . Arthritis    shoulders, knees, hips  . At risk for sleep apnea    STOP-BANG= 5     SENT TO PCP 04-12-2015  . GERD (gastroesophageal reflux disease)   . History of colon polyps   . History of radiation therapy 45 Gy plus seed implants on 04-13-2015   prostate external beam 02-20-2015  to 03-24-2015  . Hyperlipidemia   . Hypertension   . Lower urinary tract symptoms (LUTS)   . Prostate cancer [facility]) urologist-  dr ottelin/  oncologist- dr Tammi Klippel   T1c, Gleason 4+3,  PSA 6.83,  vol. 32.56cc--  s/p  external beam radiation 02-20-2015 to 03-24-2015  . Type 2 diabetes mellitus ([place])   . Type 2 diabetes, diet controlled ([place])   . Wears glasses     Family History  Problem Relation Age of Onset  . Stroke Father   . Stroke Mother   . Cancer Neg Hx     Past Surgical History:  Procedure Laterality Date  . COLONOSCOPY W/ POLYPECTOMY  2013  . ORIF LEFT LEG FX  1988  . ORIF RIGHT ANKLE FX  1985  . PROSTATE BIOPSY    . RADIOACTIVE SEED IMPLANT N/A 04/14/2015   Procedure: RADIOACTIVE SEED IMPLANT;  Surgeon: Kathie Rhodes, MD;  Location: [facility];  Service: Urology;  Laterality: N/A;   64     seeds implanted no seeds found in bladder  . TOTAL SHOULDER ARTHROPLASTY Right 05/21/2016   Procedure: TOTAL SHOULDER ARTHROPLASTY;  Surgeon: Meredith Pel, MD;  Location: [place];  Service: Orthopedics;  Laterality: Right;   Social History   Occupational History  . Occupation: Air traffic controller  Tobacco Use  . Smoking status: Former Smoker    Packs/day: 0.50    Years: 15.00    Pack years: 7.50    Types: Cigarettes    Quit date: 04/12/1999    Years since quitting: 20.3  . Smokeless tobacco: Never Used  Substance and Sexual Activity  . Alcohol use: Yes    Comment: occasional  . Drug use: No  . Sexual activity: Not on file

## 2019-08-09 ENCOUNTER — Telehealth: Payer: Self-pay

## 2019-08-09 DIAGNOSIS — E1165 Type 2 diabetes mellitus with hyperglycemia: Secondary | ICD-10-CM

## 2019-08-09 NOTE — Telephone Encounter (Signed)
Received call today from Charlean Sanfilippo at Dr. Ammie Ferrier office asking to see if we would be able to do A1C when patient comes in for labs. PCP would like to avoid sticking patient twice if possible. Will route message to MD to advise if it is okay to add lab.  Manuela Schwartz, [place], [place]

## 2019-08-09 NOTE — Telephone Encounter (Signed)
Yes, that is fine to add A1c. Thanks.

## 2019-08-10 NOTE — Telephone Encounter (Signed)
Office called back to see if this is ok.  RN added order, confirmed DR Blizzard's fax number to send office note/lab result (correct in care team). Landis Gandy, RN

## 2019-08-10 NOTE — Addendum Note (Signed)
Addended by: Landis Gandy on: 08/10/2019 09:58 AM   Modules accepted: Orders

## 2019-08-13 ENCOUNTER — Other Ambulatory Visit: Payer: Self-pay

## 2019-08-13 ENCOUNTER — Telehealth: Payer: Self-pay | Admitting: Orthopedic Surgery

## 2019-08-13 DIAGNOSIS — M25512 Pain in left shoulder: Secondary | ICD-10-CM

## 2019-08-13 NOTE — Telephone Encounter (Signed)
I will put in order for scan.

## 2019-08-13 NOTE — Telephone Encounter (Signed)
Patient would like to ahead and schedule his total shoulder before the end of the year.  He may retiring in April of 2021.  Surgery sheet initiated. Patient will need referral for scan.

## 2019-08-26 ENCOUNTER — Ambulatory Visit
Admission: RE | Admit: 2019-08-26 | Discharge: 2019-08-26 | Disposition: A | Discharge status: Home / Self Care | Payer: BC Managed Care – PPO | Source: Ambulatory Visit | Attending: Orthopedic Surgery | Admitting: Orthopedic Surgery

## 2019-08-26 DIAGNOSIS — M25512 Pain in left shoulder: Secondary | ICD-10-CM

## 2019-09-08 ENCOUNTER — Other Ambulatory Visit: Payer: Self-pay

## 2019-09-24 NOTE — Progress Notes (Signed)
[place] [id number] [place], [place] [address] [place] [address] Phone: [phone] Fax: [phone]  [place], [place] [address] Phone: [phone] Fax: [phone]  [facility], [place] [address] [place] [address] Phone: [phone] Fax: [phone]      Your procedure is scheduled on Tuesday, October 20th.  Report to [facility] Main Entrance "A" at 5:30 A.M., and check in at the Admitting office.  Call this number if you have problems the morning of surgery:  [phone]  Call [phone] if you have any questions prior to your surgery date Monday-Friday 8am-4pm    Remember:  Do not eat after midnight the night before your surgery  You may drink clear liquids until 4:30 AM the morning of your surgery.   Clear liquids allowed are: Water, Non-Citrus Juices (without pulp), Carbonated Beverages, Clear Tea, Black Coffee Only, and Gatorade   Enhanced Recovery after Surgery for Orthopedics Enhanced Recovery after Surgery is a protocol used to improve the stress on your body and your recovery after surgery.  Patient Instructions  . The night before surgery:  o No food after midnight. ONLY clear liquids after midnight  . The day of surgery (if you have diabetes): o  o Drink ONE (1) Gatorade 2 (G2) as directed. o This drink was given to you during your hospital  pre-op appointment visit.  o The pre-op nurse will instruct you on the time to drink the   Gatorade 2 (G2) depending on your surgery time. o Color of the Gatorade may vary. Red is not allowed. o Nothing else to drink after completing the  Gatorade 2 (G2).         If you have questions, please contact your surgeon's office.     Take these medicines the morning of surgery with A  SIP OF WATER   Atorvastatin (Lipitor)  Biktarvy  Escitalopram (Lexapro)    7 days prior to surgery STOP taking any Aspirin (unless otherwise instructed by your surgeon), Aleve, Naproxen, Ibuprofen, Motrin, Advil, Goody's, BC's, all herbal medications, fish oil, and all vitamins.   WHAT DO I DO ABOUT MY DIABETES MEDICATION?   Marland Kitchen Do not take oral diabetes medicines (pills) the morning of surgery. - Metformin   How to Manage Your Diabetes Before and After Surgery  Why is it important to control my blood sugar before and after surgery? . Improving blood sugar levels before and after surgery helps healing and can limit problems. . A way of improving blood sugar control is eating a healthy diet by: o  Eating less sugar and carbohydrates o  Increasing activity/exercise o  Talking with your doctor about reaching your blood sugar goals . High blood sugars (greater than 180 mg/dL) can raise your risk of infections and slow your recovery, so you will need to focus on controlling your diabetes during the weeks before surgery. . Make sure that the doctor who takes care of your diabetes knows about your planned surgery including the date and location.  How do I manage my blood sugar before surgery? . Check your blood sugar at least 4 times a day, starting 2 days before surgery, to make sure that the level is not too high or low. o Check your blood sugar the morning of your surgery  when you wake up and every 2 hours until you get to the Short Stay unit. . If your blood sugar is less than 70 mg/dL, you will need to treat for low blood sugar: o Do not take insulin. o Treat a low blood sugar (less than 70 mg/dL) with  cup of clear juice (cranberry or apple), 4 glucose tablets, OR glucose gel. o Recheck blood sugar in 15 minutes after treatment (to make sure it is greater than 70 mg/dL). If your blood sugar is not greater than 70 mg/dL on recheck, call [phone] for further instructions. . Report  your blood sugar to the short stay nurse when you get to Short Stay.  . If you are admitted to the hospital after surgery: o Your blood sugar will be checked by the staff and you will probably be given insulin after surgery (instead of oral diabetes medicines) to make sure you have good blood sugar levels. o The goal for blood sugar control after surgery is 80-180 mg/dL.    The Morning of Surgery  Do not wear jewelry.  Do not wear lotions, powders, colognes, or deodorant  Men may shave face and neck.  Do not bring valuables to the hospital.  [facility] is not responsible for any belongings or valuables.  If you are a smoker, DO NOT Smoke 24 hours prior to surgery IF you wear a CPAP at night please bring your mask, tubing, and machine the morning of surgery   Remember that you must have someone to transport you home after your surgery, and remain with you for 24 hours if you are discharged the same day.   Contacts, glasses, hearing aids, dentures or bridgework may not be worn into surgery.    Leave your suitcase in the car.  After surgery it may be brought to your room.  For patients admitted to the hospital, discharge time will be determined by your treatment team.  Patients discharged the day of surgery will not be allowed to drive home.    Special instructions:   [place]- Preparing For Surgery  Before surgery, you can play an important role. Because skin is not sterile, your skin needs to be as free of germs as possible. You can reduce the number of germs on your skin by washing with CHG (chlorahexidine gluconate) Soap before surgery.  CHG is an antiseptic cleaner which kills germs and bonds with the skin to continue killing germs even after washing.    Oral Hygiene is also important to reduce your risk of infection.  Remember - BRUSH YOUR TEETH THE MORNING OF SURGERY WITH YOUR REGULAR TOOTHPASTE  Please do not use if you have an allergy to CHG or antibacterial soaps. If  your skin becomes reddened/irritated stop using the CHG.  Do not shave (including legs and underarms) for at least 48 hours prior to first CHG shower. It is OK to shave your face.  Please follow these instructions carefully.   1. Shower the NIGHT BEFORE SURGERY and the MORNING OF SURGERY with CHG Soap.   2. If you chose to wash your hair, wash your hair first as usual with your normal shampoo.  3. After you shampoo, rinse your hair and body thoroughly to remove the shampoo.  4. Use CHG as you would any other liquid soap. You can apply CHG directly to the skin and wash gently with a scrungie or a clean washcloth.   5. Apply the CHG Soap to your body ONLY FROM THE NECK  DOWN.  Do not use on open wounds or open sores. Avoid contact with your eyes, ears, mouth and genitals (private parts). Wash Face and genitals (private parts)  with your normal soap.   6. Wash thoroughly, paying special attention to the area where your surgery will be performed.  7. Thoroughly rinse your body with warm water from the neck down.  8. DO NOT shower/wash with your normal soap after using and rinsing off the CHG Soap.  9. Pat yourself dry with a CLEAN TOWEL.  10. Wear CLEAN PAJAMAS to bed the night before surgery, wear comfortable clothes the morning of surgery  11. Place CLEAN SHEETS on your bed the night of your first shower and DO NOT SLEEP WITH PETS.    Day of Surgery:  Do not apply any deodorants/lotions. Please shower the morning of surgery with the CHG soap  Please wear clean clothes to the hospital/surgery center.   Remember to brush your teeth WITH YOUR REGULAR TOOTHPASTE.   Please read over the following fact sheets that you were given.

## 2019-09-27 ENCOUNTER — Encounter (HOSPITAL_COMMUNITY): Payer: Self-pay

## 2019-09-27 ENCOUNTER — Other Ambulatory Visit: Payer: Self-pay

## 2019-09-27 ENCOUNTER — Encounter (HOSPITAL_COMMUNITY)
Admission: RE | Admit: 2019-09-27 | Discharge: 2019-09-27 | Disposition: A | Discharge status: Home / Self Care | Payer: BC Managed Care – PPO | Source: Ambulatory Visit | Attending: Orthopedic Surgery | Admitting: Orthopedic Surgery

## 2019-09-27 DIAGNOSIS — E118 Type 2 diabetes mellitus with unspecified complications: Secondary | ICD-10-CM | POA: Insufficient documentation

## 2019-09-27 DIAGNOSIS — I10 Essential (primary) hypertension: Secondary | ICD-10-CM | POA: Insufficient documentation

## 2019-09-27 DIAGNOSIS — Z01818 Encounter for other preprocedural examination: Secondary | ICD-10-CM | POA: Diagnosis not present

## 2019-09-27 LAB — BASIC METABOLIC PANEL
Anion gap: 12 (ref 5–15)
BUN: 14 mg/dL (ref 8–23)
CO2: 24 mmol/L (ref 22–32)
Calcium: 9.7 mg/dL (ref 8.9–10.3)
Chloride: 100 mmol/L (ref 98–111)
Creatinine, Ser: 0.83 mg/dL (ref 0.61–1.24)
GFR calc Af Amer: >60 mL/min (ref >60)
GFR calc non Af Amer: >60 mL/min (ref >60)
Glucose, Bld: 100 mg/dL — ABNORMAL HIGH (ref 70–99)
Potassium: 4 mmol/L (ref 3.5–5.1)
Sodium: 136 mmol/L (ref 135–145)

## 2019-09-27 LAB — URINALYSIS, ROUTINE W REFLEX MICROSCOPIC
Bacteria, UA: NONE SEEN
Bilirubin Urine: NEGATIVE
Glucose, UA: NEGATIVE mg/dL
Ketones, ur: NEGATIVE mg/dL
Leukocytes,Ua: NEGATIVE
Nitrite: NEGATIVE
Protein, ur: NEGATIVE mg/dL
Specific Gravity, Urine: 1.01 (ref 1.005–1.030)
pH: 6 (ref 5.0–8.0)

## 2019-09-27 LAB — CBC
HCT: 47.3 % (ref 39.0–52.0)
Hemoglobin: 16.3 g/dL (ref 13.0–17.0)
MCH: 32.4 pg (ref 26.0–34.0)
MCHC: 34.5 g/dL (ref 30.0–36.0)
MCV: 94 fL (ref 80.0–100.0)
Platelets: 363 10*3/uL (ref 150–400)
RBC: 5.03 MIL/uL (ref 4.22–5.81)
RDW: 12.6 % (ref 11.5–15.5)
WBC: 7.5 10*3/uL (ref 4.0–10.5)
nRBC: 0 % (ref 0.0–0.2)

## 2019-09-27 LAB — SURGICAL PCR SCREEN
MRSA, PCR: NEGATIVE
Staphylococcus aureus: POSITIVE — AB

## 2019-09-27 LAB — GLUCOSE, CAPILLARY: Glucose-Capillary: 108 mg/dL — ABNORMAL HIGH (ref 70–99)

## 2019-09-27 LAB — HEMOGLOBIN A1C
Hgb A1c MFr Bld: 6.3 % — ABNORMAL HIGH (ref 4.8–5.6)
Mean Plasma Glucose: 134.11 mg/dL

## 2019-09-27 NOTE — Progress Notes (Signed)
PCR: + Staph  Left voicemail for patient.    Prescription called in to [facility] (09-27-19 @ 1525)

## 2019-09-27 NOTE — Progress Notes (Signed)
PCP - Dr. Kathyrn Lass [facility])  Chest x-ray - n/a EKG - 09-27-19   DM - Type 2, pt states he does not check sugars  ERAS Protcol - yes, Given G2  COVID TEST- 10-05-19   Anesthesia review: n/a  Patient denies shortness of breath, fever, cough and chest pain at PAT appointment   Patient verbalized understanding of instructions that were given to them at the PAT appointment. Patient was also instructed that they will need to review over the PAT instructions again at home before surgery.

## 2019-09-28 LAB — URINE CULTURE: Culture: NO GROWTH

## 2019-10-01 ENCOUNTER — Other Ambulatory Visit (HOSPITAL_COMMUNITY)
Admission: RE | Admit: 2019-10-01 | Discharge: 2019-10-01 | Disposition: A | Discharge status: Home / Self Care | Payer: BC Managed Care – PPO | Source: Ambulatory Visit | Attending: Orthopedic Surgery | Admitting: Orthopedic Surgery

## 2019-10-01 DIAGNOSIS — Z20828 Contact with and (suspected) exposure to other viral communicable diseases: Secondary | ICD-10-CM | POA: Insufficient documentation

## 2019-10-01 DIAGNOSIS — Z01812 Encounter for preprocedural laboratory examination: Secondary | ICD-10-CM | POA: Insufficient documentation

## 2019-10-02 LAB — NOVEL CORONAVIRUS, NAA (HOSP ORDER, SEND-OUT TO REF LAB; TAT 18-24 HRS): SARS-CoV-2, NAA: NOT DETECTED

## 2019-10-04 ENCOUNTER — Other Ambulatory Visit: Payer: BC Managed Care – PPO

## 2019-10-04 ENCOUNTER — Other Ambulatory Visit: Payer: Self-pay | Admitting: *Deleted

## 2019-10-04 ENCOUNTER — Other Ambulatory Visit: Payer: Self-pay

## 2019-10-04 DIAGNOSIS — B20 Human immunodeficiency virus [HIV] disease: Secondary | ICD-10-CM

## 2019-10-04 DIAGNOSIS — E1165 Type 2 diabetes mellitus with hyperglycemia: Secondary | ICD-10-CM

## 2019-10-04 NOTE — Anesthesia Preprocedure Evaluation (Addendum)
Anesthesia Evaluation  Patient identified by MRN, date of birth, ID band Patient awake    Reviewed: Allergy & Precautions, NPO status , Patient's Chart, lab work & pertinent test results  Airway Mallampati: II  TM Distance: >3 FB Neck ROM: Full    Dental  (+) Teeth Intact, Dental Advisory Given, Implants,    Pulmonary former smoker,    Pulmonary exam normal breath sounds clear to auscultation       Cardiovascular hypertension, Pt. on medications Normal cardiovascular exam Rhythm:Regular Rate:Normal     Neuro/Psych negative neurological ROS     GI/Hepatic negative GI ROS, Neg liver ROS,   Endo/Other  diabetes, Type 2, Oral Hypoglycemic AgentsObesity   Renal/GU negative Renal ROS   Prostate cancer     Musculoskeletal  (+) Arthritis , Osteoarthritis,    Abdominal   Peds  Hematology negative hematology ROS (+)   Anesthesia Other Findings Day of surgery medications reviewed with the patient.  Reproductive/Obstetrics                            Anesthesia Physical Anesthesia Plan  ASA: III  Anesthesia Plan: General   Post-op Pain Management:  Regional for Post-op pain   Induction: Intravenous  PONV Risk Score and Plan: 2 and Midazolam, Dexamethasone and Ondansetron  Airway Management Planned: Oral ETT  Additional Equipment:   Intra-op Plan:   Post-operative Plan: Extubation in OR  Informed Consent: I have reviewed the patients History and Physical, chart, labs and discussed the procedure including the risks, benefits and alternatives for the proposed anesthesia with the patient or authorized representative who has indicated his/her understanding and acceptance.     Dental advisory given  Plan Discussed with: CRNA  Anesthesia Plan Comments:        Anesthesia Quick Evaluation

## 2019-10-05 ENCOUNTER — Encounter (HOSPITAL_COMMUNITY): Payer: Self-pay | Admitting: *Deleted

## 2019-10-05 ENCOUNTER — Other Ambulatory Visit: Payer: Self-pay

## 2019-10-05 ENCOUNTER — Inpatient Hospital Stay (HOSPITAL_COMMUNITY): Payer: BC Managed Care – PPO

## 2019-10-05 ENCOUNTER — Inpatient Hospital Stay (HOSPITAL_COMMUNITY): Payer: BC Managed Care – PPO | Admitting: Certified Registered Nurse Anesthetist

## 2019-10-05 ENCOUNTER — Encounter (HOSPITAL_COMMUNITY)
Admission: AD | Disposition: A | Discharge status: Home / Self Care | Payer: Self-pay | Source: Home / Self Care | Attending: Orthopedic Surgery

## 2019-10-05 ENCOUNTER — Inpatient Hospital Stay (HOSPITAL_COMMUNITY)
Admission: AD | Admit: 2019-10-05 | Discharge: 2019-10-06 | DRG: 483 | Disposition: A | Discharge status: Home / Self Care | Payer: BC Managed Care – PPO | Attending: Orthopedic Surgery | Admitting: Orthopedic Surgery

## 2019-10-05 DIAGNOSIS — Z87891 Personal history of nicotine dependence: Secondary | ICD-10-CM

## 2019-10-05 DIAGNOSIS — Z7984 Long term (current) use of oral hypoglycemic drugs: Secondary | ICD-10-CM | POA: Diagnosis not present

## 2019-10-05 DIAGNOSIS — Z96611 Presence of right artificial shoulder joint: Secondary | ICD-10-CM | POA: Diagnosis present

## 2019-10-05 DIAGNOSIS — Z923 Personal history of irradiation: Secondary | ICD-10-CM

## 2019-10-05 DIAGNOSIS — I10 Essential (primary) hypertension: Secondary | ICD-10-CM | POA: Diagnosis present

## 2019-10-05 DIAGNOSIS — Z79899 Other long term (current) drug therapy: Secondary | ICD-10-CM | POA: Diagnosis not present

## 2019-10-05 DIAGNOSIS — M19012 Primary osteoarthritis, left shoulder: Secondary | ICD-10-CM | POA: Diagnosis present

## 2019-10-05 DIAGNOSIS — Z8546 Personal history of malignant neoplasm of prostate: Secondary | ICD-10-CM

## 2019-10-05 DIAGNOSIS — E785 Hyperlipidemia, unspecified: Secondary | ICD-10-CM | POA: Diagnosis present

## 2019-10-05 DIAGNOSIS — E119 Type 2 diabetes mellitus without complications: Secondary | ICD-10-CM | POA: Diagnosis present

## 2019-10-05 DIAGNOSIS — Z23 Encounter for immunization: Secondary | ICD-10-CM | POA: Diagnosis not present

## 2019-10-05 DIAGNOSIS — M19019 Primary osteoarthritis, unspecified shoulder: Secondary | ICD-10-CM | POA: Diagnosis present

## 2019-10-05 DIAGNOSIS — Z9889 Other specified postprocedural states: Secondary | ICD-10-CM

## 2019-10-05 HISTORY — PX: TOTAL SHOULDER ARTHROPLASTY: SHX126

## 2019-10-05 LAB — GLUCOSE, CAPILLARY
Glucose-Capillary: 137 mg/dL — ABNORMAL HIGH (ref 70–99)
Glucose-Capillary: 221 mg/dL — ABNORMAL HIGH (ref 70–99)
Glucose-Capillary: 210 mg/dL — ABNORMAL HIGH (ref 70–99)

## 2019-10-05 LAB — CBC
HCT: 41.6 % (ref 38.5–50.0)
Hemoglobin: 14.7 g/dL (ref 13.2–17.1)
MCH: 32.6 pg (ref 27.0–33.0)
MCHC: 35.3 g/dL (ref 32.0–36.0)
MCV: 92.2 fL (ref 80.0–100.0)
MPV: 9.6 fL (ref 7.5–12.5)
Platelets: 326 10*3/uL (ref 140–400)
RBC: 4.51 10*6/uL (ref 4.20–5.80)
RDW: 12.2 % (ref 11.0–15.0)
WBC: 6.5 10*3/uL (ref 3.8–10.8)

## 2019-10-05 LAB — COMPREHENSIVE METABOLIC PANEL
AG Ratio: 1.6 (calc) (ref 1.0–2.5)
ALT: 20 U/L (ref 9–46)
AST: 15 U/L (ref 10–35)
Albumin: 4.1 g/dL (ref 3.6–5.1)
Alkaline phosphatase (APISO): 49 U/L (ref 35–144)
BUN: 15 mg/dL (ref 7–25)
CO2: 31 mmol/L (ref 20–32)
Calcium: 9.4 mg/dL (ref 8.6–10.3)
Chloride: 100 mmol/L (ref 98–110)
Creat: 0.73 mg/dL (ref 0.70–1.25)
Globulin: 2.5 g/dL (calc) (ref 1.9–3.7)
Glucose, Bld: 165 mg/dL — ABNORMAL HIGH (ref 65–99)
Potassium: 4.3 mmol/L (ref 3.5–5.3)
Sodium: 138 mmol/L (ref 135–146)
Total Bilirubin: 0.4 mg/dL (ref 0.2–1.2)
Total Protein: 6.6 g/dL (ref 6.1–8.1)

## 2019-10-05 LAB — HEMOGLOBIN A1C
Hgb A1c MFr Bld: 6.3 % of total Hgb — ABNORMAL HIGH (ref <5.7)
Mean Plasma Glucose: 134 (calc)
eAG (mmol/L): 7.4 (calc)

## 2019-10-05 LAB — T-HELPER CELL (CD4) - (RCID CLINIC ONLY)
CD4 % Helper T Cell: 38 % (ref 33–65)
CD4 T Cell Abs: 792 /uL (ref 400–1790)

## 2019-10-05 SURGERY — ARTHROPLASTY, SHOULDER, TOTAL
Anesthesia: General | Site: Shoulder | Laterality: Left

## 2019-10-05 MED ORDER — MIDAZOLAM HCL 2 MG/2ML IJ SOLN
INTRAMUSCULAR | Status: DC | PRN
  Administered 2019-10-05: 2 mg via INTRAVENOUS

## 2019-10-05 MED ORDER — METOCLOPRAMIDE HCL 5 MG PO TABS: 5.0000 mg | ORAL_TABLET | Freq: Three times a day (TID) | ORAL | Status: DC | PRN

## 2019-10-05 MED ORDER — ONDANSETRON HCL 4 MG/2ML IJ SOLN
INTRAMUSCULAR | Status: DC | PRN
  Administered 2019-10-05: 4 mg via INTRAVENOUS

## 2019-10-05 MED ORDER — ACETAMINOPHEN 325 MG PO TABS: 325.0000 mg | ORAL_TABLET | Freq: Four times a day (QID) | ORAL | Status: DC | PRN

## 2019-10-05 MED ORDER — VANCOMYCIN HCL 1000 MG IV SOLR
INTRAVENOUS | Status: DC | PRN
  Administered 2019-10-05: 1000 mg

## 2019-10-05 MED ORDER — ACETAMINOPHEN 500 MG PO TABS
1000.0000 mg | ORAL_TABLET | Freq: Once | ORAL | Status: AC
  Administered 2019-10-05: 1000 mg via ORAL
  Filled 2019-10-05: qty 2

## 2019-10-05 MED ORDER — TRANEXAMIC ACID-NACL 1000-0.7 MG/100ML-% IV SOLN
1000.0000 mg | INTRAVENOUS | Status: AC
  Administered 2019-10-05: 08:00:00 1000 mg via INTRAVENOUS
  Filled 2019-10-05: qty 100

## 2019-10-05 MED ORDER — ONDANSETRON HCL 4 MG PO TABS: 4.0000 mg | ORAL_TABLET | Freq: Four times a day (QID) | ORAL | Status: DC | PRN

## 2019-10-05 MED ORDER — FENTANYL CITRATE (PF) 250 MCG/5ML IJ SOLN
INTRAMUSCULAR | Status: DC | PRN
  Administered 2019-10-05 (×3): 50 ug via INTRAVENOUS
  Administered 2019-10-05 (×2): 25 ug via INTRAVENOUS
  Administered 2019-10-05: 50 ug via INTRAVENOUS

## 2019-10-05 MED ORDER — IRRISEPT - 450ML BOTTLE WITH 0.05% CHG IN STERILE WATER, USP 99.95% OPTIME
TOPICAL | Status: DC | PRN
  Administered 2019-10-05: 450 mL

## 2019-10-05 MED ORDER — MENTHOL 3 MG MT LOZG: 1.0000 | LOZENGE | OROMUCOSAL | Status: DC | PRN

## 2019-10-05 MED ORDER — METOCLOPRAMIDE HCL 5 MG/ML IJ SOLN: 5.0000 mg | Freq: Three times a day (TID) | INTRAMUSCULAR | Status: DC | PRN

## 2019-10-05 MED ORDER — CHLORHEXIDINE GLUCONATE 4 % EX LIQD: 60.0000 mL | Freq: Once | CUTANEOUS | Status: DC

## 2019-10-05 MED ORDER — ONDANSETRON HCL 4 MG/2ML IJ SOLN: 4.0000 mg | Freq: Once | INTRAMUSCULAR | Status: DC | PRN

## 2019-10-05 MED ORDER — 0.9 % SODIUM CHLORIDE (POUR BTL) OPTIME
TOPICAL | Status: DC | PRN
  Administered 2019-10-05: 6000 mL

## 2019-10-05 MED ORDER — METHOCARBAMOL 500 MG PO TABS: 500.0000 mg | ORAL_TABLET | Freq: Four times a day (QID) | ORAL | Status: DC | PRN

## 2019-10-05 MED ORDER — SODIUM CHLORIDE 0.9 % IV SOLN
INTRAVENOUS | Status: DC | PRN
  Administered 2019-10-05: 50 ug/min via INTRAVENOUS
  Administered 2019-10-05: 10:00:00 via INTRAVENOUS

## 2019-10-05 MED ORDER — PHENOL 1.4 % MT LIQD: 1.0000 | OROMUCOSAL | Status: DC | PRN

## 2019-10-05 MED ORDER — INFLUENZA VAC SPLIT QUAD 0.5 ML IM SUSY
0.5000 mL | PREFILLED_SYRINGE | INTRAMUSCULAR | Status: AC
  Administered 2019-10-06: 12:00:00 0.5 mL via INTRAMUSCULAR
  Filled 2019-10-05: qty 0.5

## 2019-10-05 MED ORDER — BUPIVACAINE HCL (PF) 0.5 % IJ SOLN
INTRAMUSCULAR | Status: DC | PRN
  Administered 2019-10-05: 10 mL via PERINEURAL

## 2019-10-05 MED ORDER — METHOCARBAMOL 1000 MG/10ML IJ SOLN
500.0000 mg | Freq: Four times a day (QID) | INTRAVENOUS | Status: DC | PRN
  Filled 2019-10-05: qty 5

## 2019-10-05 MED ORDER — ASPIRIN 81 MG PO CHEW
81.0000 mg | CHEWABLE_TABLET | Freq: Every day | ORAL | Status: DC
  Administered 2019-10-05 – 2019-10-06 (×2): 81 mg via ORAL
  Filled 2019-10-05 (×2): qty 1

## 2019-10-05 MED ORDER — DEXAMETHASONE SODIUM PHOSPHATE 10 MG/ML IJ SOLN
INTRAMUSCULAR | Status: AC
  Filled 2019-10-05: qty 1

## 2019-10-05 MED ORDER — INSULIN ASPART 100 UNIT/ML ~~LOC~~ SOLN
SUBCUTANEOUS | Status: AC
  Filled 2019-10-05: qty 1

## 2019-10-05 MED ORDER — ROCURONIUM BROMIDE 10 MG/ML (PF) SYRINGE
PREFILLED_SYRINGE | INTRAVENOUS | Status: DC | PRN
  Administered 2019-10-05: 50 mg via INTRAVENOUS

## 2019-10-05 MED ORDER — SUGAMMADEX SODIUM 200 MG/2ML IV SOLN
INTRAVENOUS | Status: DC | PRN
  Administered 2019-10-05: 180 mg via INTRAVENOUS

## 2019-10-05 MED ORDER — LIDOCAINE HCL (CARDIAC) PF 100 MG/5ML IV SOSY
PREFILLED_SYRINGE | INTRAVENOUS | Status: DC | PRN
  Administered 2019-10-05: 80 mg via INTRAVENOUS

## 2019-10-05 MED ORDER — EPINEPHRINE PF 1 MG/ML IJ SOLN
INTRAMUSCULAR | Status: AC
  Filled 2019-10-05: qty 1

## 2019-10-05 MED ORDER — FENTANYL CITRATE (PF) 250 MCG/5ML IJ SOLN
INTRAMUSCULAR | Status: AC
  Filled 2019-10-05: qty 5

## 2019-10-05 MED ORDER — LACTATED RINGERS IV SOLN: INTRAVENOUS | Status: DC

## 2019-10-05 MED ORDER — INSULIN ASPART 100 UNIT/ML ~~LOC~~ SOLN
4.0000 [IU] | Freq: Once | SUBCUTANEOUS | Status: AC
  Administered 2019-10-05: 4 [IU] via SUBCUTANEOUS

## 2019-10-05 MED ORDER — CEFAZOLIN SODIUM-DEXTROSE 2-4 GM/100ML-% IV SOLN
2.0000 g | INTRAVENOUS | Status: AC
  Administered 2019-10-05 (×2): 2 g via INTRAVENOUS
  Filled 2019-10-05: qty 100

## 2019-10-05 MED ORDER — PHENYLEPHRINE 40 MCG/ML (10ML) SYRINGE FOR IV PUSH (FOR BLOOD PRESSURE SUPPORT)
PREFILLED_SYRINGE | INTRAVENOUS | Status: AC
  Filled 2019-10-05: qty 10

## 2019-10-05 MED ORDER — FENTANYL CITRATE (PF) 100 MCG/2ML IJ SOLN: 25.0000 ug | INTRAMUSCULAR | Status: DC | PRN

## 2019-10-05 MED ORDER — HYDROMORPHONE HCL 1 MG/ML IJ SOLN: 0.5000 mg | INTRAMUSCULAR | Status: DC | PRN

## 2019-10-05 MED ORDER — EPINEPHRINE PF 1 MG/ML IJ SOLN
INTRAMUSCULAR | Status: DC | PRN
  Administered 2019-10-05: .1 mL

## 2019-10-05 MED ORDER — BUPIVACAINE LIPOSOME 1.3 % IJ SUSP
INTRAMUSCULAR | Status: DC | PRN
  Administered 2019-10-05: 10 mL via PERINEURAL

## 2019-10-05 MED ORDER — EPHEDRINE 5 MG/ML INJ
INTRAVENOUS | Status: AC
  Filled 2019-10-05: qty 10

## 2019-10-05 MED ORDER — SODIUM CHLORIDE (PF) 0.9 % IJ SOLN
INTRAMUSCULAR | Status: DC | PRN
  Administered 2019-10-05: 30 mL

## 2019-10-05 MED ORDER — CEFAZOLIN SODIUM 1 G IJ SOLR
INTRAMUSCULAR | Status: AC
  Filled 2019-10-05: qty 20

## 2019-10-05 MED ORDER — PHENYLEPHRINE HCL (PRESSORS) 10 MG/ML IV SOLN
INTRAVENOUS | Status: DC | PRN
  Administered 2019-10-05 (×2): 160 ug via INTRAVENOUS

## 2019-10-05 MED ORDER — LIDOCAINE 2% (20 MG/ML) 5 ML SYRINGE
INTRAMUSCULAR | Status: AC
  Filled 2019-10-05: qty 5

## 2019-10-05 MED ORDER — PROPOFOL 10 MG/ML IV BOLUS
INTRAVENOUS | Status: DC | PRN
  Administered 2019-10-05: 170 mg via INTRAVENOUS
  Administered 2019-10-05: 20 mg via INTRAVENOUS
  Administered 2019-10-05: 30 mg via INTRAVENOUS

## 2019-10-05 MED ORDER — LACTATED RINGERS IV SOLN
INTRAVENOUS | Status: DC
  Administered 2019-10-05 (×2): via INTRAVENOUS

## 2019-10-05 MED ORDER — ROCURONIUM BROMIDE 10 MG/ML (PF) SYRINGE
PREFILLED_SYRINGE | INTRAVENOUS | Status: AC
  Filled 2019-10-05: qty 10

## 2019-10-05 MED ORDER — DOCUSATE SODIUM 100 MG PO CAPS
100.0000 mg | ORAL_CAPSULE | Freq: Two times a day (BID) | ORAL | Status: DC
  Administered 2019-10-05 – 2019-10-06 (×3): 100 mg via ORAL
  Filled 2019-10-05 (×3): qty 1

## 2019-10-05 MED ORDER — EPHEDRINE SULFATE-NACL 50-0.9 MG/10ML-% IV SOSY
PREFILLED_SYRINGE | INTRAVENOUS | Status: DC | PRN
  Administered 2019-10-05: 10 mg via INTRAVENOUS
  Administered 2019-10-05 (×5): 5 mg via INTRAVENOUS

## 2019-10-05 MED ORDER — PROPOFOL 10 MG/ML IV BOLUS
INTRAVENOUS | Status: AC
  Filled 2019-10-05: qty 40

## 2019-10-05 MED ORDER — ONDANSETRON HCL 4 MG/2ML IJ SOLN: 4.0000 mg | Freq: Four times a day (QID) | INTRAMUSCULAR | Status: DC | PRN

## 2019-10-05 MED ORDER — VANCOMYCIN HCL 1000 MG IV SOLR
INTRAVENOUS | Status: AC
  Filled 2019-10-05: qty 1000

## 2019-10-05 MED ORDER — MIDAZOLAM HCL 2 MG/2ML IJ SOLN
INTRAMUSCULAR | Status: AC
  Filled 2019-10-05: qty 2

## 2019-10-05 MED ORDER — OXYCODONE HCL 5 MG PO TABS
5.0000 mg | ORAL_TABLET | ORAL | Status: DC | PRN
  Administered 2019-10-06: 10 mg via ORAL
  Filled 2019-10-05: qty 2

## 2019-10-05 MED ORDER — DEXAMETHASONE SODIUM PHOSPHATE 10 MG/ML IJ SOLN
INTRAMUSCULAR | Status: DC | PRN
  Administered 2019-10-05: 6 mg via INTRAVENOUS

## 2019-10-05 MED ORDER — ONDANSETRON HCL 4 MG/2ML IJ SOLN
INTRAMUSCULAR | Status: AC
  Filled 2019-10-05: qty 2

## 2019-10-05 MED ORDER — CEFAZOLIN SODIUM-DEXTROSE 2-4 GM/100ML-% IV SOLN
2.0000 g | Freq: Four times a day (QID) | INTRAVENOUS | Status: AC
  Administered 2019-10-05 (×2): 2 g via INTRAVENOUS
  Filled 2019-10-05 (×2): qty 100

## 2019-10-05 MED ORDER — TRAMADOL HCL 50 MG PO TABS
50.0000 mg | ORAL_TABLET | Freq: Four times a day (QID) | ORAL | Status: DC
  Administered 2019-10-05 – 2019-10-06 (×4): 50 mg via ORAL
  Filled 2019-10-05 (×4): qty 1

## 2019-10-05 SURGICAL SUPPLY — 80 items
ALCOHOL 70% 16 OZ (MISCELLANEOUS) ×3 IMPLANT
BLADE SAW SGTL 13X75X1.27 (BLADE) ×3 IMPLANT
CEMENT BONE R 1X40 (Cement) ×2 IMPLANT
CHLORAPREP W/TINT 26 (MISCELLANEOUS) ×6 IMPLANT
CLOSURE WOUND 1/2 X4 (GAUZE/BANDAGES/DRESSINGS) ×1
COVER SURGICAL LIGHT HANDLE (MISCELLANEOUS) ×3 IMPLANT
COVER WAND RF STERILE (DRAPES) ×3 IMPLANT
DRAPE INCISE IOBAN 66X45 STRL (DRAPES) ×3 IMPLANT
DRAPE U-SHAPE 47X51 STRL (DRAPES) ×6 IMPLANT
DRSG AQUACEL AG ADV 3.5X10 (GAUZE/BANDAGES/DRESSINGS) ×3 IMPLANT
ELECT BLADE 4.0 EZ CLEAN MEGAD (MISCELLANEOUS)
ELECT REM PT RETURN 9FT ADLT (ELECTROSURGICAL) ×3
ELECTRODE BLDE 4.0 EZ CLN MEGD (MISCELLANEOUS) IMPLANT
ELECTRODE REM PT RTRN 9FT ADLT (ELECTROSURGICAL) ×1 IMPLANT
GAUZE SPONGE 4X4 12PLY STRL LF (GAUZE/BANDAGES/DRESSINGS) ×3 IMPLANT
GLENOID MOD AUG 4 PEG LT SZ 4 (Shoulder) ×2 IMPLANT
GLENOID MOD POST TM SZ2 (Post) ×2 IMPLANT
GLOVE BIOGEL PI IND STRL 7.5 (GLOVE) ×1 IMPLANT
GLOVE BIOGEL PI IND STRL 8 (GLOVE) ×1 IMPLANT
GLOVE BIOGEL PI INDICATOR 7.5 (GLOVE) ×2
GLOVE BIOGEL PI INDICATOR 8 (GLOVE) ×2
GLOVE ECLIPSE 7.0 STRL STRAW (GLOVE) ×3 IMPLANT
GLOVE SURG ORTHO 8.0 STRL STRW (GLOVE) ×3 IMPLANT
GOWN STRL REUS W/ TWL LRG LVL3 (GOWN DISPOSABLE) ×2 IMPLANT
GOWN STRL REUS W/ TWL XL LVL3 (GOWN DISPOSABLE) ×1 IMPLANT
GOWN STRL REUS W/TWL LRG LVL3 (GOWN DISPOSABLE) ×4
GOWN STRL REUS W/TWL XL LVL3 (GOWN DISPOSABLE) ×2
GUIDE MODEL REV SHLD LT SZ2 (ORTHOPEDIC DISPOSABLE SUPPLIES) ×2 IMPLANT
HEAD HUMERAL BIPOLAR 52X24X50 (Miscellaneous) IMPLANT
HEAD HUMERAL COMP STD (Orthopedic Implant) IMPLANT
HUMERAL HEAD BIPOLAR 52X24X50 (Miscellaneous) ×3 IMPLANT
HUMERAL HEAD COMP STD (Orthopedic Implant) ×3 IMPLANT
HYDROGEN PEROXIDE 16OZ (MISCELLANEOUS) ×3 IMPLANT
JET LAVAGE IRRISEPT WOUND (IRRIGATION / IRRIGATOR) ×3
KIT BASIN OR (CUSTOM PROCEDURE TRAY) ×3 IMPLANT
KIT TURNOVER KIT B (KITS) ×3 IMPLANT
LAVAGE JET IRRISEPT WOUND (IRRIGATION / IRRIGATOR) ×1 IMPLANT
LOOP VESSEL MAXI BLUE (MISCELLANEOUS) ×3 IMPLANT
MANIFOLD NEPTUNE II (INSTRUMENTS) ×3 IMPLANT
NDL HYPO 25GX1X1/2 BEV (NEEDLE) IMPLANT
NDL SUT 6 .5 CRC .975X.05 MAYO (NEEDLE) ×1 IMPLANT
NEEDLE HYPO 25GX1X1/2 BEV (NEEDLE) IMPLANT
NEEDLE MAYO TAPER (NEEDLE) ×2
NS IRRIG 1000ML POUR BTL (IV SOLUTION) ×3 IMPLANT
PACK SHOULDER (CUSTOM PROCEDURE TRAY) ×3 IMPLANT
PAD ARMBOARD 7.5X6 YLW CONV (MISCELLANEOUS) ×6 IMPLANT
PIN HUMERAL STMN 3.2MMX9IN (INSTRUMENTS) ×2 IMPLANT
PIN STEINMANN THREADED TIP (PIN) ×2 IMPLANT
PIN THREADED REVERSE (PIN) ×2 IMPLANT
REAMER GUIDE AUG PEG 4 LT (INSTRUMENTS) ×2 IMPLANT
RESTRAINT HEAD UNIVERSAL NS (MISCELLANEOUS) ×3 IMPLANT
RETRIEVER SUT HEWSON (MISCELLANEOUS) ×3 IMPLANT
SLING ARM IMMOBILIZER LRG (SOFTGOODS) ×3 IMPLANT
SOL PREP POV-IOD 4OZ 10% (MISCELLANEOUS) ×3 IMPLANT
SPONGE LAP 18X18 RF (DISPOSABLE) ×3 IMPLANT
STEM HUMERAL STRL 11MMX83MM (Stem) ×2 IMPLANT
STRIP CLOSURE SKIN 1/2X4 (GAUZE/BANDAGES/DRESSINGS) ×2 IMPLANT
SUCTION FRAZIER HANDLE 10FR (MISCELLANEOUS) ×2
SUCTION TUBE FRAZIER 10FR DISP (MISCELLANEOUS) ×1 IMPLANT
SUT BROADBAND TAPE 2PK 1.5 (SUTURE) ×6 IMPLANT
SUT FIBERWIRE #2 38 T-5 BLUE (SUTURE)
SUT MAXBRAID (SUTURE) IMPLANT
SUT MNCRL AB 3-0 PS2 18 (SUTURE) ×3 IMPLANT
SUT SILK 2 0 TIES 10X30 (SUTURE) ×3 IMPLANT
SUT VIC AB 0 CT1 27 (SUTURE) ×4
SUT VIC AB 0 CT1 27XBRD ANBCTR (SUTURE) ×2 IMPLANT
SUT VIC AB 1 CT1 27 (SUTURE) ×2
SUT VIC AB 1 CT1 27XBRD ANBCTR (SUTURE) ×1 IMPLANT
SUT VIC AB 2-0 CT1 27 (SUTURE) ×4
SUT VIC AB 2-0 CT1 TAPERPNT 27 (SUTURE) ×2 IMPLANT
SUT VICRYL 0 UR6 27IN ABS (SUTURE) ×6 IMPLANT
SUTURE FIBERWR #2 38 T-5 BLUE (SUTURE) IMPLANT
SUTURE TAPE 1.3 40 TPR END (SUTURE) IMPLANT
SUTURETAPE 1.3 40 TPR END (SUTURE)
SYR CONTROL 10ML LL (SYRINGE) IMPLANT
TOWEL GREEN STERILE (TOWEL DISPOSABLE) ×3 IMPLANT
TOWEL GREEN STERILE FF (TOWEL DISPOSABLE) ×3 IMPLANT
TRAY FOL W/BAG SLVR 16FR STRL (SET/KITS/TRAYS/PACK) IMPLANT
TRAY FOLEY W/BAG SLVR 16FR LF (SET/KITS/TRAYS/PACK)
WATER STERILE IRR 1000ML POUR (IV SOLUTION) ×3 IMPLANT

## 2019-10-05 NOTE — Anesthesia Procedure Notes (Signed)
Anesthesia Regional Block: Interscalene brachial plexus block   Pre-Anesthetic Checklist: ,, timeout performed, Correct Patient, Correct Site, Correct Laterality, Correct Procedure, Correct Position, site marked, Risks and benefits discussed,  Surgical consent,  Pre-op evaluation,  At surgeon's request and post-op pain management  Laterality: Left  Prep: chloraprep       Needles:  Injection technique: Single-shot  Needle Type: Echogenic Stimulator Needle     Needle Length: 5cm  Needle Gauge: 22     Additional Needles:   Procedures:,,,, ultrasound used (permanent image in chart),,,,  Narrative:  Start time: 10/05/2019 6:46 AM End time: 10/05/2019 6:56 AM Injection made incrementally with aspirations every 5 mL.  Performed by: Personally  Anesthesiologist: [name], MD  Additional Notes: Functioning IV was confirmed and monitors were applied.  A 35mm 22ga Arrow echogenic stimulator needle was used. Sterile prep and drape, hand hygiene, and sterile gloves were used.  Negative aspiration and negative test dose prior to incremental administration of local anesthetic. The patient tolerated the procedure well.  Ultrasound guidance: relevent anatomy identified, needle position confirmed, local anesthetic spread visualized around nerve(s), vascular puncture avoided.  Image printed for medical record.

## 2019-10-05 NOTE — Op Note (Signed)
NAME: William Mcfarland, William Mcfarland MEDICAL RECORD [id number] ACCOUNT [id number] DATE OF BIRTH:24-Jan-1956 FACILITY: [facility] LOCATION: [facility] PHYSICIAN:GREGORY Randel Pigg, MD  OPERATIVE REPORT  DATE OF PROCEDURE:  10/05/2019  PREOPERATIVE DIAGNOSIS:  Left shoulder arthritis.  POSTOPERATIVE DIAGNOSIS:  Left shoulder arthritis.  PROCEDURE:  Left total shoulder replacement using [employer] modular augmented glenoid 4 PEG size 4 with trabecular metal post and size 11 humeral stem with modular head, 50 mm x 24 mm x 52 curvature and 5 broad band tapes for subscap repair.  SURGEON:  Meredith Pel, MD  ASSISTANT:  Annie Main, PA  INDICATIONS:  This is a 63 year old patient with end-stage left shoulder arthritis who presents for operative management after explanation of risks and benefits.  PROCEDURE IN DETAIL:  The patient was brought to the operating room where general anesthetic was induced.  Preoperative antibiotics administered.  Timeout was called.  The patient was placed in the beach chair position with his head in neutral position.   Left arm pre-scrubbed with hydrogen peroxide followed by alcohol and Betadine, which was allowed to air dry, then prepped with ChloraPrep solution and draped in a sterile manner.  Ioban used to cover the entire operative field.  Timeout was called.   Deltopectoral approach was made.  Skin and subcutaneous tissue were sharply divided.  Cephalic vein mobilized medially.  The subdeltoid adhesions were released.  Rotator cuff was robust and intact.  Biceps tendon was identified and tenodesed using 0  Vicryl sutures to the pec tendon.  Rotator interval was opened up to the base of the coracoid.  Circumflex vessels were ligated.  Axillary nerve was visualized and a vessel loop placed around it and it was protected at all times during the case.  At this  time, the coracohumeral ligament was released.  The deltoid was also elevated manually off its anterior attachment.   At this time, subscapularis was peeled off the lesser tuberosity using sharp dissection.  Tagged with 0 Vicryl sutures and the release  was performed around to the 7 o'clock position on the shoulder.  Capsule was also released about 1.5 cm off the inferior humeral neck.  Osteophytes were removed.  At this time intramedullary alignment guide was placed and was reamed up to size 11.  The  head was then cut at its anatomic neck and 30 degrees of retroversion.  Cap was placed after placing a 9 broach.  Attention was then directed towards the glenoid side.  Significant glenoid wear was present.  A 360-degree subscapularis release was  performed with care being taken to avoid injury to the axillary nerve.  The capsule was then removed around to the 5 o'clock position on the humerus.  At this time, the patient's specific instrumentation guide was used to enter into the glenoid vault.   Pins were drilled and reaming was performed.  The patient did have a type B2 deformity.  This was going to be corrected with an augmented glenoid.  Following the appropriate reaming steps and drilling for the central peg, a trial glenoid was placed and  had good contact circumferentially.  At this time, cement was mixed and the true component was placed into position with good press-fit obtained with the press-fit post and the cemented pegs.  The cement was allowed to harden.  Attention then directed  towards the humerus.  A trial head reduction was performed with the 50 x 24 and this gave excellent stability 50%, inferior and posterior translation without dislocation and appropriate head height.  The trial component was removed.  True components  placed with same stability parameters maintained.  Five suture tapes were placed prior to placing the humeral stem.  Same stability parameters were maintained.  The patient could go over head and had internal rotation for the forearm parallel to the  torso.  Thorough irrigation was  performed with Aricept during the entire case.  Irrigation again performed with about 3 liters of irrigating solution into the shoulder.  Vancomycin powder placed.  The subscapularis was then repaired and 45 degrees of  external rotation using the 5 tapes.  These were tied in a Nice knot fashion.  Next, the rotator interval was closed using #1 Vicryl suture with the arm in 45 degrees of external rotation.  Thorough irrigation again performed.  Axillary nerve was  palpated and intact.  A vessel loop was removed.  Vancomycin powder placed and the deltopectoral interval was closed using #1 Vicryl suture followed by interrupted inverted 0 Vicryl suture, 2-0 Vicryl suture and a 3-0 Monocryl.  Steri-Strips and Aquacel  dressing applied with a shoulder immobilizer.  Luke's assistance was required at all times during the case for retraction mobilization opening and closing.  His assistance was of medical necessity.  TN/NUANCE  D:10/05/2019 T:10/05/2019 JOB:[id number]

## 2019-10-05 NOTE — Anesthesia Procedure Notes (Signed)
Procedure Name: Intubation Date/Time: 10/05/2019 7:24 AM Performed by: Raenette Rover, CRNA Pre-anesthesia Checklist: Patient identified, Emergency Drugs available, Suction available and Patient being monitored Patient Re-evaluated:Patient Re-evaluated prior to induction Oxygen Delivery Method: Circle system utilized Preoxygenation: Pre-oxygenation with 100% oxygen Induction Type: IV induction Ventilation: Mask ventilation without difficulty and Oral airway inserted - appropriate to patient size Laryngoscope Size: Sabra Heck and 3 Grade View: Grade I Tube type: Oral Tube size: 7.5 mm Number of attempts: 1 Airway Equipment and Method: Stylet Placement Confirmation: positive ETCO2,  ETT inserted through vocal cords under direct vision and breath sounds checked- equal and bilateral Secured at: 23 cm Tube secured with: Tape Dental Injury: Teeth and Oropharynx as per pre-operative assessment

## 2019-10-05 NOTE — Brief Op Note (Signed)
10/05/2019   10/05/2019  11:54 AM  PATIENT:  William Mcfarland  63 y.o. male  PRE-OPERATIVE DIAGNOSIS:  left shoulder osteoarthritis  POST-OPERATIVE DIAGNOSIS:  left shoulder osteoarthritis  PROCEDURE:  Procedure(s): LEFT Total shoulder arthroplasty  SURGEON:  Surgeon(s): Meredith Pel, MD  ASSISTANT: magnant pa  ANESTHESIA:   general  EBL: 150 ml    Total I/O In: 1700 [I.V.:1700] Out: 150 [Blood:150]  BLOOD ADMINISTERED: none  DRAINS: none   LOCAL MEDICATIONS USED:  vanco  SPECIMEN:  No Specimen  COUNTS:  YES  TOURNIQUET:  * No tourniquets in log *  DICTATION: .Other Dictation: Dictation Number [id number]  PLAN OF CARE: Admit for overnight observation  PATIENT DISPOSITION:  PACU - hemodynamically stable

## 2019-10-05 NOTE — Transfer of Care (Signed)
Immediate Anesthesia Transfer of Care Note  Patient: William Mcfarland  Procedure(s) Performed: LEFT Total shoulder arthroplasty (Left Shoulder)  Patient Location: PACU  Anesthesia Type:GA combined with regional for post-op pain  Level of Consciousness: awake, alert , oriented and patient cooperative  Airway & Oxygen Therapy: Patient Spontanous Breathing and Patient connected to nasal cannula oxygen  Post-op Assessment: Report given to RN and Post -op Vital signs reviewed and stable  Post vital signs: Reviewed and stable  Last Vitals:  Vitals Value Taken Time  BP 123/69 10/05/19 1214  Temp    Pulse 107 10/05/19 1216  Resp 23 10/05/19 1216  SpO2 97 % 10/05/19 1216  Vitals shown include unvalidated device data.  Last Pain:  Vitals:   10/05/19 0612  TempSrc: Oral  PainSc:       Patients Stated Pain Goal: 5 ([id number])  Complications: No apparent anesthesia complications

## 2019-10-05 NOTE — Anesthesia Postprocedure Evaluation (Signed)
Anesthesia Post Note  Patient: William Mcfarland  Procedure(s) Performed: LEFT Total shoulder arthroplasty (Left Shoulder)     Patient location during evaluation: PACU Anesthesia Type: General Level of consciousness: awake and alert Pain management: pain level controlled Vital Signs Assessment: post-procedure vital signs reviewed and stable Respiratory status: spontaneous breathing, nonlabored ventilation, respiratory function stable and patient connected to nasal cannula oxygen Cardiovascular status: blood pressure returned to baseline and stable Postop Assessment: no apparent nausea or vomiting Anesthetic complications: no    Last Vitals:  Vitals:   10/05/19 1300 10/05/19 1315  BP: 110/71 121/88  Pulse: 99 (!) 105  Resp: 19 (!) 21  Temp:  (!) 36.1 C  SpO2: 95% 94%    Last Pain:  Vitals:   10/05/19 0612  TempSrc: Oral  PainSc:                  [name]

## 2019-10-05 NOTE — H&P (Signed)
William Mcfarland is an 63 y.o. male.   Chief Complaint: Left shoulder pain HPI: William Mcfarland is a 63 year old patient with left shoulder pain.  Has a long history of end-stage glenohumeral arthritis.  CT scan demonstrates intact rotator cuff but significant posterior type B2 glenoid.  Preoperative patient planning demonstrates correction of the deformity with augmented baseplate.  Patient presents now for operative management after explanation of risks and benefits.  Patient is right-hand dominant.  Denies any other orthopedic complaints.  He has done very well with his right shoulder replacement.  Past Medical History:  Diagnosis Date  . Arthritis    shoulders, knees, hips  . At risk for sleep apnea    STOP-BANG= 5     SENT TO PCP 04-12-2015  . History of colon polyps   . History of radiation therapy 45 Gy plus seed implants on 04-13-2015   prostate external beam 02-20-2015 to 03-24-2015  . Hyperlipidemia   . Hypertension   . Lower urinary tract symptoms (LUTS)   . Prostate cancer [facility]) urologist-  dr ottelin/  oncologist- dr Tammi Klippel   T1c, Gleason 4+3,  PSA 6.83,  vol. 32.56cc--  s/p  external beam radiation 02-20-2015 to 03-24-2015  . Type 2 diabetes mellitus ([place])   . Type 2 diabetes, diet controlled ([place])   . Wears glasses     Past Surgical History:  Procedure Laterality Date  . COLONOSCOPY W/ POLYPECTOMY  2013  . EYE SURGERY     Cornea replacement left eye (2018)  . ORIF LEFT LEG FX  1988  . ORIF RIGHT ANKLE FX  1985  . PROSTATE BIOPSY    . RADIOACTIVE SEED IMPLANT N/A 04/14/2015   Procedure: RADIOACTIVE SEED IMPLANT;  Surgeon: Kathie Rhodes, MD;  Location: [facility];  Service: Urology;  Laterality: N/A;   64     seeds implanted no seeds found in bladder  . TOTAL SHOULDER ARTHROPLASTY Right 05/21/2016   Procedure: TOTAL SHOULDER ARTHROPLASTY;  Surgeon: Meredith Pel, MD;  Location: [place];  Service: Orthopedics;  Laterality: Right;    Family History   Problem Relation Age of Onset  . Stroke Father   . Stroke Mother   . Cancer Neg Hx    Social History:  reports that he quit smoking about 20 years ago. His smoking use included cigarettes. He has a 7.50 pack-year smoking history. He has never used smokeless tobacco. He reports current alcohol use. He reports that he does not use drugs.  Allergies: No Known Allergies  Medications Prior to Admission  Medication Sig Dispense Refill  . atorvastatin (LIPITOR) 10 MG tablet Take 10 mg by mouth daily in the afternoon.     . bictegravir-emtricitabine-tenofovir AF (BIKTARVY) 50-200-25 MG TABS tablet Take 1 tablet by mouth daily. (Patient taking differently: Take 1 tablet by mouth daily in the afternoon. ) 30 tablet 5  . escitalopram (LEXAPRO) 10 MG tablet Take 10 mg by mouth 4 (four) times a week.     Marland Kitchen ibuprofen (ADVIL) 200 MG tablet Take 400 mg by mouth every 8 (eight) hours as needed (pain.).    Marland Kitchen lisinopril-hydrochlorothiazide (ZESTORETIC) 20-12.5 MG tablet Take 1 tablet by mouth 2 (two) times a day.    . metFORMIN (GLUCOPHAGE) 500 MG tablet Take 500 mg by mouth 2 (two) times daily with a meal.    . naproxen sodium (ALEVE) 220 MG tablet Take 440 mg by mouth 2 (two) times daily as needed (pain.).    Marland Kitchen traMADol (ULTRAM) 50  MG tablet 1 po q 12hrs prn pain (Patient not taking: Reported on 09/22/2019) 40 tablet 0    Results for orders placed or performed during the hospital encounter of 10/05/19 (from the past 48 hour(s))  Glucose, capillary     Status: Abnormal   Collection Time: 10/05/19  6:14 AM  Result Value Ref Range   Glucose-Capillary 137 (H) 70 - 99 mg/dL   No results found.  Review of Systems  Musculoskeletal: Positive for joint pain.  All other systems reviewed and are negative.   Blood pressure (!) 146/93, pulse 73, temperature 98.6 F (37 C), temperature source Oral, resp. rate 20, height 5\' 6"  (1.676 m), weight 91.2 kg. Physical Exam  Constitutional: He appears well-developed.   HENT:  Head: Normocephalic.  Eyes: Pupils are equal, round, and reactive to light.  Neck: Normal range of motion.  Respiratory: Effort normal.  Neurological: He is alert.  Skin: Skin is warm.  Psychiatric: He has a normal mood and affect.  Examination of the left shoulder demonstrates diminished range of motion but good rotator cuff strength infraspinatus supraspinatus and subscap muscle testing.  Some coarse grinding and is present.  Tattoos noted on the deltoid.  Motor sensory function of the hand is intact deltoid is functional  Assessment/Plan Impression is end-stage left shoulder arthritis with about 26 degrees of retroversion of the glenoid.  Rotator cuff does appear to be functional and intact.  Plan at this time is total shoulder replacement with augmented glenoid.  Risk and benefits are discussed including not limited to infection nerve vessel damage dislocation as well as potential need for more surgery.  Patient understands risk benefits.  Will use CPM machine after surgery.  Protect that subscap repair first for 6 weeks.  All questions answered.  Anderson Malta, MD 10/05/2019, 7:12 AM

## 2019-10-05 NOTE — Progress Notes (Signed)
Pt. Refused text message updates for his sister.

## 2019-10-06 ENCOUNTER — Encounter (HOSPITAL_COMMUNITY): Payer: Self-pay | Admitting: Orthopedic Surgery

## 2019-10-06 MED ORDER — ASPIRIN 81 MG PO CHEW: 81.0000 mg | CHEWABLE_TABLET | Freq: Every day | ORAL | 0 refills | Status: DC

## 2019-10-06 MED ORDER — OXYCODONE HCL 5 MG PO TABS: 5.0000 mg | ORAL_TABLET | ORAL | 0 refills | Status: DC | PRN

## 2019-10-06 MED ORDER — METHOCARBAMOL 500 MG PO TABS: 500.0000 mg | ORAL_TABLET | Freq: Three times a day (TID) | ORAL | 0 refills | Status: DC | PRN

## 2019-10-06 NOTE — Progress Notes (Signed)
PT Cancellation Note  Patient Details Name: William Mcfarland MRN: [id number] DOB: 04-09-1956   Cancelled Treatment:    Reason Eval/Treat Not Completed: PT screened, no needs identified, will sign off. Pt evaluated by OT with no acute PT needs identified at this time.    Clearnce Sorrel Ignacio Lowder 10/06/2019, 11:09 AM

## 2019-10-06 NOTE — Evaluation (Signed)
Occupational Therapy Evaluation and Discharge Patient Details Name: William Mcfarland MRN: [id number] DOB: 1956-06-17 Today's Date: 10/06/2019    History of Present Illness s/p L TSA PMH: R TSA 2017, prostate cancer, DM   Clinical Impression   All education completed as detailed below with pt demonstrating and/or verbalizing understanding. Reinforced with written handouts.     Follow Up Recommendations  Follow surgeon's recommendation for DC plan and follow-up therapies    Equipment Recommendations  None recommended by OT    Recommendations for Other Services       Precautions / Restrictions Precautions Precautions: Shoulder Type of Shoulder Precautions: pendulums, AROM elbow to hand Shoulder Interventions: Shoulder sling/immobilizer;Off for dressing/bathing/exercises Precaution Booklet Issued: Yes (comment) Required Braces or Orthoses: Sling Restrictions Weight Bearing Restrictions: Yes LUE Weight Bearing: Non weight bearing      Mobility Bed Mobility Overal bed mobility: Modified Independent                Transfers Overall transfer level: Independent Equipment used: None                  Balance                                           ADL either performed or assessed with clinical judgement   ADL Overall ADL's : Modified independent                                       General ADL Comments: Instructed in compensatory strategies for ADL, positioning L shoulder in bed and chair, sling use and NWB precaution.     Vision Baseline Vision/History: Wears glasses Wears Glasses: Reading only Patient Visual Report: No change from baseline       Perception     Praxis      Pertinent Vitals/Pain Pain Assessment: Faces Faces Pain Scale: Hurts a little bit Pain Location: L shoulder Pain Descriptors / Indicators: Sore Pain Intervention(s): Repositioned;Premedicated before session;Ice applied     Hand  Dominance Right   Extremity/Trunk Assessment Upper Extremity Assessment Upper Extremity Assessment: LUE deficits/detail LUE Deficits / Details: performed pendulums and AROM elbow to hand x 10 LUE Coordination: decreased gross motor   Lower Extremity Assessment Lower Extremity Assessment: Overall WFL for tasks assessed   Cervical / Trunk Assessment Cervical / Trunk Assessment: Normal   Communication Communication Communication: No difficulties   Cognition Arousal/Alertness: Awake/alert Behavior During Therapy: WFL for tasks assessed/performed Overall Cognitive Status: Within Functional Limits for tasks assessed                                     General Comments       Exercises     Shoulder Instructions      Home Living Family/patient expects to be discharged to:: Private residence Living Arrangements: Alone Available Help at Discharge: Neighbor;Available PRN/intermittently                                    Prior Functioning/Environment Level of Independence: Independent        Comments: works in Equities trader at [employer]  OT Problem List:        OT Treatment/Interventions:      OT Goals(Current goals can be found in the care plan section) Acute Rehab OT Goals Patient Stated Goal: to regain use of L shoulder  OT Frequency:     Barriers to D/C:            Co-evaluation              AM-PAC OT "6 Clicks" Daily Activity     Outcome Measure Help from another person eating meals?: None Help from another person taking care of personal grooming?: None Help from another person toileting, which includes using toliet, bedpan, or urinal?: None Help from another person bathing (including washing, rinsing, drying)?: None Help from another person to put on and taking off regular upper body clothing?: None Help from another person to put on and taking off regular lower body clothing?: None 6 Click Score: 24   End of Session     Activity Tolerance: Patient tolerated treatment well Patient left: in chair;with call bell/phone within reach  OT Visit Diagnosis: Muscle weakness (generalized) (M62.81)                Time: [id number] OT Time Calculation (min): 39 min Charges:  OT General Charges $OT Visit: 1 Visit OT Evaluation $OT Eval Low Complexity: 1 Low OT Treatments $Self Care/Home Management : 8-22 mins $Therapeutic Exercise: 8-22 mins  Nestor Lewandowsky, OTR/L Acute Rehabilitation Services Pager: [phone] Office: [phone]  Malka So 10/06/2019, 9:04 AM

## 2019-10-06 NOTE — Progress Notes (Signed)
Patient stable Block is worn off but pain is okay Left deltoid fires Motor sensory function hand intact Discharge to home with CPM machine arranged already

## 2019-10-06 NOTE — Plan of Care (Signed)
  Problem: Activity: Goal: Ability to avoid complications of mobility impairment will improve Outcome: Adequate for Discharge Goal: Ability to tolerate increased activity will improve Outcome: Adequate for Discharge   Problem: Education: Goal: Verbalization of understanding the information provided will improve Outcome: Adequate for Discharge   Problem: Coping: Goal: Level of anxiety will decrease Outcome: Adequate for Discharge   Problem: Physical Regulation: Goal: Postoperative complications will be avoided or minimized Outcome: Adequate for Discharge   Problem: Respiratory: Goal: Ability to maintain a clear airway will improve Outcome: Adequate for Discharge   Problem: Pain Management: Goal: Pain level will decrease Outcome: Adequate for Discharge   Problem: Skin Integrity: Goal: Signs of wound healing will improve Outcome: Adequate for Discharge   Problem: Tissue Perfusion: Goal: Ability to maintain adequate tissue perfusion will improve Outcome: Adequate for Discharge

## 2019-10-06 NOTE — Progress Notes (Signed)
  Subjective: William Mcfarland is a 63 y.o. male s/p left TSA.  They are POD1.  Pt's pain is controlled. He has ambulated around his room without and difficulty or dizziness.    Objective: Vital signs in last 24 hours: Temp:  [97 F (36.1 C)-98.7 F (37.1 C)] 98.1 F (36.7 C) (10/21 0403) Pulse Rate:  [82-111] 82 (10/21 0820) Resp:  [14-23] 17 (10/21 0820) BP: (96-128)/(61-88) 125/85 (10/21 0820) SpO2:  [94 %-98 %] 98 % (10/21 0820)  Intake/Output from previous day: 10/20 0701 - 10/21 0700 In: 2702 [P.O.:702; I.V.:2000] Out: 1450 [Urine:1300; Blood:150] Intake/Output this shift: No intake/output data recorded.  Exam:  No gross blood or drainage overlying the dressing 2+ radial pulse Sensation intact distally in the left hand Able to extend the left wrist.  Thumb extension, thumb IP flexion, and finger adduction intact   Labs: Recent Labs    10/04/19 0952  HGB 14.7   Recent Labs    10/04/19 0952  WBC 6.5  RBC 4.51  HCT 41.6  PLT 326   Recent Labs    10/04/19 0952  NA 138  K 4.3  CL 100  CO2 31  BUN 15  CREATININE 0.73  GLUCOSE 165*  CALCIUM 9.4   No results for input(s): LABPT, INR in the last 72 hours.  Assessment/Plan: Pt is POD1 s/p left TSA    -Plan to discharge to home today  -No lifting with the operative arm  -Okay to shower, dressing is waterproof.  Cautioned patient against soaking dressing in bath/pool/body of water  -Use the CPM machine at least 3 times per day for one hour each time, increasing the degrees daily.     Earnestine Shipp L Genee Rann 10/06/2019, 12:05 PM

## 2019-10-07 LAB — HIV-1 RNA QUANT-NO REFLEX-BLD
HIV 1 RNA Quant: <20 copies/mL
HIV-1 RNA Quant, Log: <1.3 Log copies/mL

## 2019-10-11 NOTE — Discharge Summary (Signed)
Physician Discharge Summary      Patient ID: William Mcfarland MRN: [id number] DOB/AGE: 63-05-1956 63 y.o.  Admit date: 10/05/2019 Discharge date: 10/06/2019  Admission Diagnoses:  Active Problems:   OA (osteoarthritis) of shoulder   Discharge Diagnoses:  Same  Surgeries: Procedure(s): LEFT Total shoulder arthroplasty on 10/05/2019   Consultants:   Discharged Condition: Stable  Hospital Course: William Mcfarland is an 63 y.o. male who was admitted 10/05/2019 with a chief complaint of left shoulder pain, and found to have a diagnosis of left shoulder osteoarthritis.  They were brought to the operating room on 10/05/2019 and underwent the above named procedures.  Pt awoke from anesthesia without complication and was transferred to the floor. On POD1, patient's pain was controlled and he was able to ambulate normally around his room.  Patient was discharged home on POD1.  Pt will f/u with Dr. Marlou Sa in clinic in ~2 weeks.   Antibiotics given:  Anti-infectives (From admission, onward)   Start     Dose/Rate Route Frequency Ordered Stop   10/05/19 1700  ceFAZolin (ANCEF) IVPB 2g/100 mL premix     2 g 200 mL/hr over 30 Minutes Intravenous Every 6 hours 10/05/19 1434 10/05/19 2133   10/05/19 1214  vancomycin (VANCOCIN) powder  Status:  Discontinued       As needed 10/05/19 1215 10/05/19 1215   10/05/19 0600  ceFAZolin (ANCEF) IVPB 2g/100 mL premix     2 g 200 mL/hr over 30 Minutes Intravenous On call to O.R. 10/05/19 [id number] 10/05/19 1110    .  Recent vital signs:  Vitals:   10/06/19 0403 10/06/19 0820  BP: 128/82 125/85  Pulse: 83 82  Resp: 16 17  Temp: 98.1 F (36.7 C)   SpO2: 97% 98%    Recent laboratory studies:  Results for orders placed or performed during the hospital encounter of 10/05/19  Glucose, capillary  Result Value Ref Range   Glucose-Capillary 137 (H) 70 - 99 mg/dL  Glucose, capillary  Result Value Ref Range   Glucose-Capillary 221 (H) 70 - 99  mg/dL   Comment 1 Document in Chart   Glucose, capillary  Result Value Ref Range   Glucose-Capillary 210 (H) 70 - 99 mg/dL   Comment 1 Notify RN     Discharge Medications:   Allergies as of 10/06/2019   No Known Allergies     Medication List    STOP taking these medications   ibuprofen 200 MG tablet Commonly known as: ADVIL   traMADol 50 MG tablet Commonly known as: ULTRAM     TAKE these medications   aspirin 81 MG chewable tablet Chew 1 tablet (81 mg total) by mouth daily.   atorvastatin 10 MG tablet Commonly known as: LIPITOR Take 10 mg by mouth daily in the afternoon.   bictegravir-emtricitabine-tenofovir AF 50-200-25 MG Tabs tablet Commonly known as: BIKTARVY Take 1 tablet by mouth daily. What changed: when to take this   escitalopram 10 MG tablet Commonly known as: LEXAPRO Take 10 mg by mouth 4 (four) times a week.   lisinopril-hydrochlorothiazide 20-12.5 MG tablet Commonly known as: ZESTORETIC Take 1 tablet by mouth 2 (two) times a day.   metFORMIN 500 MG tablet Commonly known as: GLUCOPHAGE Take 500 mg by mouth 2 (two) times daily with a meal.   methocarbamol 500 MG tablet Commonly known as: ROBAXIN Take 1 tablet (500 mg total) by mouth every 8 (eight) hours as needed for muscle spasms.   naproxen sodium 220 MG tablet  Commonly known as: ALEVE Take 440 mg by mouth 2 (two) times daily as needed (pain.).   oxyCODONE 5 MG immediate release tablet Commonly known as: Oxy IR/ROXICODONE Take 1 tablet (5 mg total) by mouth every 4 (four) hours as needed for moderate pain (pain score 4-6).       Diagnostic Studies: Dg Shoulder Left Port  Result Date: 10/05/2019 CLINICAL DATA:  Status post left total shoulder arthroplasty. EXAM: LEFT SHOULDER - 1 VIEW COMPARISON:  Apr 20, 2018. FINDINGS: The left glenoid and humeral components appear to be well situated. No fracture or dislocation is noted. Visualized ribs are unremarkable. IMPRESSION: Status post left  total shoulder arthroplasty. Electronically Signed   By: Marijo Conception M.D.   On: 10/05/2019 12:59    Disposition:   Discharge Instructions    Call MD / Call 911   Complete by: As directed    If you experience chest pain or shortness of breath, CALL 911 and be transported to the hospital emergency room.  If you develope a fever above 101 F, pus (white drainage) or increased drainage or redness at the wound, or calf pain, call your surgeon's office.   Call MD / Call 911   Complete by: As directed    If you experience chest pain or shortness of breath, CALL 911 and be transported to the hospital emergency room.  If you develope a fever above 101 F, pus (white drainage) or increased drainage or redness at the wound, or calf pain, call your surgeon's office.   Constipation Prevention   Complete by: As directed    Drink plenty of fluids.  Prune juice may be helpful.  You may use a stool softener, such as Colace (over the counter) 100 mg twice a day.  Use MiraLax (over the counter) for constipation as needed.   Constipation Prevention   Complete by: As directed    Drink plenty of fluids.  Prune juice may be helpful.  You may use a stool softener, such as Colace (over the counter) 100 mg twice a day.  Use MiraLax (over the counter) for constipation as needed.   Diet - low sodium heart healthy   Complete by: As directed    Diet - low sodium heart healthy   Complete by: As directed    Discharge instructions   Complete by: As directed    You may shower, dressing is waterproof.  Do not bathe or soak the operative shoulder in a tub, pool.  Use the CPM machine 3 times a day for one hour each time.  No lifting with the operative shoulder. Continue use of the sling.  Follow-up with Dr. Marlou Sa in ~2 weeks on your given appointment date.  We will remove your adhesive bandage at that time.   Discharge instructions   Complete by: As directed    CPM 1 hour 3 times a day No lifting with left arm Okay to  shower dressing is waterproof   Increase activity slowly as tolerated   Complete by: As directed    Increase activity slowly as tolerated   Complete by: As directed          Signed: Donella Stade 10/11/2019, 8:06 PM

## 2019-10-18 ENCOUNTER — Telehealth: Payer: Self-pay | Admitting: Orthopedic Surgery

## 2019-10-18 ENCOUNTER — Other Ambulatory Visit: Payer: Self-pay

## 2019-10-18 ENCOUNTER — Ambulatory Visit: Payer: BC Managed Care – PPO | Admitting: Family

## 2019-10-18 ENCOUNTER — Encounter: Payer: Self-pay | Admitting: Family

## 2019-10-18 VITALS — BP 146/90 | HR 83 | Temp 97.6°F | Wt 208.0 lb

## 2019-10-18 DIAGNOSIS — E1165 Type 2 diabetes mellitus with hyperglycemia: Secondary | ICD-10-CM

## 2019-10-18 DIAGNOSIS — B20 Human immunodeficiency virus [HIV] disease: Secondary | ICD-10-CM | POA: Diagnosis not present

## 2019-10-18 DIAGNOSIS — Z0000 Encounter for general adult medical examination without abnormal findings: Secondary | ICD-10-CM | POA: Diagnosis not present

## 2019-10-18 DIAGNOSIS — I10 Essential (primary) hypertension: Secondary | ICD-10-CM | POA: Diagnosis not present

## 2019-10-18 MED ORDER — BICTEGRAVIR-EMTRICITAB-TENOFOV 50-200-25 MG PO TABS: 1.0000 | ORAL_TABLET | Freq: Every day | ORAL | 5 refills | Status: DC

## 2019-10-18 NOTE — Patient Instructions (Addendum)
Nice to see you.  We will continue your Biktarvy daily as prescribed.  Refills of medication have been sent to the pharmacy.   Plan for follow up in 4 months or sooner if needed with lab work 1-2 weeks prior to appointment.   Have a great holiday season!

## 2019-10-18 NOTE — Assessment & Plan Note (Signed)
William Mcfarland has well-controlled HIV disease and remains undetectable with good adherence and tolerance to his ART regimen of Biktarvy.  He has no signs/symptoms of opportunistic infection or progressive HIV disease.  He has no problems obtaining his medication from the pharmacy.  We discussed and reviewed blood work results as well as plan of care.  Continue current dose of Biktarvy.  Plan for follow-up office visit in 4 months or sooner if needed with lab work 1 to 2 weeks prior to appointment.

## 2019-10-18 NOTE — Telephone Encounter (Signed)
Patient called advised he has gotten up to 93% on the CPM machine. Patient asked if he should keep going.   The number to contact patient is [phone]

## 2019-10-18 NOTE — Telephone Encounter (Signed)
Please advise. Thanks.  

## 2019-10-18 NOTE — Assessment & Plan Note (Signed)
   Declines Pneumovax today.  Discussed importance of safe sexual practice to reduce risk of acquisition/transmission of STI.  Condoms declined.  Due for colon cancer screening and encouraged to follow-up with primary care for colonoscopy scheduling.

## 2019-10-18 NOTE — Assessment & Plan Note (Signed)
Type 2 diabetes appears adequately controlled with current medication regimen with most recent A1c of 6.3.  Continue management per primary care.

## 2019-10-18 NOTE — Progress Notes (Signed)
Subjective:    Patient ID: William Mcfarland, male    DOB: 10-Jun-1956, 63 y.o.   MRN: [id number]  Chief Complaint  Patient presents with  . HIV Positive/AIDS     HPI:  William Mcfarland is a 63 y.o. male with HIV disease who was last seen in the office on 07/19/2019 with good adherence and tolerance to his ART regimen of Biktarvy.  Viral load at the time was undetectable with CD4 count of 775.  Most recent blood work completed on 10/04/2019 with viral load that remains undetectable and CD4 count of 792.  Hemoglobin A1c of 6.3; renal function, liver function, electrolytes within normal ranges.  Healthcare maintenance due includes Pneumovax.  William Mcfarland continues to take his Biktarvy as prescribed with no adverse side effects or missed doses since his last office visit.  Overall feeling very well today having recently completed left total shoulder replacement from which his pain has significantly improved and has had no complications. Denies fevers, chills, night sweats, headaches, changes in vision, neck pain/stiffness, nausea, diarrhea, vomiting, lesions or rashes.  William Mcfarland remains covered through [facility] and has no problems obtaining his medication from the pharmacy.  Denies feelings of being down, depressed, or hopeless recently.  No recreational/illicit drug use or tobacco use.  Alcohol consumption is on occasion.  He is not currently sexually active.   No Known Allergies    Outpatient Medications Prior to Visit  Medication Sig Dispense Refill  . aspirin 81 MG chewable tablet Chew 1 tablet (81 mg total) by mouth daily. 28 tablet 0  . atorvastatin (LIPITOR) 10 MG tablet Take 10 mg by mouth daily in the afternoon.     Marland Kitchen lisinopril-hydrochlorothiazide (ZESTORETIC) 20-12.5 MG tablet Take 1 tablet by mouth 2 (two) times a day.    . metFORMIN (GLUCOPHAGE) 500 MG tablet Take 500 mg by mouth 2 (two) times daily with a meal.    . methocarbamol (ROBAXIN) 500 MG tablet  Take 1 tablet (500 mg total) by mouth every 8 (eight) hours as needed for muscle spasms. 30 tablet 0  . naproxen sodium (ALEVE) 220 MG tablet Take 440 mg by mouth 2 (two) times daily as needed (pain.).    Marland Kitchen oxyCODONE (OXY IR/ROXICODONE) 5 MG immediate release tablet Take 1 tablet (5 mg total) by mouth every 4 (four) hours as needed for moderate pain (pain score 4-6). 42 tablet 0  . bictegravir-emtricitabine-tenofovir AF (BIKTARVY) 50-200-25 MG TABS tablet Take 1 tablet by mouth daily. (Patient taking differently: Take 1 tablet by mouth daily in the afternoon. ) 30 tablet 5  . escitalopram (LEXAPRO) 10 MG tablet Take 10 mg by mouth 4 (four) times a week.      No facility-administered medications prior to visit.      Past Medical History:  Diagnosis Date  . Arthritis    shoulders, knees, hips  . At risk for sleep apnea    STOP-BANG= 5     SENT TO PCP 04-12-2015  . History of colon polyps   . History of radiation therapy 45 Gy plus seed implants on 04-13-2015   prostate external beam 02-20-2015 to 03-24-2015  . Hyperlipidemia   . Hypertension   . Lower urinary tract symptoms (LUTS)   . Prostate cancer [facility]) urologist-  dr ottelin/  oncologist- dr Tammi Klippel   T1c, Gleason 4+3,  PSA 6.83,  vol. 32.56cc--  s/p  external beam radiation 02-20-2015 to 03-24-2015  . Type 2 diabetes mellitus ([place])   .  Type 2 diabetes, diet controlled ([place])   . Wears glasses      Past Surgical History:  Procedure Laterality Date  . COLONOSCOPY W/ POLYPECTOMY  2013  . EYE SURGERY     Cornea replacement left eye (2018)  . ORIF LEFT LEG FX  1988  . ORIF RIGHT ANKLE FX  1985  . PROSTATE BIOPSY    . RADIOACTIVE SEED IMPLANT N/A 04/14/2015   Procedure: RADIOACTIVE SEED IMPLANT;  Surgeon: Kathie Rhodes, MD;  Location: [facility];  Service: Urology;  Laterality: N/A;   64     seeds implanted no seeds found in bladder  . TOTAL SHOULDER ARTHROPLASTY Right 05/21/2016   Procedure: TOTAL SHOULDER  ARTHROPLASTY;  Surgeon: Meredith Pel, MD;  Location: [place];  Service: Orthopedics;  Laterality: Right;  . TOTAL SHOULDER ARTHROPLASTY Left 10/05/2019   Procedure: LEFT Total shoulder arthroplasty;  Surgeon: Meredith Pel, MD;  Location: [place];  Service: Orthopedics;  Laterality: Left;       Review of Systems  Constitutional: Negative for appetite change, chills, fatigue, fever and unexpected weight change.  Eyes: Negative for visual disturbance.  Respiratory: Negative for cough, chest tightness, shortness of breath and wheezing.   Cardiovascular: Negative for chest pain and leg swelling.  Gastrointestinal: Negative for abdominal pain, constipation, diarrhea, nausea and vomiting.  Genitourinary: Negative for dysuria, flank pain, frequency, genital sores, hematuria and urgency.  Skin: Negative for rash.  Allergic/Immunologic: Negative for immunocompromised state.  Neurological: Negative for dizziness and headaches.      Objective:    BP (!) 146/90   Pulse 83   Temp 97.6 F (36.4 C)   Wt 208 lb (94.3 kg)   BMI 33.57 kg/m  Nursing note and vital signs reviewed.  Physical Exam Constitutional:      General: He is not in acute distress.    Appearance: He is well-developed.  Eyes:     Conjunctiva/sclera: Conjunctivae normal.  Neck:     Musculoskeletal: Neck supple.  Cardiovascular:     Rate and Rhythm: Normal rate and regular rhythm.     Heart sounds: Normal heart sounds. No murmur. No friction rub. No gallop.   Pulmonary:     Effort: Pulmonary effort is normal. No respiratory distress.     Breath sounds: Normal breath sounds. No wheezing or rales.  Chest:     Chest wall: No tenderness.  Abdominal:     General: Bowel sounds are normal.     Palpations: Abdomen is soft.     Tenderness: There is no abdominal tenderness.  Lymphadenopathy:     Cervical: No cervical adenopathy.  Skin:    General: Skin is warm and dry.     Findings: No rash.  Neurological:      Mental Status: He is alert and oriented to person, place, and time.  Psychiatric:        Behavior: Behavior normal.        Thought Content: Thought content normal.        Judgment: Judgment normal.      Depression screen [facility] 2/9 10/18/2019 07/19/2019 05/03/2019 03/03/2015 02/24/2015  Decreased Interest 0 0 1 0 0  Down, Depressed, Hopeless 0 0 1 0 0  PHQ - 2 Score 0 0 2 0 0  Altered sleeping - - 1 - -  Tired, decreased energy - - 1 - -  Change in appetite - - 1 - -  Feeling bad or failure about yourself  - - 0 - -  Trouble concentrating - - 0 - -  Moving slowly or fidgety/restless - - 0 - -  Suicidal thoughts - - 0 - -  PHQ-9 Score - - 5 - -       Assessment & Plan:    Patient Active Problem List   Diagnosis Date Noted  . 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 Healthcare maintenance 06/03/2019  . Human immunodeficiency virus (HIV) disease ([place]) 05/03/2019  . Type 2 diabetes mellitus with hyperglycemia, without long-term current use of insulin ([place]) 05/03/2019  . Essential hypertension 05/03/2019  . Class 1 obesity due to excess calories with serious comorbidity and body mass index (BMI) of 32.0 to 32.9 in adult 05/03/2019  . Degenerative arthritis of shoulder region 05/21/2016  . Malignant neoplasm of prostate ([place]) 11/20/2014  . Travel advice encounter 04/01/2014     Problem List Items Addressed This Visit      Cardiovascular and Mediastinum   Essential hypertension    Blood pressure near goal with current medication regimen and no adverse side effects.  He has no red flag/warning symptoms including headache or changes in vision.  Continue management per primary care.        Endocrine   Type 2 diabetes mellitus with hyperglycemia, without long-term current use of insulin (HCC)    Type 2 diabetes appears adequately controlled with current medication regimen with most recent A1c of 6.3.  Continue management per primary care.        Other   Human immunodeficiency  virus (HIV) disease ([place])    William Mcfarland has well-controlled HIV disease and remains undetectable with good adherence and tolerance to his ART regimen of Biktarvy.  He has no signs/symptoms of opportunistic infection or progressive HIV disease.  He has no problems obtaining his medication from the pharmacy.  We discussed and reviewed blood work results as well as plan of care.  Continue current dose of Biktarvy.  Plan for follow-up office visit in 4 months or sooner if needed with lab work 1 to 2 weeks prior to appointment.      Relevant Medications   bictegravir-emtricitabine-tenofovir AF (BIKTARVY) 50-200-25 MG TABS tablet   Healthcare maintenance - Primary     Declines Pneumovax today.  Discussed importance of safe sexual practice to reduce risk of acquisition/transmission of STI.  Condoms declined.  Due for colon cancer screening and encouraged to follow-up with primary care for colonoscopy scheduling.          I am having Lacie Draft maintain his escitalopram, lisinopril-hydrochlorothiazide, atorvastatin, metFORMIN, naproxen sodium, aspirin, oxyCODONE, methocarbamol, and bictegravir-emtricitabine-tenofovir AF.   Meds ordered this encounter  Medications  . bictegravir-emtricitabine-tenofovir AF (BIKTARVY) 50-200-25 MG TABS tablet    Sig: Take 1 tablet by mouth daily.    Dispense:  30 tablet    Refill:  5    Order Specific Question:   Supervising Provider    Answer:   Carlyle Basques [[id number]]     Follow-up: Return in about 4 months (around 02/15/2020), or if symptoms worsen or fail to improve.   Terri Piedra, MSN, FNP-C Nurse Practitioner [facility] [place] number: [phone]

## 2019-10-18 NOTE — Assessment & Plan Note (Signed)
Blood pressure near goal with current medication regimen and no adverse side effects.  He has no red flag/warning symptoms including headache or changes in vision.  Continue management per primary care.

## 2019-10-19 NOTE — Telephone Encounter (Signed)
100 max pls call thx

## 2019-10-19 NOTE — Telephone Encounter (Signed)
IC s/w patient and advised  

## 2019-10-20 ENCOUNTER — Other Ambulatory Visit: Payer: Self-pay

## 2019-10-20 ENCOUNTER — Ambulatory Visit (INDEPENDENT_AMBULATORY_CARE_PROVIDER_SITE_OTHER): Payer: BC Managed Care – PPO | Admitting: Orthopedic Surgery

## 2019-10-20 ENCOUNTER — Ambulatory Visit: Payer: Self-pay

## 2019-10-20 DIAGNOSIS — Z96612 Presence of left artificial shoulder joint: Secondary | ICD-10-CM

## 2019-10-20 NOTE — Progress Notes (Signed)
Post-Op Visit Note   Patient: William Mcfarland           Date of Birth: 27-Oct-1956           MRN: [id number] Visit Date: 10/20/2019 PCP: Kathyrn Lass, MD   Assessment & Plan:  Chief Complaint:  Chief Complaint  Patient presents with  . Follow-up   Visit Diagnoses:  Status post total shoulder arthroplasty  Plan: Patient is a 63 year old male who presents s/p left total shoulder arthroplasty on 10/05/2019.  Patient states he is doing very well.  He has been compliant with wearing the sling.  He is up to 95 degrees on the CPM machine.  He is not taking any more pain medication, instead opting only for Aleve occasionally.  His incision is healing well with no evidence of gapping.  All motor function of his hand is intact; EPL, IP flexion, finger abduction, finger adduction intact.  He has excellent range of motion of the left shoulder on exam.  Sensation intact through all fingers of the left hand.  X-rays of the left shoulder reviewed with patient which showed a well aligned prosthesis with no complicating features.  Plan discontinue the sling.  Patient will start outpatient physical therapy.  Will refill oxycodone in case patient needs some for outpatient therapy.  Plan to follow-up with the office 4 to 5 weeks.  Follow-Up Instructions: No follow-ups on file.   Orders:  Orders Placed This Encounter  Procedures  . XR Shoulder Left   No orders of the defined types were placed in this encounter.   Imaging: No results found.  PMFS History: Patient Active Problem List   Diagnosis Date Noted  . 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 Healthcare maintenance 06/03/2019  . Human immunodeficiency virus (HIV) disease ([place]) 05/03/2019  . Type 2 diabetes mellitus with hyperglycemia, without long-term current use of insulin ([place]) 05/03/2019  . Essential hypertension 05/03/2019  . Class 1 obesity due to excess calories with serious comorbidity and body mass index (BMI) of 32.0  to 32.9 in adult 05/03/2019  . Degenerative arthritis of shoulder region 05/21/2016  . Malignant neoplasm of prostate ([place]) 11/20/2014  . Travel advice encounter 04/01/2014   Past Medical History:  Diagnosis Date  . Arthritis    shoulders, knees, hips  . At risk for sleep apnea    STOP-BANG= 5     SENT TO PCP 04-12-2015  . History of colon polyps   . History of radiation therapy 45 Gy plus seed implants on 04-13-2015   prostate external beam 02-20-2015 to 03-24-2015  . Hyperlipidemia   . Hypertension   . Lower urinary tract symptoms (LUTS)   . Prostate cancer [facility]) urologist-  dr ottelin/  oncologist- dr Tammi Klippel   T1c, Gleason 4+3,  PSA 6.83,  vol. 32.56cc--  s/p  external beam radiation 02-20-2015 to 03-24-2015  . Type 2 diabetes mellitus ([place])   . Type 2 diabetes, diet controlled ([place])   . Wears glasses     Family History  Problem Relation Age of Onset  . Stroke Father   . Stroke Mother   . Cancer Neg Hx     Past Surgical History:  Procedure Laterality Date  . COLONOSCOPY W/ POLYPECTOMY  2013  . EYE SURGERY     Cornea replacement left eye (2018)  . ORIF LEFT LEG FX  1988  . ORIF RIGHT ANKLE FX  1985  . PROSTATE BIOPSY    . RADIOACTIVE SEED IMPLANT N/A 04/14/2015  Procedure: RADIOACTIVE SEED IMPLANT;  Surgeon: Kathie Rhodes, MD;  Location: [facility];  Service: Urology;  Laterality: N/A;   64     seeds implanted no seeds found in bladder  . TOTAL SHOULDER ARTHROPLASTY Right 05/21/2016   Procedure: TOTAL SHOULDER ARTHROPLASTY;  Surgeon: Meredith Pel, MD;  Location: [place];  Service: Orthopedics;  Laterality: Right;  . TOTAL SHOULDER ARTHROPLASTY Left 10/05/2019   Procedure: LEFT Total shoulder arthroplasty;  Surgeon: Meredith Pel, MD;  Location: [place];  Service: Orthopedics;  Laterality: Left;   Social History   Occupational History  . Occupation: Air traffic controller  Tobacco Use  . Smoking status: Former Smoker    Packs/day: 0.50     Years: 15.00    Pack years: 7.50    Types: Cigarettes    Quit date: 04/12/1999    Years since quitting: 20.5  . Smokeless tobacco: Never Used  Substance and Sexual Activity  . Alcohol use: Yes    Comment: occasional  . Drug use: No  . Sexual activity: Not on file

## 2019-10-22 ENCOUNTER — Encounter: Payer: Self-pay | Admitting: Orthopedic Surgery

## 2019-10-22 MED ORDER — OXYCODONE HCL 5 MG PO TABS: 5.0000 mg | ORAL_TABLET | Freq: Two times a day (BID) | ORAL | 0 refills | Status: DC | PRN

## 2019-11-01 ENCOUNTER — Telehealth: Payer: Self-pay | Admitting: Orthopedic Surgery

## 2019-11-01 DIAGNOSIS — Z96612 Presence of left artificial shoulder joint: Secondary | ICD-10-CM

## 2019-11-01 NOTE — Telephone Encounter (Signed)
Please advise what you want patient to be referred for.

## 2019-11-01 NOTE — Addendum Note (Signed)
Addended byLaurann Montana on: 11/01/2019 02:09 PM   Modules accepted: Orders

## 2019-11-01 NOTE — Telephone Encounter (Signed)
Patient called. Would like a referral for PT. He would like to come here for PT.

## 2019-11-01 NOTE — Telephone Encounter (Signed)
Here ok pls calla thx 2 x week 6 w no subscap strengthening first 6 w

## 2019-11-01 NOTE — Telephone Encounter (Signed)
I have put in order for this. Can you please see about getting him scheduled? Thanks.

## 2019-11-18 ENCOUNTER — Other Ambulatory Visit: Payer: Self-pay

## 2019-11-18 ENCOUNTER — Ambulatory Visit (INDEPENDENT_AMBULATORY_CARE_PROVIDER_SITE_OTHER): Payer: BC Managed Care – PPO | Admitting: Physical Therapy

## 2019-11-18 ENCOUNTER — Encounter: Payer: Self-pay | Admitting: Physical Therapy

## 2019-11-18 DIAGNOSIS — M6281 Muscle weakness (generalized): Secondary | ICD-10-CM | POA: Diagnosis not present

## 2019-11-18 DIAGNOSIS — R293 Abnormal posture: Secondary | ICD-10-CM | POA: Diagnosis not present

## 2019-11-18 DIAGNOSIS — M25512 Pain in left shoulder: Secondary | ICD-10-CM

## 2019-11-18 DIAGNOSIS — M25612 Stiffness of left shoulder, not elsewhere classified: Secondary | ICD-10-CM | POA: Diagnosis not present

## 2019-11-18 NOTE — Patient Instructions (Signed)
Access Code: [email or web address]  URL: [email or web address]  Date: 11/18/2019  Prepared by: Faustino Congress   Exercises Standing Shoulder Flexion ROM with Dowel - 10 reps - 1 sets - 2x daily - 7x weekly Standing Shoulder Abduction AAROM with Dowel - 10 reps - 1 sets - 2x daily - 7x weekly Standing Shoulder External Rotation AAROM with Dowel - 10 reps - 1 sets - 2x daily - 7x weekly Standing Bilateral Shoulder Internal Rotation AAROM with Dowel - 10 reps - 1 sets - 2x daily - 7x weekly Standing Shoulder Internal Rotation AAROM with Dowel - 10 reps                   - 1 sets - 2x daily - 7x weekly Scapular Retraction with Resistance - 10 reps - 1 sets - 2x daily - 7x weekly Shoulder extension with resistance - Neutral - 10 reps - 1 sets - 2x daily - 7x weekly Shoulder External Rotation with Anchored Resistance - 10 reps - 1 sets - 2x daily - 7x weekly

## 2019-11-18 NOTE — Therapy (Signed)
[facility] Physical Therapy [address] [place], [place], [address] Phone: [phone]   Fax:  [phone]  Physical Therapy Evaluation  Patient Details  Name: William Mcfarland MRN: [id number] Date of Birth: Jun 15, 1956 Referring Provider (PT): Dr. Meredith Pel   Encounter Date: 11/18/2019  PT End of Session - 11/18/19 1139    Visit Number  1    Number of Visits  12    Date for PT Re-Evaluation  12/30/19    PT Start Time  1100    PT Stop Time  1133    PT Time Calculation (min)  33 min    Activity Tolerance  Patient tolerated treatment well    Behavior During Therapy  [facility] for tasks assessed/performed       Past Medical History:  Diagnosis Date  . Arthritis    shoulders, knees, hips  . At risk for sleep apnea    STOP-BANG= 5     SENT TO PCP 04-12-2015  . History of colon polyps   . History of radiation therapy 45 Gy plus seed implants on 04-13-2015   prostate external beam 02-20-2015 to 03-24-2015  . Hyperlipidemia   . Hypertension   . Lower urinary tract symptoms (LUTS)   . Prostate cancer [facility]) urologist-  dr ottelin/  oncologist- dr Tammi Klippel   T1c, Gleason 4+3,  PSA 6.83,  vol. 32.56cc--  s/p  external beam radiation 02-20-2015 to 03-24-2015  . Type 2 diabetes mellitus ([place])   . Type 2 diabetes, diet controlled ([place])   . Wears glasses     Past Surgical History:  Procedure Laterality Date  . COLONOSCOPY W/ POLYPECTOMY  2013  . EYE SURGERY     Cornea replacement left eye (2018)  . ORIF LEFT LEG FX  1988  . ORIF RIGHT ANKLE FX  1985  . PROSTATE BIOPSY    . RADIOACTIVE SEED IMPLANT N/A 04/14/2015   Procedure: RADIOACTIVE SEED IMPLANT;  Surgeon: Kathie Rhodes, MD;  Location: [facility];  Service: Urology;  Laterality: N/A;   64     seeds implanted no seeds found in bladder  . TOTAL SHOULDER ARTHROPLASTY Right 05/21/2016   Procedure: TOTAL SHOULDER ARTHROPLASTY;  Surgeon: Meredith Pel, MD;  Location: [place];  Service:  Orthopedics;  Laterality: Right;  . TOTAL SHOULDER ARTHROPLASTY Left 10/05/2019   Procedure: LEFT Total shoulder arthroplasty;  Surgeon: Meredith Pel, MD;  Location: [place];  Service: Orthopedics;  Laterality: Left;    There were no vitals filed for this visit.   Subjective Assessment - 11/18/19 1104    Subjective  Pt is a 63 y/o male who presents to [facility] s/p Lt total shoulder on 10/05/19.  Pt presents today with complaints of decreased ROM and strength.    Patient Stated Goals  improve ROM, regain function    Currently in Pain?  Yes    Pain Score  0-No pain   up to 4/10   Pain Location  Shoulder    Pain Orientation  Left    Pain Descriptors / Indicators  Aching    Pain Type  Acute pain;Surgical pain    Pain Onset  More than a month ago    Pain Frequency  Intermittent    Aggravating Factors   lying on left side    Pain Relieving Factors  repositioning         [facility] PT Assessment - 11/18/19 1107      Assessment   Medical Diagnosis  Lt TSA  Referring Provider (PT)  Dr. Meredith Pel    Onset Date/Surgical Date  10/05/19    Hand Dominance  Right    Next MD Visit  11/24/19    Prior Therapy  following Rt TSA      Precautions   Precaution Comments  no subscap strengthening first 6 weeks      Restrictions   Weight Bearing Restrictions  No      Balance Screen   Has the patient fallen in the past 6 months  No    Has the patient had a decrease in activity level because of a fear of falling?   No    Is the patient reluctant to leave their home because of a fear of falling?   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, physical job with some lifting    Leisure  "a lot of nothing"  target shooting; no regular exercise      Cognition   Overall Cognitive Status  Within Functional Limits for tasks assessed      Posture/Postural Control   Posture/Postural Control  Postural limitations    Postural Limitations  Rounded Shoulders;Forward head      ROM / Strength   AROM / PROM / Strength  AROM;Strength;PROM      AROM   Overall AROM Comments  flex/abdct measured in sitting    AROM Assessment Site  Shoulder    Right/Left Shoulder  Right;Left    Right Shoulder Flexion  158 Degrees    Right Shoulder ABduction  150 Degrees    Right Shoulder Internal Rotation  86 Degrees    Right Shoulder External Rotation  82 Degrees    Left Shoulder Flexion  135 Degrees    Left Shoulder ABduction  108 Degrees    Left Shoulder Internal Rotation  45 Degrees    Left Shoulder External Rotation  55 Degrees      PROM   PROM Assessment Site  Shoulder    Right/Left Shoulder  Left    Left Shoulder Flexion  152 Degrees    Left Shoulder ABduction  131 Degrees    Left Shoulder Internal Rotation  52 Degrees    Left Shoulder External Rotation  58 Degrees      Strength   Overall Strength Comments  within available range    Strength Assessment Site  Shoulder    Right/Left Shoulder  Left    Left Shoulder Flexion  4-/5    Left Shoulder ABduction  3+/5    Left Shoulder External Rotation  4-/5                Objective measurements completed on examination: See above findings.      [facility] Adult PT Treatment/Exercise - 11/18/19 1107      Exercises   Exercises  Shoulder      Shoulder Exercises: Standing   External Rotation  Left;10 reps;Theraband    Theraband Level (Shoulder External Rotation)  Level 2 (Red)    Extension  Both;10 reps;Theraband    Theraband Level (Shoulder Extension)  Level 2 (Red)    Row  Both;10 reps;Theraband  Theraband Level (Shoulder Row)  Level 2 (Red)    Other Standing Exercises  standing cane exercises with 3# bar for ROM: flexion, abduction, er and ir  x 10 reps each             PT Education - 11/18/19 1139    Education Details  HEP    Person(s) Educated  Patient    Methods  Explanation;Demonstration;Handout    Comprehension  Verbalized understanding;Returned demonstration          PT Long Term Goals - 11/18/19 1309      PT LONG TERM GOAL #1   Title  independent with HEP    Status  New    Target Date  12/30/19      PT LONG TERM GOAL #2   Title  Lt shoulder AROM improved by 10 deg flexion/abduction/er/ir for improved function    Status  New    Target Date  12/30/19      PT LONG TERM GOAL #3   Title  report pain < 3/10 with activity for improved function    Status  New    Target Date  12/30/19      PT LONG TERM GOAL #4   Title  demonstrate at least 4/5 Lt shoulder strength for improved function    Status  New    Target Date  12/30/19             Plan - 11/18/19 1303    Clinical Impression Statement  Pt is a 63 y/o male who presents to [facility] s/p Lt TSA on 10/05/19.  Pt overall doing well post-op presenting with ROM and strength limitations, postural limitation as well as some residual post op pain affecting function.  Pt will benefit from PT to address deficits listed.    Personal Factors and Comorbidities  Comorbidity 3+;Past/Current Experience    Comorbidities  OA, HTN, prostate cancer, DM, HIV+    Examination-Activity Limitations  Bathing;Lift;Reach Overhead;Carry;Hygiene/Grooming    Examination-Participation Restrictions  Other   occupation   Stability/Clinical Decision Making  Stable/Uncomplicated    Clinical Decision Making  Low    Rehab Potential  Good    PT Frequency  2x / week    PT Duration  6 weeks    PT Treatment/Interventions  ADLs/Self Care Home Management;Cryotherapy;Electrical Stimulation;Moist Heat;Therapeutic activities;Therapeutic exercise;Patient/family education;Neuromuscular re-education;Manual techniques;Vasopneumatic Device;Taping;Passive range of motion    PT Next Visit Plan   review HEP, progress ROM/strengthening (no subscap x 6 wks), manual/modalities PRN    PT Home Exercise Plan  Access Code: [email or web address]    Consulted and Agree with Plan of Care  Patient       Patient will benefit from skilled therapeutic intervention in order to improve the following deficits and impairments:  Pain, Postural dysfunction, Decreased range of motion, Decreased strength, Impaired UE functional use, Impaired flexibility  Visit Diagnosis: Acute pain of left shoulder - Plan: PT plan of care cert/re-cert  Stiffness of left shoulder, not elsewhere classified - Plan: PT plan of care cert/re-cert  Abnormal posture - Plan: PT plan of care cert/re-cert  Muscle weakness (generalized) - Plan: PT plan of care cert/re-cert     Problem List Patient Active Problem List   Diagnosis Date Noted  . 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 Healthcare maintenance 06/03/2019  . Human immunodeficiency virus (HIV) disease ([place]) 05/03/2019  . Type 2 diabetes mellitus with hyperglycemia, without long-term current use of insulin ([place]) 05/03/2019  . Essential hypertension 05/03/2019  . Class 1  obesity due to excess calories with serious comorbidity and body mass index (BMI) of 32.0 to 32.9 in adult 05/03/2019  . Degenerative arthritis of shoulder region 05/21/2016  . Malignant neoplasm of prostate ([place]) 11/20/2014  . Travel advice encounter 04/01/2014      Laureen Abrahams, PT, DPT 11/18/19 1:13 PM     [facility] Physical Therapy [address] [place], [place], [address] Phone: [phone]   Fax:  [phone]  Name: BURNET ALEN MRN: [id number] Date of Birth: [birth year]

## 2019-11-24 ENCOUNTER — Ambulatory Visit (INDEPENDENT_AMBULATORY_CARE_PROVIDER_SITE_OTHER): Payer: BC Managed Care – PPO | Admitting: Orthopedic Surgery

## 2019-11-24 ENCOUNTER — Encounter: Payer: Self-pay | Admitting: Orthopedic Surgery

## 2019-11-24 ENCOUNTER — Other Ambulatory Visit: Payer: Self-pay

## 2019-11-24 DIAGNOSIS — Z96612 Presence of left artificial shoulder joint: Secondary | ICD-10-CM

## 2019-11-25 ENCOUNTER — Encounter: Payer: Self-pay | Admitting: Physical Therapy

## 2019-11-25 ENCOUNTER — Ambulatory Visit (INDEPENDENT_AMBULATORY_CARE_PROVIDER_SITE_OTHER): Payer: BC Managed Care – PPO | Admitting: Physical Therapy

## 2019-11-25 DIAGNOSIS — M6281 Muscle weakness (generalized): Secondary | ICD-10-CM

## 2019-11-25 DIAGNOSIS — R293 Abnormal posture: Secondary | ICD-10-CM

## 2019-11-25 DIAGNOSIS — M25512 Pain in left shoulder: Secondary | ICD-10-CM | POA: Diagnosis not present

## 2019-11-25 DIAGNOSIS — M25612 Stiffness of left shoulder, not elsewhere classified: Secondary | ICD-10-CM | POA: Diagnosis not present

## 2019-11-25 MED ORDER — HYDROCODONE-ACETAMINOPHEN 5-325 MG PO TABS: 1.0000 | ORAL_TABLET | Freq: Two times a day (BID) | ORAL | 0 refills | Status: DC | PRN

## 2019-11-25 NOTE — Therapy (Signed)
[facility] Physical Therapy [address] [place], [place], [address] Phone: [phone]   Fax:  [phone]  Physical Therapy Treatment  Patient Details  Name: William Mcfarland MRN: [id number] Date of Birth: 05-Mar-1956 Referring Provider (PT): Dr. Meredith Pel   Encounter Date: 11/25/2019  PT End of Session - 11/25/19 1231    Visit Number  2    Number of Visits  12    Date for PT Re-Evaluation  12/30/19    PT Start Time  1143    PT Stop Time  1225    PT Time Calculation (min)  42 min    Activity Tolerance  Patient tolerated treatment well    Behavior During Therapy  [facility] for tasks assessed/performed       Past Medical History:  Diagnosis Date  . Arthritis    shoulders, knees, hips  . At risk for sleep apnea    STOP-BANG= 5     SENT TO PCP 04-12-2015  . History of colon polyps   . History of radiation therapy 45 Gy plus seed implants on 04-13-2015   prostate external beam 02-20-2015 to 03-24-2015  . Hyperlipidemia   . Hypertension   . Lower urinary tract symptoms (LUTS)   . Prostate cancer [facility]) urologist-  dr ottelin/  oncologist- dr Tammi Klippel   T1c, Gleason 4+3,  PSA 6.83,  vol. 32.56cc--  s/p  external beam radiation 02-20-2015 to 03-24-2015  . Type 2 diabetes mellitus ([place])   . Type 2 diabetes, diet controlled ([place])   . Wears glasses     Past Surgical History:  Procedure Laterality Date  . COLONOSCOPY W/ POLYPECTOMY  2013  . EYE SURGERY     Cornea replacement left eye (2018)  . ORIF LEFT LEG FX  1988  . ORIF RIGHT ANKLE FX  1985  . PROSTATE BIOPSY    . RADIOACTIVE SEED IMPLANT N/A 04/14/2015   Procedure: RADIOACTIVE SEED IMPLANT;  Surgeon: Kathie Rhodes, MD;  Location: [facility];  Service: Urology;  Laterality: N/A;   64     seeds implanted no seeds found in bladder  . TOTAL SHOULDER ARTHROPLASTY Right 05/21/2016   Procedure: TOTAL SHOULDER ARTHROPLASTY;  Surgeon: Meredith Pel, MD;  Location: [place];  Service:  Orthopedics;  Laterality: Right;  . TOTAL SHOULDER ARTHROPLASTY Left 10/05/2019   Procedure: LEFT Total shoulder arthroplasty;  Surgeon: Meredith Pel, MD;  Location: [place];  Service: Orthopedics;  Laterality: Left;    There were no vitals filed for this visit.  Subjective Assessment - 11/25/19 1146    Subjective  doing well; saw MD yesterday - continue with POC    Patient Stated Goals  improve ROM, regain function    Pain Score  0-No pain    Pain Onset  More than a month ago                       [facility] Adult PT Treatment/Exercise - 11/25/19 1146      Shoulder Exercises: Standing   External Rotation  Left;20 reps;Theraband    Theraband Level (Shoulder External Rotation)  Level 2 (Red)    Flexion  Left;15 reps;Weights    Shoulder Flexion Weight (lbs)  3# bar    ABduction  Left;15 reps;Weights    Shoulder ABduction Weight (lbs)  1# bar    ABduction Limitations  scaption    Extension  Both;20 reps;Theraband    Theraband Level (Shoulder Extension)  Level 2 (Red)  Row  Both;20 reps;Theraband    Theraband Level (Shoulder Row)  Level 2 (Red)    Other Standing Exercises  standing cane exercises with 3# bar for ROM: flexion, abduction, er and ir x 10 reps each    Other Standing Exercises  wall ladder flexion/scaption x10 reps; Lt      Shoulder Exercises: Pulleys   Flexion  2 minutes    Scaption  2 minutes      Shoulder Exercises: ROM/Strengthening   UBE (Upper Arm Bike)  L1 x 4 min (2' each direction)                  PT Long Term Goals - 11/18/19 1309      PT LONG TERM GOAL #1   Title  independent with HEP    Status  New    Target Date  12/30/19      PT LONG TERM GOAL #2   Title  Lt shoulder AROM improved by 10 deg flexion/abduction/er/ir for improved function    Status  New    Target Date  12/30/19      PT LONG TERM GOAL #3   Title  report pain < 3/10 with activity for improved function    Status  New    Target Date  12/30/19       PT LONG TERM GOAL #4   Title  demonstrate at least 4/5 Lt shoulder strength for improved function    Status  New    Target Date  12/30/19            Plan - 11/25/19 1231    Clinical Impression Statement  Pt needed min cues for review of HEP, but consistent and compliant with program at home.  Overall progressing well, noted pain with abduction with resistance, so need to slowly progress strength.  Pt will benefit from PT to maximize function.    Personal Factors and Comorbidities  Comorbidity 3+;Past/Current Experience    Comorbidities  OA, HTN, prostate cancer, DM, HIV+    Examination-Activity Limitations  Bathing;Lift;Reach Overhead;Carry;Hygiene/Grooming    Examination-Participation Restrictions  Other   occupation   Stability/Clinical Decision Making  Stable/Uncomplicated    Rehab Potential  Good    PT Frequency  2x / week    PT Duration  6 weeks    PT Treatment/Interventions  ADLs/Self Care Home Management;Cryotherapy;Electrical Stimulation;Moist Heat;Therapeutic activities;Therapeutic exercise;Patient/family education;Neuromuscular re-education;Manual techniques;Vasopneumatic Device;Taping;Passive range of motion    PT Next Visit Plan  progress ROM/strengthening (no subscap x 6 wks), manual/modalities PRN    PT Home Exercise Plan  Access Code: [email or web address]    Consulted and Agree with Plan of Care  Patient       Patient will benefit from skilled therapeutic intervention in order to improve the following deficits and impairments:  Pain, Postural dysfunction, Decreased range of motion, Decreased strength, Impaired UE functional use, Impaired flexibility  Visit Diagnosis: Acute pain of left shoulder  Stiffness of left shoulder, not elsewhere classified  Abnormal posture  Muscle weakness (generalized)     Problem List Patient Active Problem List   Diagnosis Date Noted  . 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 Healthcare maintenance 06/03/2019  . Human  immunodeficiency virus (HIV) disease ([place]) 05/03/2019  . Type 2 diabetes mellitus with hyperglycemia, without long-term current use of insulin ([place]) 05/03/2019  . Essential hypertension 05/03/2019  . Class 1 obesity due to excess calories with serious comorbidity and body mass index (BMI) of 32.0 to 32.9 in adult 05/03/2019  .  Degenerative arthritis of shoulder region 05/21/2016  . Malignant neoplasm of prostate ([place]) 11/20/2014  . Travel advice encounter 04/01/2014       Laureen Abrahams, PT, DPT 11/25/19 12:51 PM     [facility] Physical Therapy [address] [place], [place], [address] Phone: [phone]   Fax:  [phone]  Name: KYSEEM LYNSKEY MRN: [id number] Date of Birth: 1956-11-12

## 2019-11-26 ENCOUNTER — Encounter: Payer: Self-pay | Admitting: Orthopedic Surgery

## 2019-11-26 ENCOUNTER — Ambulatory Visit (INDEPENDENT_AMBULATORY_CARE_PROVIDER_SITE_OTHER): Payer: BC Managed Care – PPO | Admitting: Physical Therapy

## 2019-11-26 ENCOUNTER — Other Ambulatory Visit: Payer: Self-pay

## 2019-11-26 DIAGNOSIS — R293 Abnormal posture: Secondary | ICD-10-CM | POA: Diagnosis not present

## 2019-11-26 DIAGNOSIS — M25512 Pain in left shoulder: Secondary | ICD-10-CM | POA: Diagnosis not present

## 2019-11-26 DIAGNOSIS — M25612 Stiffness of left shoulder, not elsewhere classified: Secondary | ICD-10-CM | POA: Diagnosis not present

## 2019-11-26 DIAGNOSIS — M6281 Muscle weakness (generalized): Secondary | ICD-10-CM

## 2019-11-26 NOTE — Therapy (Signed)
[facility] Physical Therapy [address] [place], [place], [address] Phone: [phone]   Fax:  [phone]  Physical Therapy Treatment  Patient Details  Name: William Mcfarland MRN: [id number] Date of Birth: 1956/01/08 Referring Provider (PT): Dr. Meredith Pel   Encounter Date: 11/26/2019  PT End of Session - 11/26/19 1444    Visit Number  3    Number of Visits  12    Date for PT Re-Evaluation  12/30/19    PT Start Time  [id number]    PT Stop Time  1435    PT Time Calculation (min)  38 min    Activity Tolerance  Patient tolerated treatment well    Behavior During Therapy  [facility] for tasks assessed/performed       Past Medical History:  Diagnosis Date  . Arthritis    shoulders, knees, hips  . At risk for sleep apnea    STOP-BANG= 5     SENT TO PCP 04-12-2015  . History of colon polyps   . History of radiation therapy 45 Gy plus seed implants on 04-13-2015   prostate external beam 02-20-2015 to 03-24-2015  . Hyperlipidemia   . Hypertension   . Lower urinary tract symptoms (LUTS)   . Prostate cancer [facility]) urologist-  dr ottelin/  oncologist- dr Tammi Klippel   T1c, Gleason 4+3,  PSA 6.83,  vol. 32.56cc--  s/p  external beam radiation 02-20-2015 to 03-24-2015  . Type 2 diabetes mellitus ([place])   . Type 2 diabetes, diet controlled ([place])   . Wears glasses     Past Surgical History:  Procedure Laterality Date  . COLONOSCOPY W/ POLYPECTOMY  2013  . EYE SURGERY     Cornea replacement left eye (2018)  . ORIF LEFT LEG FX  1988  . ORIF RIGHT ANKLE FX  1985  . PROSTATE BIOPSY    . RADIOACTIVE SEED IMPLANT N/A 04/14/2015   Procedure: RADIOACTIVE SEED IMPLANT;  Surgeon: Kathie Rhodes, MD;  Location: [facility];  Service: Urology;  Laterality: N/A;   64     seeds implanted no seeds found in bladder  . TOTAL SHOULDER ARTHROPLASTY Right 05/21/2016   Procedure: TOTAL SHOULDER ARTHROPLASTY;  Surgeon: Meredith Pel, MD;  Location: [place];  Service:  Orthopedics;  Laterality: Right;  . TOTAL SHOULDER ARTHROPLASTY Left 10/05/2019   Procedure: LEFT Total shoulder arthroplasty;  Surgeon: Meredith Pel, MD;  Location: [place];  Service: Orthopedics;  Laterality: Left;    There were no vitals filed for this visit.  Subjective Assessment - 11/26/19 1438    Subjective  no pain upon arrival, does have some mild pain, N/T  lifting weight up into scaption/abuction but no other complaints.    Patient Stated Goals  improve ROM, regain function    Pain Onset  More than a month ago                       [facility] Adult PT Treatment/Exercise - 11/26/19 0001      Shoulder Exercises: Standing   External Rotation  Left;20 reps;Theraband    Theraband Level (Shoulder External Rotation)  Level 3 (Green)    Internal Rotation  Left;20 reps    Theraband Level (Shoulder Internal Rotation)  Level 3 (Green)    Flexion  Left;15 reps;Weights    Shoulder Flexion Weight (lbs)  3# bar    ABduction  Left;15 reps;Weights    Shoulder ABduction Weight (lbs)  2# bar   cues  not to compensate with shrug   ABduction Limitations  scaption    Extension  Both;20 reps;Theraband    Theraband Level (Shoulder Extension)  Level 3 (Green)    Row  [employer];Theraband    Theraband Level (Shoulder Row)  Level 3 (Green)    Other Standing Exercises  standing cane exercises with 3# bar for ROM: flexion, abduction, IR/ER X 15 reps ea    Other Standing Exercises  shoulder press 3#bar 3 sets of 5      Shoulder Exercises: Pulleys   Flexion  2 minutes    Scaption  2 minutes      Shoulder Exercises: Therapy Ball   Flexion  Both;15 reps    Flexion Limitations  holding 5 sec    Scaption  Left;15 reps    Scaption Limitations  holding 5 sec      Shoulder Exercises: ROM/Strengthening   UBE (Upper Arm Bike)  L1 x 4 min (2' each direction)                  PT Long Term Goals - 11/18/19 1309      PT LONG TERM GOAL #1   Title  independent with HEP     Status  New    Target Date  12/30/19      PT LONG TERM GOAL #2   Title  Lt shoulder AROM improved by 10 deg flexion/abduction/er/ir for improved function    Status  New    Target Date  12/30/19      PT LONG TERM GOAL #3   Title  report pain < 3/10 with activity for improved function    Status  New    Target Date  12/30/19      PT LONG TERM GOAL #4   Title  demonstrate at least 4/5 Lt shoulder strength for improved function    Status  New    Target Date  12/30/19            Plan - 11/26/19 1444    Clinical Impression Statement  Able to progress strength some this session with good tolerance but needs cues to not compensate with shoulder shrug. Also had him break up reps more to avoid shrugging. Overall he has good ROM will continue to need strength progression.    Personal Factors and Comorbidities  Comorbidity 3+;Past/Current Experience    Comorbidities  OA, HTN, prostate cancer, DM, HIV+    Examination-Activity Limitations  Bathing;Lift;Reach Overhead;Carry;Hygiene/Grooming    Examination-Participation Restrictions  Other   occupation   Stability/Clinical Decision Making  Stable/Uncomplicated    Rehab Potential  Good    PT Frequency  2x / week    PT Duration  6 weeks    PT Treatment/Interventions  ADLs/Self Care Home Management;Cryotherapy;Electrical Stimulation;Moist Heat;Therapeutic activities;Therapeutic exercise;Patient/family education;Neuromuscular re-education;Manual techniques;Vasopneumatic Device;Taping;Passive range of motion    PT Next Visit Plan  progress ROM/strengthenin, manual/modalities PRN    PT Home Exercise Plan  Access Code: [email or web address]    Consulted and Agree with Plan of Care  Patient       Patient will benefit from skilled therapeutic intervention in order to improve the following deficits and impairments:  Pain, Postural dysfunction, Decreased range of motion, Decreased strength, Impaired UE functional use, Impaired flexibility  Visit  Diagnosis: Acute pain of left shoulder  Stiffness of left shoulder, not elsewhere classified  Abnormal posture  Muscle weakness (generalized)     Problem List Patient Active Problem List   Diagnosis Date Noted  .  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 Healthcare maintenance 06/03/2019  . Human immunodeficiency virus (HIV) disease ([place]) 05/03/2019  . Type 2 diabetes mellitus with hyperglycemia, without long-term current use of insulin ([place]) 05/03/2019  . Essential hypertension 05/03/2019  . Class 1 obesity due to excess calories with serious comorbidity and body mass index (BMI) of 32.0 to 32.9 in adult 05/03/2019  . Degenerative arthritis of shoulder region 05/21/2016  . Malignant neoplasm of prostate ([place]) 11/20/2014  . Travel advice encounter 04/01/2014    Silvestre Mesi 11/26/2019, 2:48 PM  [facility] Physical Therapy [address] [place], [place], [address] Phone: [phone]   Fax:  [phone]  Name: William Mcfarland MRN: [id number] Date of Birth: [birth year]

## 2019-11-26 NOTE — Progress Notes (Signed)
Post-Op Visit Note   Patient: William Mcfarland           Date of Birth: June 01, 1956           MRN: [id number] Visit Date: 11/24/2019 PCP: Kathyrn Lass, MD   Assessment & Plan:  Chief Complaint:  Chief Complaint  Patient presents with  . Left Shoulder - Routine Post Op, Follow-up   Visit Diagnoses:  1. Status post total replacement of left shoulder     Plan: Patient is a 63 year old male who presents s/p left total shoulder arthroplasty on 10/05/2019.  Patient states that he started physical therapy last week.  He has been to 1 session so far.  He notes that he is doing well overall but he does still feel some soreness.  He has been able to sleep more on his left side recently.  He has discontinued his CPM machine.  Incision is healing well on exam.  He has a 5 degrees of abduction and 105 degrees of forward flexion.  He has good subscapularis strength.  Refilled pain medicine in the form of Norco every 12 hours as needed.  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  Plan for patient to return to the office on 12/20/2019 for return to work evaluation.  Patient agreed with this plan and will follow up on this date.  Follow-Up Instructions: No follow-ups on file.   Orders:  No orders of the defined types were placed in this encounter.  Meds ordered this encounter  Medications  . HYDROcodone-acetaminophen (NORCO/VICODIN) 5-325 MG tablet    Sig: Take 1 tablet by mouth every 12 (twelve) hours as needed for moderate pain.    Dispense:  35 tablet    Refill:  0    Imaging: No results found.  PMFS History: Patient Active Problem List   Diagnosis Date Noted  . 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 Healthcare maintenance 06/03/2019  . Human immunodeficiency virus (HIV) disease ([place]) 05/03/2019  . Type 2 diabetes mellitus with hyperglycemia, without long-term current use of insulin ([place]) 05/03/2019  . Essential hypertension 05/03/2019  . Class 1 obesity  due to excess calories with serious comorbidity and body mass index (BMI) of 32.0 to 32.9 in adult 05/03/2019  . Degenerative arthritis of shoulder region 05/21/2016  . Malignant neoplasm of prostate ([place]) 11/20/2014  . Travel advice encounter 04/01/2014   Past Medical History:  Diagnosis Date  . Arthritis    shoulders, knees, hips  . At risk for sleep apnea    STOP-BANG= 5     SENT TO PCP 04-12-2015  . History of colon polyps   . History of radiation therapy 45 Gy plus seed implants on 04-13-2015   prostate external beam 02-20-2015 to 03-24-2015  . Hyperlipidemia   . Hypertension   . Lower urinary tract symptoms (LUTS)   . Prostate cancer [facility]) urologist-  dr ottelin/  oncologist- dr Tammi Klippel   T1c, Gleason 4+3,  PSA 6.83,  vol. 32.56cc--  s/p  external beam radiation 02-20-2015 to 03-24-2015  . Type 2 diabetes mellitus ([place])   . Type 2 diabetes, diet controlled ([place])   . Wears glasses     Family History  Problem Relation Age of Onset  . Stroke Father   . Stroke Mother   . Cancer Neg Hx     Past Surgical History:  Procedure Laterality Date  . COLONOSCOPY W/ POLYPECTOMY  2013  . EYE SURGERY  Cornea replacement left eye (2018)  . ORIF LEFT LEG FX  1988  . ORIF RIGHT ANKLE FX  1985  . PROSTATE BIOPSY    . RADIOACTIVE SEED IMPLANT N/A 04/14/2015   Procedure: RADIOACTIVE SEED IMPLANT;  Surgeon: Kathie Rhodes, MD;  Location: [facility];  Service: Urology;  Laterality: N/A;   64     seeds implanted no seeds found in bladder  . TOTAL SHOULDER ARTHROPLASTY Right 05/21/2016   Procedure: TOTAL SHOULDER ARTHROPLASTY;  Surgeon: Meredith Pel, MD;  Location: [place];  Service: Orthopedics;  Laterality: Right;  . TOTAL SHOULDER ARTHROPLASTY Left 10/05/2019   Procedure: LEFT Total shoulder arthroplasty;  Surgeon: Meredith Pel, MD;  Location: [place];  Service: Orthopedics;  Laterality: Left;   Social History   Occupational History  . Occupation: Sales executive  Tobacco Use  . Smoking status: Former Smoker    Packs/day: 0.50    Years: 15.00    Pack years: 7.50    Types: Cigarettes    Quit date: 04/12/1999    Years since quitting: 20.6  . Smokeless tobacco: Never Used  Substance and Sexual Activity  . Alcohol use: Yes    Comment: occasional  . Drug use: No  . Sexual activity: Not on file

## 2019-11-29 ENCOUNTER — Encounter: Payer: Self-pay | Admitting: Physical Therapy

## 2019-11-29 ENCOUNTER — Ambulatory Visit (INDEPENDENT_AMBULATORY_CARE_PROVIDER_SITE_OTHER): Payer: BC Managed Care – PPO | Admitting: Physical Therapy

## 2019-11-29 ENCOUNTER — Other Ambulatory Visit: Payer: Self-pay

## 2019-11-29 DIAGNOSIS — R293 Abnormal posture: Secondary | ICD-10-CM | POA: Diagnosis not present

## 2019-11-29 DIAGNOSIS — M25612 Stiffness of left shoulder, not elsewhere classified: Secondary | ICD-10-CM | POA: Diagnosis not present

## 2019-11-29 DIAGNOSIS — M6281 Muscle weakness (generalized): Secondary | ICD-10-CM | POA: Diagnosis not present

## 2019-11-29 DIAGNOSIS — M25512 Pain in left shoulder: Secondary | ICD-10-CM | POA: Diagnosis not present

## 2019-11-29 NOTE — Therapy (Addendum)
[facility] Physical Therapy [address] [place], [place], [address] Phone: [phone]   Fax:  [phone]  Physical Therapy Treatment/Discharge Summary  Patient Details  Name: William Mcfarland MRN: [id number] Date of Birth: 10-08-1956 Referring Provider (PT): Dr. Meredith Pel   Encounter Date: 11/29/2019  PT End of Session - 11/29/19 0835    Visit Number  4    Number of Visits  12    Date for PT Re-Evaluation  12/30/19    PT Start Time  0801    PT Stop Time  0840    PT Time Calculation (min)  39 min    Activity Tolerance  Patient tolerated treatment well    Behavior During Therapy  [facility] for tasks assessed/performed       Past Medical History:  Diagnosis Date  . Arthritis    shoulders, knees, hips  . At risk for sleep apnea    STOP-BANG= 5     SENT TO PCP 04-12-2015  . History of colon polyps   . History of radiation therapy 45 Gy plus seed implants on 04-13-2015   prostate external beam 02-20-2015 to 03-24-2015  . Hyperlipidemia   . Hypertension   . Lower urinary tract symptoms (LUTS)   . Prostate cancer [facility]) urologist-  dr ottelin/  oncologist- dr Tammi Klippel   T1c, Gleason 4+3,  PSA 6.83,  vol. 32.56cc--  s/p  external beam radiation 02-20-2015 to 03-24-2015  . Type 2 diabetes mellitus ([place])   . Type 2 diabetes, diet controlled ([place])   . Wears glasses     Past Surgical History:  Procedure Laterality Date  . COLONOSCOPY W/ POLYPECTOMY  2013  . EYE SURGERY     Cornea replacement left eye (2018)  . ORIF LEFT LEG FX  1988  . ORIF RIGHT ANKLE FX  1985  . PROSTATE BIOPSY    . RADIOACTIVE SEED IMPLANT N/A 04/14/2015   Procedure: RADIOACTIVE SEED IMPLANT;  Surgeon: Kathie Rhodes, MD;  Location: [facility];  Service: Urology;  Laterality: N/A;   64     seeds implanted no seeds found in bladder  . TOTAL SHOULDER ARTHROPLASTY Right 05/21/2016   Procedure: TOTAL SHOULDER ARTHROPLASTY;  Surgeon: Meredith Pel, MD;  Location:  [place];  Service: Orthopedics;  Laterality: Right;  . TOTAL SHOULDER ARTHROPLASTY Left 10/05/2019   Procedure: LEFT Total shoulder arthroplasty;  Surgeon: Meredith Pel, MD;  Location: [place];  Service: Orthopedics;  Laterality: Left;    There were no vitals filed for this visit.  Subjective Assessment - 11/29/19 0802    Subjective  doing well; only has a little discomfort with exercises.  otherwise shoulder is doing well.  drove motorcycle on Friday; no issues.    Patient Stated Goals  improve ROM, regain function    Currently in Pain?  No/denies    Pain Onset  More than a month ago                       [facility] Adult PT Treatment/Exercise - 11/29/19 0809      Shoulder Exercises: Standing   Horizontal ABduction  Left;20 reps    Horizontal ABduction Weight (lbs)  no weight    External Rotation  Left;20 reps;Theraband    Theraband Level (Shoulder External Rotation)  Level 4 (Blue)    Internal Rotation  Left;20 reps    Theraband Level (Shoulder Internal Rotation)  Level 4 (Blue)    Flexion  Left;Weights;20 reps  Shoulder Flexion Weight (lbs)  3# bar    ABduction  Left;Weights;20 reps    Shoulder ABduction Weight (lbs)  2# bar    ABduction Limitations  scaption    Extension  Both;20 reps;Theraband    Theraband Level (Shoulder Extension)  Level 4 (Blue)    Row  Both;20 reps;Theraband    Theraband Level (Shoulder Row)  Level 4 (Blue)      Shoulder Exercises: Pulleys   Flexion  2 minutes    ABduction  2 minutes      Shoulder Exercises: ROM/Strengthening   UBE (Upper Arm Bike)  L2 x 6 min (3' each direction)                  PT Long Term Goals - 11/18/19 1309      PT LONG TERM GOAL #1   Title  independent with HEP    Status  New    Target Date  12/30/19      PT LONG TERM GOAL #2   Title  Lt shoulder AROM improved by 10 deg flexion/abduction/er/ir for improved function    Status  New    Target Date  12/30/19      PT LONG TERM GOAL #3    Title  report pain < 3/10 with activity for improved function    Status  New    Target Date  12/30/19      PT LONG TERM GOAL #4   Title  demonstrate at least 4/5 Lt shoulder strength for improved function    Status  New    Target Date  12/30/19            Plan - 11/29/19 0835    Clinical Impression Statement  Pt tolerated session well today, min cues needed today with use of mirror to work on shoulder shrug.  Will continue to benefit from PT to maximize function.    Personal Factors and Comorbidities  Comorbidity 3+;Past/Current Experience    Comorbidities  OA, HTN, prostate cancer, DM, HIV+    Examination-Activity Limitations  Bathing;Lift;Reach Overhead;Carry;Hygiene/Grooming    Examination-Participation Restrictions  Other   occupation   Stability/Clinical Decision Making  Stable/Uncomplicated    Rehab Potential  Good    PT Frequency  2x / week    PT Duration  6 weeks    PT Treatment/Interventions  ADLs/Self Care Home Management;Cryotherapy;Electrical Stimulation;Moist Heat;Therapeutic activities;Therapeutic exercise;Patient/family education;Neuromuscular re-education;Manual techniques;Vasopneumatic Device;Taping;Passive range of motion    PT Next Visit Plan  progress ROM/strengthenin, manual/modalities PRN    PT Home Exercise Plan  Access Code: [email or web address]    Consulted and Agree with Plan of Care  Patient       Patient will benefit from skilled therapeutic intervention in order to improve the following deficits and impairments:  Pain, Postural dysfunction, Decreased range of motion, Decreased strength, Impaired UE functional use, Impaired flexibility  Visit Diagnosis: Acute pain of left shoulder  Stiffness of left shoulder, not elsewhere classified  Abnormal posture  Muscle weakness (generalized)     Problem List Patient Active Problem List   Diagnosis Date Noted  . 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 Healthcare maintenance 06/03/2019  . Human  immunodeficiency virus (HIV) disease ([place]) 05/03/2019  . Type 2 diabetes mellitus with hyperglycemia, without long-term current use of insulin ([place]) 05/03/2019  . Essential hypertension 05/03/2019  . Class 1 obesity due to excess calories with serious comorbidity and body mass index (BMI) of 32.0 to 32.9 in adult 05/03/2019  . Degenerative arthritis  of shoulder region 05/21/2016  . Malignant neoplasm of prostate ([place]) 11/20/2014  . Travel advice encounter 04/01/2014      Laureen Abrahams, PT, DPT 11/29/19 8:37 AM     [facility] Physical Therapy [address] [place], [place], [address] Phone: [phone]   Fax:  [phone]  Name: William Mcfarland MRN: [id number] Date of Birth: [birth year]      PHYSICAL THERAPY DISCHARGE SUMMARY  Visits from Start of Care: 4  Current functional level related to goals / functional outcomes: See above   Remaining deficits: See above   Education / Equipment: HEP  Plan: Patient agrees to discharge.  Patient goals were not met. Patient is being discharged due to being pleased with the current functional level.  ?????    Laureen Abrahams, PT, DPT 02/21/20 8:32 AM [facility] [address] [place], [place], [address] Phone: [phone]   Fax:  [phone]

## 2019-11-30 IMAGING — CT CT SHOULDER*L* W/O CM
1 series · 12 of 14 positions shown, 15 images · non-contrast
Comparison: X-ray 04/20/2018

CLINICAL DATA: Chronic left shoulder pain

EXAM:
CT OF THE UPPER LEFT EXTREMITY WITHOUT CONTRAST
TECHNIQUE: Multidetector CT imaging of the upper left extremity was performed
according to the standard protocol.

[Series 2: shoulder 2.00 br40 s3 axial soft · axial · 0.48mm/px · z∈[-881,-709]mm · 12 of 102 slices shown, 15 images]
[im 8/102  soft-tissue]
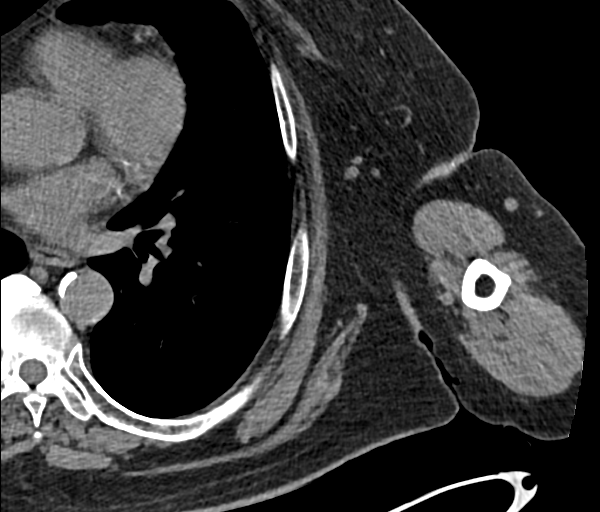
[im 8/102  bone]
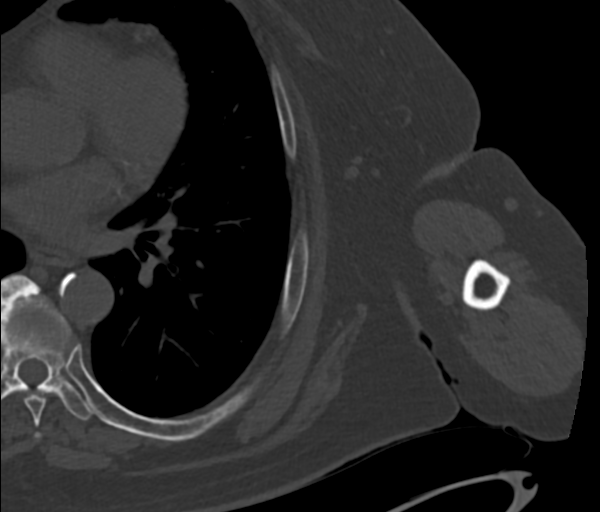
[im 16/102  bone]
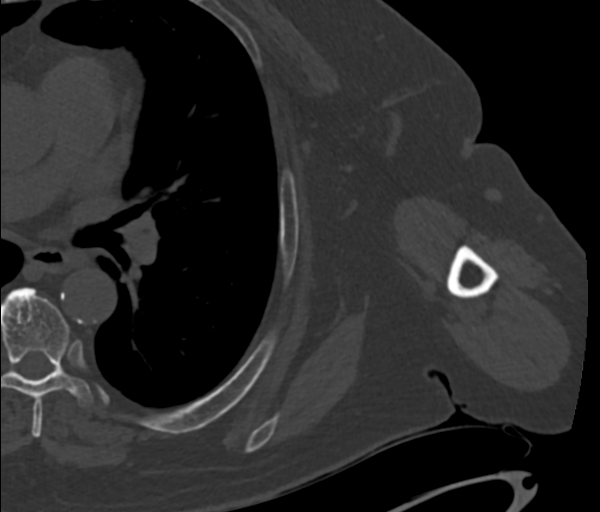
[im 24/102  bone]
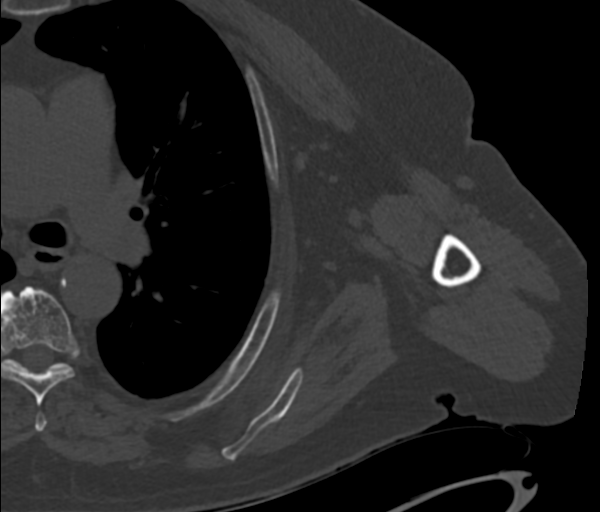
[im 32/102  bone]
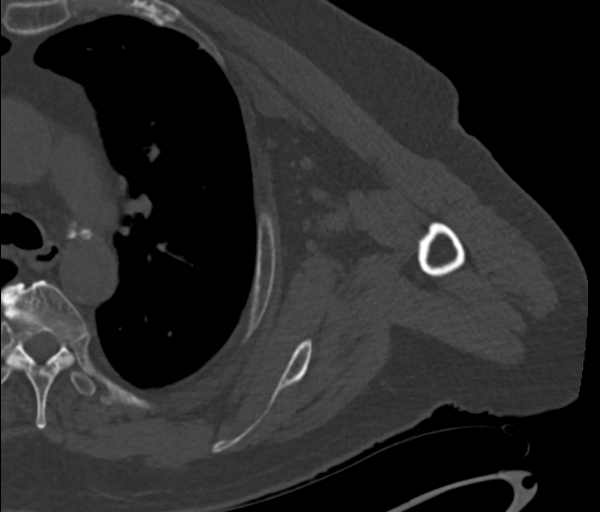
[im 39/102  soft-tissue]
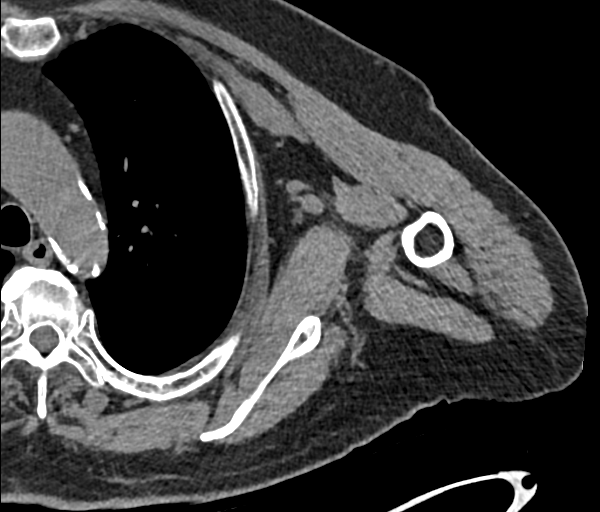
[im 39/102  bone]
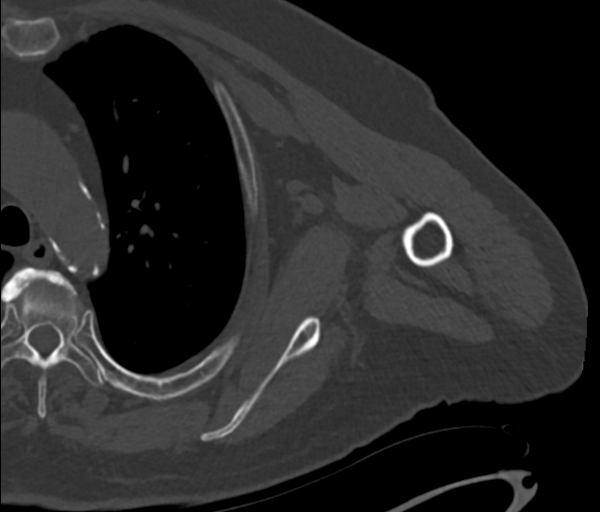
[im 47/102  bone]
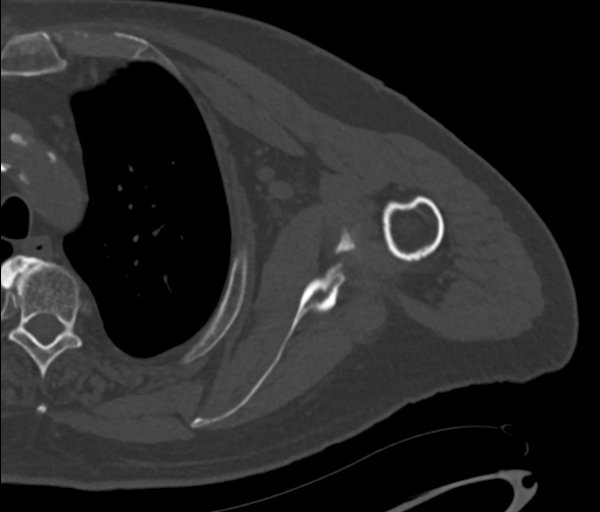
[im 55/102  bone]
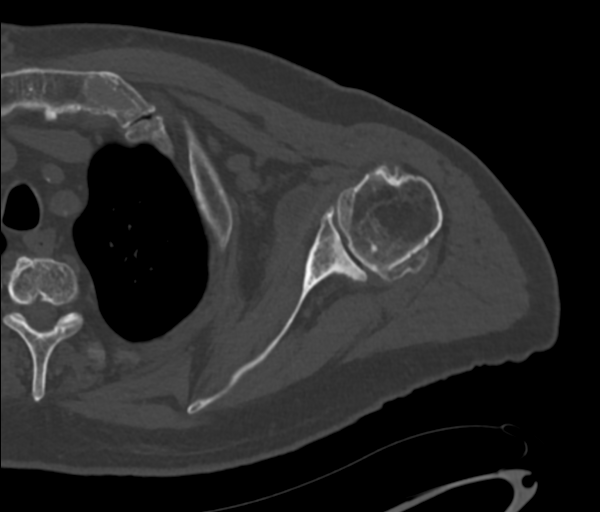
[im 63/102  bone]
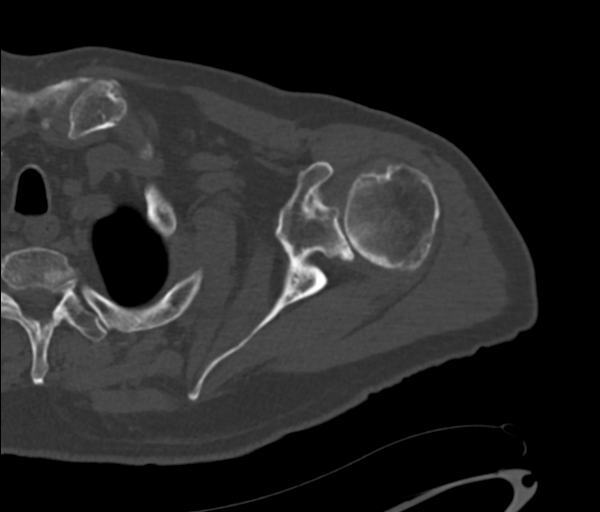
[im 70/102  soft-tissue]
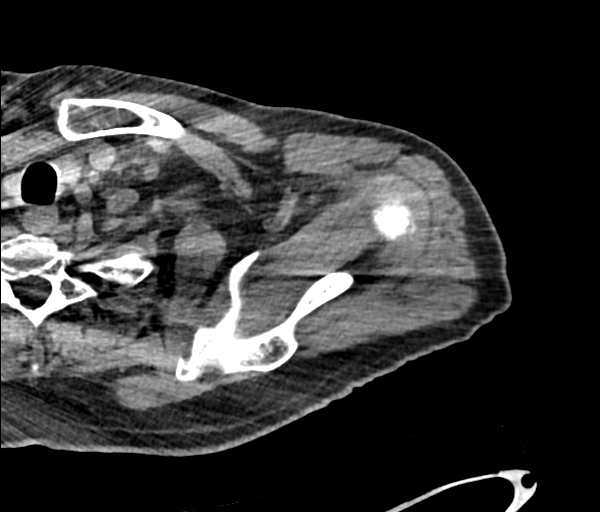
[im 70/102  bone]
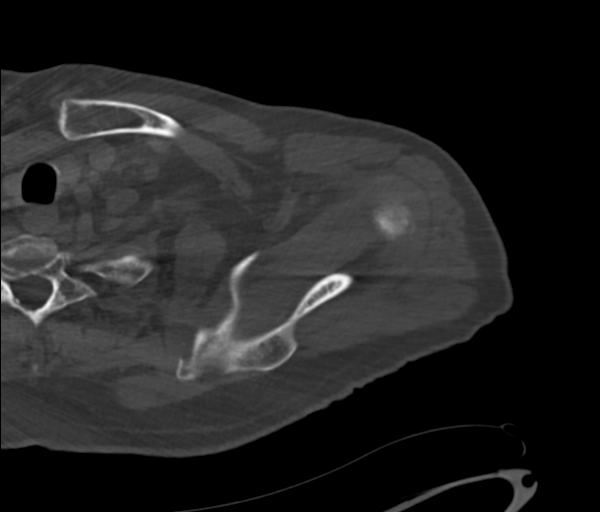
[im 78/102  bone]
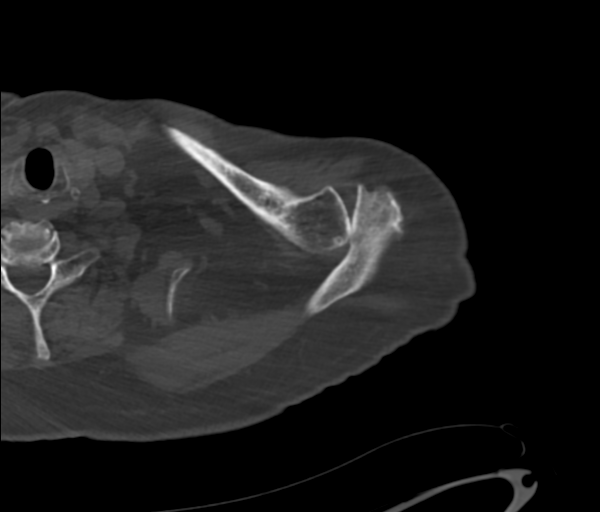
[im 86/102  bone]
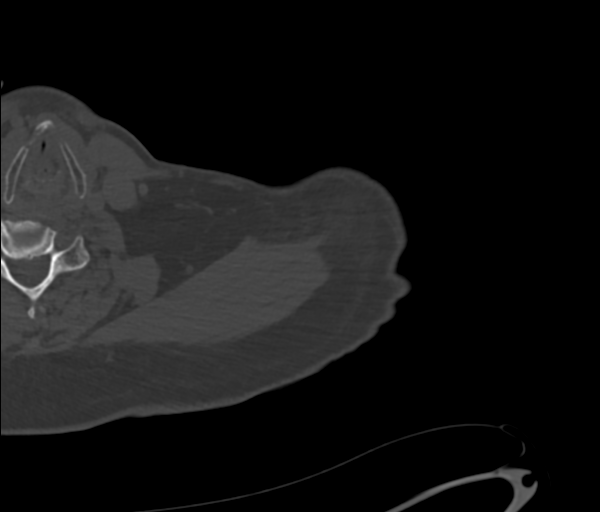
[im 94/102  bone]
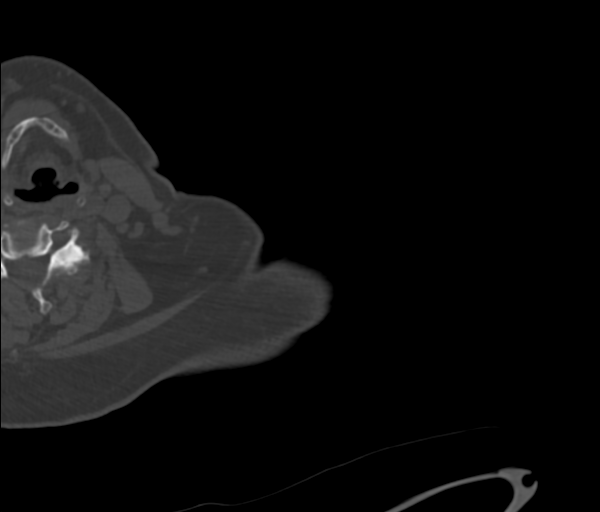

[12 of 14 positions shown; findings below may reference images not displayed]

FINDINGS: Bones/Joint/Cartilage

Severe glenohumeral joint osteoarthritis with near complete joint
space loss, subchondral sclerosis/cystic change, and large marginal
osteophyte formation. Minimal superior glenoid retroversion. Minimal
subchondral cystic change within the central glenoid. Small
glenohumeral joint effusion.

Mild degenerative change and capsular hypertrophy of the
acromioclavicular joint. No appreciable subacromial-subdeltoid
bursal fluid collection.

Degenerative changes of the visualized lower cervical spine and
thoracic spine.

Ligaments

Suboptimally assessed by CT.

Muscles and Tendons

No significant rotator cuff muscular atrophy. Rotator cuff tendons
are grossly intact within the limitations of this exam.

Soft tissues

No soft tissue fluid collection. No axillary lymphadenopathy.
Visualized portion of the left lung field is clear. Aortic
atherosclerosis. Coronary artery calcifications.
IMPRESSION: Severe glenohumeral joint osteoarthritis, as described above.

## 2019-12-03 ENCOUNTER — Encounter: Payer: BC Managed Care – PPO | Admitting: Physical Therapy

## 2019-12-06 ENCOUNTER — Encounter: Payer: BC Managed Care – PPO | Admitting: Physical Therapy

## 2019-12-07 ENCOUNTER — Encounter: Payer: BC Managed Care – PPO | Admitting: Physical Therapy

## 2019-12-14 ENCOUNTER — Encounter: Payer: BC Managed Care – PPO | Admitting: Physical Therapy

## 2019-12-16 ENCOUNTER — Encounter: Payer: BC Managed Care – PPO | Admitting: Physical Therapy

## 2019-12-22 ENCOUNTER — Ambulatory Visit (INDEPENDENT_AMBULATORY_CARE_PROVIDER_SITE_OTHER): Payer: BC Managed Care – PPO | Admitting: Orthopedic Surgery

## 2019-12-22 ENCOUNTER — Other Ambulatory Visit: Payer: Self-pay

## 2019-12-22 DIAGNOSIS — Z96612 Presence of left artificial shoulder joint: Secondary | ICD-10-CM

## 2019-12-24 ENCOUNTER — Encounter: Payer: Self-pay | Admitting: Orthopedic Surgery

## 2019-12-24 NOTE — Progress Notes (Signed)
Post-Op Visit Note   Patient: William Mcfarland           Date of Birth: 04-Jan-1956           MRN: [id number] Visit Date: 12/22/2019 PCP: Kathyrn Lass, MD   Assessment & Plan:  Chief Complaint:  Chief Complaint  Patient presents with  . Left Shoulder - Follow-up   Visit Diagnoses:  1. Status post total replacement of left shoulder     Plan: Patient is a 64 year old male who presents s/p left total shoulder arthroplasty on 10/05/2019.  Patient notes that he is doing well.  He wants to return to work.  He is not taking any medication for pain other than the occasional Tylenol.  On exam he has excellent range of motion and his incision is healing well.  He has good subscapularis strength.  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  He can self modify his work status as he did with his right shoulder.  Provided work note for patient to return to work in full capacity.  Recommended that patient call the office prior to any dental procedure for antibiotic prophylaxis.  He understands that he will follow-up with the office as needed.  Follow-Up Instructions: No follow-ups on file.   Orders:  No orders of the defined types were placed in this encounter.  No orders of the defined types were placed in this encounter.   Imaging: No results found.  PMFS History: Patient Active Problem List   Diagnosis Date Noted  . 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 Healthcare maintenance 06/03/2019  . Human immunodeficiency virus (HIV) disease ([place]) 05/03/2019  . Type 2 diabetes mellitus with hyperglycemia, without long-term current use of insulin ([place]) 05/03/2019  . Essential hypertension 05/03/2019  . Class 1 obesity due to excess calories with serious comorbidity and body mass index (BMI) of 32.0 to 32.9 in adult 05/03/2019  . Degenerative arthritis of shoulder region 05/21/2016  . Malignant neoplasm of prostate ([place]) 11/20/2014  . Travel  advice encounter 04/01/2014   Past Medical History:  Diagnosis Date  . Arthritis    shoulders, knees, hips  . At risk for sleep apnea    STOP-BANG= 5     SENT TO PCP 04-12-2015  . History of colon polyps   . History of radiation therapy 45 Gy plus seed implants on 04-13-2015   prostate external beam 02-20-2015 to 03-24-2015  . Hyperlipidemia   . Hypertension   . Lower urinary tract symptoms (LUTS)   . Prostate cancer [facility]) urologist-  dr ottelin/  oncologist- dr Tammi Klippel   T1c, Gleason 4+3,  PSA 6.83,  vol. 32.56cc--  s/p  external beam radiation 02-20-2015 to 03-24-2015  . Type 2 diabetes mellitus ([place])   . Type 2 diabetes, diet controlled ([place])   . Wears glasses     Family History  Problem Relation Age of Onset  . Stroke Father   . Stroke Mother   . Cancer Neg Hx     Past Surgical History:  Procedure Laterality Date  . COLONOSCOPY W/ POLYPECTOMY  2013  . EYE SURGERY     Cornea replacement left eye (2018)  . ORIF LEFT LEG FX  1988  . ORIF RIGHT ANKLE FX  1985  . PROSTATE BIOPSY    . RADIOACTIVE SEED IMPLANT N/A 04/14/2015   Procedure: RADIOACTIVE SEED IMPLANT;  Surgeon: Kathie Rhodes, MD;  Location: [place];  Service:  Urology;  Laterality: N/A;   64     seeds implanted no seeds found in bladder  . TOTAL SHOULDER ARTHROPLASTY Right 05/21/2016   Procedure: TOTAL SHOULDER ARTHROPLASTY;  Surgeon: Meredith Pel, MD;  Location: [place];  Service: Orthopedics;  Laterality: Right;  . TOTAL SHOULDER ARTHROPLASTY Left 10/05/2019   Procedure: LEFT Total shoulder arthroplasty;  Surgeon: Meredith Pel, MD;  Location: [place];  Service: Orthopedics;  Laterality: Left;   Social History   Occupational History  . Occupation: Air traffic controller  Tobacco Use  . Smoking status: Former Smoker    Packs/day: 0.50    Years: 15.00    Pack years: 7.50    Types: Cigarettes    Quit date: 04/12/1999    Years since quitting: 20.7  . Smokeless tobacco: Never Used    Substance and Sexual Activity  . Alcohol use: Yes    Comment: occasional  . Drug use: No  . Sexual activity: Not on file

## 2020-02-07 ENCOUNTER — Ambulatory Visit: Payer: Self-pay

## 2020-02-07 ENCOUNTER — Ambulatory Visit: Payer: BC Managed Care – PPO | Admitting: Orthopedic Surgery

## 2020-02-07 ENCOUNTER — Other Ambulatory Visit: Payer: Self-pay

## 2020-02-07 DIAGNOSIS — M722 Plantar fascial fibromatosis: Secondary | ICD-10-CM

## 2020-02-07 DIAGNOSIS — M79671 Pain in right foot: Secondary | ICD-10-CM | POA: Diagnosis not present

## 2020-02-07 MED ORDER — DICLOFENAC EPOLAMINE 1.3 % EX PTCH: 1.0000 | MEDICATED_PATCH | Freq: Two times a day (BID) | CUTANEOUS | 0 refills | Status: DC

## 2020-02-11 ENCOUNTER — Encounter: Payer: Self-pay | Admitting: Orthopedic Surgery

## 2020-02-11 NOTE — Progress Notes (Signed)
Office Visit Note   Patient: William Mcfarland           Date of Birth: 03-May-1956           MRN: [id number] Visit Date: 02/07/2020 Requested by: Kathyrn Lass, [place],  [place] [address] PCP: Kathyrn Lass, MD  Subjective: Chief Complaint  Patient presents with  . right heel pain    HPI: William Mcfarland is a 64 y.o. male who presents to the office complaining of Right plantar heel pain. Patient notes pain that began in early January 2021.  He denies any injury leading to the onset of pain.  He describes the pain as a stabbing pain in the bottom of his right heel that is worst when he gets up from bed in the morning.  It improves somewhat throughout the day but is made worse with ambulation.  It is especially worse at work as well which involves walking on concrete floors and climbing ladders.  He is taking the occasional Tylenol for this pain.  He has not tried any topicals.  He has been using an orthotic with some relief of pain.              ROS:  All systems reviewed are negative as they relate to the chief complaint within the history of present illness.  Patient denies fevers or chills.  Assessment & Plan: Visit Diagnoses:  1. Plantar fasciitis of right foot   2. Pain of right heel     Plan: Patient is a 64 year old male who presents complaining of approximately 6 weeks of right heel pain.  No injury is associated with onset.  Radiographs are negative for any fracture.  Radiographs do show a very small bone spur at the attachment of the plantar fascia to the calcaneus.  He does have tenderness over the calcaneal insertion of the plantar fascia.  Ordered Flector patch for topical use.  Patient will try this and return in 4 weeks for clinical recheck.  We discussed the option of a cortisone injection but after further discussion, patient wanted to hold off on this for now.  Also recommended that he try heel cord and plantar fascial stretching in addition to  using the Flector patch.  Follow-Up Instructions: No follow-ups on file.   Orders:  Orders Placed This Encounter  Procedures  . XR Os Calcis Right   Meds ordered this encounter  Medications  . diclofenac (FLECTOR) 1.3 % PTCH    Sig: Place 1 patch onto the skin 2 (two) times daily.    Dispense:  60 patch    Refill:  0      Procedures: No procedures performed   Clinical Data: No additional findings.  Objective: Vital Signs: There were no vitals taken for this visit.  Physical Exam:  Constitutional: Patient appears well-developed HEENT:  Head: Normocephalic Eyes:EOM are normal Neck: Normal range of motion Cardiovascular: Normal rate Pulmonary/chest: Effort normal Neurologic: Patient is alert Skin: Skin is warm Psychiatric: Patient has normal mood and affect  Ortho Exam:  Tenderness to palpation over the calcaneal insertion of the plantar fascia.  Mild tenderness to palpation throughout the plantar fascia.  No significant tenderness to palpation throughout the ankle, forefoot, retrocalcaneal space, Achilles tendon, Achilles tendon insertion, ATFL, CFL, deltoid ligament, fifth metatarsal base, Lisfranc complex.  Dorsiflexion, plantarflexion, inversion, eversion preserved actively.  Sensation intact throughout the right foot.  Specialty Comments:  No specialty comments available.  Imaging: No results found.  PMFS History: Patient Active Problem List   Diagnosis Date Noted  . 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 Healthcare maintenance 06/03/2019  . Human immunodeficiency virus (HIV) disease ([place]) 05/03/2019  . Type 2 diabetes mellitus with hyperglycemia, without long-term current use of insulin ([place]) 05/03/2019  . Essential hypertension 05/03/2019  . Class 1 obesity due to excess calories with serious comorbidity and body mass index (BMI) of 32.0 to 32.9 in adult 05/03/2019  . Degenerative arthritis of shoulder region 05/21/2016  . Malignant neoplasm of  prostate ([place]) 11/20/2014  . Travel advice encounter 04/01/2014   Past Medical History:  Diagnosis Date  . Arthritis    shoulders, knees, hips  . At risk for sleep apnea    STOP-BANG= 5     SENT TO PCP 04-12-2015  . History of colon polyps   . History of radiation therapy 45 Gy plus seed implants on 04-13-2015   prostate external beam 02-20-2015 to 03-24-2015  . Hyperlipidemia   . Hypertension   . Lower urinary tract symptoms (LUTS)   . Prostate cancer [facility]) urologist-  dr ottelin/  oncologist- dr Tammi Klippel   T1c, Gleason 4+3,  PSA 6.83,  vol. 32.56cc--  s/p  external beam radiation 02-20-2015 to 03-24-2015  . Type 2 diabetes mellitus ([place])   . Type 2 diabetes, diet controlled ([place])   . Wears glasses     Family History  Problem Relation Age of Onset  . Stroke Father   . Stroke Mother   . Cancer Neg Hx     Past Surgical History:  Procedure Laterality Date  . COLONOSCOPY W/ POLYPECTOMY  2013  . EYE SURGERY     Cornea replacement left eye (2018)  . ORIF LEFT LEG FX  1988  . ORIF RIGHT ANKLE FX  1985  . PROSTATE BIOPSY    . RADIOACTIVE SEED IMPLANT N/A 04/14/2015   Procedure: RADIOACTIVE SEED IMPLANT;  Surgeon: Kathie Rhodes, MD;  Location: [facility];  Service: Urology;  Laterality: N/A;   64     seeds implanted no seeds found in bladder  . TOTAL SHOULDER ARTHROPLASTY Right 05/21/2016   Procedure: TOTAL SHOULDER ARTHROPLASTY;  Surgeon: Meredith Pel, MD;  Location: [place];  Service: Orthopedics;  Laterality: Right;  . TOTAL SHOULDER ARTHROPLASTY Left 10/05/2019   Procedure: LEFT Total shoulder arthroplasty;  Surgeon: Meredith Pel, MD;  Location: [place];  Service: Orthopedics;  Laterality: Left;   Social History   Occupational History  . Occupation: Air traffic controller  Tobacco Use  . Smoking status: Former Smoker    Packs/day: 0.50    Years: 15.00    Pack years: 7.50    Types: Cigarettes    Quit date: 04/12/1999    Years since quitting: 20.8   . Smokeless tobacco: Never Used  Substance and Sexual Activity  . Alcohol use: Yes    Comment: occasional  . Drug use: No  . Sexual activity: Not on file

## 2020-02-14 ENCOUNTER — Other Ambulatory Visit: Payer: BC Managed Care – PPO

## 2020-02-14 ENCOUNTER — Other Ambulatory Visit: Payer: Self-pay

## 2020-02-14 DIAGNOSIS — Z113 Encounter for screening for infections with a predominantly sexual mode of transmission: Secondary | ICD-10-CM

## 2020-02-14 DIAGNOSIS — B20 Human immunodeficiency virus [HIV] disease: Secondary | ICD-10-CM

## 2020-02-14 DIAGNOSIS — Z79899 Other long term (current) drug therapy: Secondary | ICD-10-CM

## 2020-02-15 LAB — T-HELPER CELL (CD4) - (RCID CLINIC ONLY)
CD4 % Helper T Cell: 43 % (ref 33–65)
CD4 T Cell Abs: 1015 /uL (ref 400–1790)

## 2020-02-17 LAB — CBC WITH DIFFERENTIAL/PLATELET
Absolute Monocytes: 548 cells/uL (ref 200–950)
Basophils Absolute: 53 cells/uL (ref 0–200)
Basophils Relative: 0.8 %
Eosinophils Absolute: 218 cells/uL (ref 15–500)
Eosinophils Relative: 3.3 %
HCT: 42.5 % (ref 38.5–50.0)
Hemoglobin: 14.8 g/dL (ref 13.2–17.1)
Lymphs Abs: 2297 cells/uL (ref 850–3900)
MCH: 32.2 pg (ref 27.0–33.0)
MCHC: 34.8 g/dL (ref 32.0–36.0)
MCV: 92.4 fL (ref 80.0–100.0)
MPV: 9.3 fL (ref 7.5–12.5)
Monocytes Relative: 8.3 %
Neutro Abs: 3485 cells/uL (ref 1500–7800)
Neutrophils Relative %: 52.8 %
Platelets: 321 10*3/uL (ref 140–400)
RBC: 4.6 10*6/uL (ref 4.20–5.80)
RDW: 13 % (ref 11.0–15.0)
Total Lymphocyte: 34.8 %
WBC: 6.6 10*3/uL (ref 3.8–10.8)

## 2020-02-17 LAB — COMPLETE METABOLIC PANEL WITH GFR
AG Ratio: 1.8 (calc) (ref 1.0–2.5)
ALT: 18 U/L (ref 9–46)
AST: 14 U/L (ref 10–35)
Albumin: 4.2 g/dL (ref 3.6–5.1)
Alkaline phosphatase (APISO): 51 U/L (ref 35–144)
BUN: 23 mg/dL (ref 7–25)
CO2: 28 mmol/L (ref 20–32)
Calcium: 9.9 mg/dL (ref 8.6–10.3)
Chloride: 101 mmol/L (ref 98–110)
Creat: 0.84 mg/dL (ref 0.70–1.25)
GFR, Est African American: 108 mL/min/{1.73_m2} (ref >=60)
GFR, Est Non African American: 93 mL/min/{1.73_m2} (ref >=60)
Globulin: 2.4 g/dL (calc) (ref 1.9–3.7)
Glucose, Bld: 118 mg/dL — ABNORMAL HIGH (ref 65–99)
Potassium: 4.4 mmol/L (ref 3.5–5.3)
Sodium: 137 mmol/L (ref 135–146)
Total Bilirubin: 0.4 mg/dL (ref 0.2–1.2)
Total Protein: 6.6 g/dL (ref 6.1–8.1)

## 2020-02-17 LAB — HIV-1 RNA QUANT-NO REFLEX-BLD
HIV 1 RNA Quant: <20 copies/mL
HIV-1 RNA Quant, Log: <1.3 Log copies/mL

## 2020-02-17 LAB — RPR: RPR Ser Ql: NONREACTIVE

## 2020-03-06 ENCOUNTER — Encounter: Payer: Self-pay | Admitting: Family

## 2020-03-06 ENCOUNTER — Ambulatory Visit: Payer: BC Managed Care – PPO | Admitting: Family

## 2020-03-06 ENCOUNTER — Other Ambulatory Visit: Payer: Self-pay

## 2020-03-06 DIAGNOSIS — B20 Human immunodeficiency virus [HIV] disease: Secondary | ICD-10-CM

## 2020-03-06 DIAGNOSIS — I10 Essential (primary) hypertension: Secondary | ICD-10-CM | POA: Diagnosis not present

## 2020-03-06 DIAGNOSIS — Z0000 Encounter for general adult medical examination without abnormal findings: Secondary | ICD-10-CM | POA: Diagnosis not present

## 2020-03-06 MED ORDER — BICTEGRAVIR-EMTRICITAB-TENOFOV 50-200-25 MG PO TABS: 1.0000 | ORAL_TABLET | Freq: Every day | ORAL | 5 refills | Status: DC

## 2020-03-06 NOTE — Patient Instructions (Addendum)
Nice to see you.  Continue to take your Yettem as prescribed.  Refills have been sent to the pharmacy.  Due for Pneumovax at your next office visit.   We will plan for follow up in 4 months or sooner if needed with lab work 1-2 weeks prior to your appointment.   Have a great day and stay safe!

## 2020-03-06 NOTE — Assessment & Plan Note (Signed)
   Due for Pneumovax at next office visit.  Held today secondary to recent Covid vaccination.  Discussed importance of safe sexual practice to reduce risk of STI.  Condoms declined.  Encouraged to follow-up with dentistry for routine dental care.

## 2020-03-06 NOTE — Assessment & Plan Note (Signed)
Blood pressure slightly elevated above goal 140/90.  No red flags/warning symptoms present including headache or changes in vision.  Encouraged to monitor blood pressure at home as able.  Continue current dose of lisinopril-hydrochlorothiazide

## 2020-03-06 NOTE — Assessment & Plan Note (Signed)
William Mcfarland continues to have well-controlled HIV disease with good adherence and tolerance to his ART regimen of Biktarvy.  No signs/symptoms of opportunistic infection or progressive HIV disease.  We reviewed his lab work and discussed the plan of care.  Continue current dose of Biktarvy.  Plan for follow-up in 4 months or sooner if needed with lab work 1 to 2 weeks prior to appointment or on same day.

## 2020-03-06 NOTE — Progress Notes (Signed)
Subjective:    Patient ID: William Mcfarland, male    DOB: [birth year], 64 y.o.   MRN: [id number]  Chief Complaint  Patient presents with  . Follow-up    B20 Pt stated having muscle and joint pain.     HPI:  William Mcfarland is a 64 y.o. male with HIV disease who was last seen in the office on 10/18/2019 with good adherence and tolerance to his ART regimen of Biktarvy.  Blood work at the time showed a viral load that was undetectable and CD4 count of 792.  Most recent blood work completed on 02/14/2020 with viral load that remains undetectable and CD4 count of 1015.  RPR was nonreactive for syphilis.  Renal function, hepatic function, electrolytes within normal ranges.  Blood glucose was 118.  Healthcare maintenance due includes Pneumovax.  Mr. Ekdahl continues to take his Biktarvy as prescribed with no adverse side effects or missed doses.  Overall feeling well today with no new concerns/complaints.  He received his [employer] Covid vaccine on 02/24/2020. Denies fevers, chills, night sweats, headaches, changes in vision, neck pain/stiffness, nausea, diarrhea, vomiting, lesions or rashes.  Mr. Prasse has no problems obtaining his medication from the pharmacy.  Denies feelings of being down, depressed, or hopeless recently.  He has been noting himself to be more angry lately and believes that is related to stress.  No recreational or illicit drug use.  He smokes occasional cigar and drinks about 2-3 drinks per week on average.  Not currently sexually active and declines condoms.  Continues to work full-time.   No Known Allergies    Outpatient Medications Prior to Visit  Medication Sig Dispense Refill  . aspirin 81 MG chewable tablet Chew 1 tablet (81 mg total) by mouth daily. 28 tablet 0  . atorvastatin (LIPITOR) 10 MG tablet Take 10 mg by mouth daily in the afternoon.     . diclofenac (FLECTOR) 1.3 % PTCH Place 1 patch onto the skin 2 (two) times daily. 60 patch 0  .  escitalopram (LEXAPRO) 10 MG tablet Take 10 mg by mouth 4 (four) times a week.     Marland Kitchen lisinopril-hydrochlorothiazide (ZESTORETIC) 20-12.5 MG tablet Take 1 tablet by mouth 2 (two) times a day.    . metFORMIN (GLUCOPHAGE) 500 MG tablet Take 500 mg by mouth 2 (two) times daily with a meal.    . methocarbamol (ROBAXIN) 500 MG tablet Take 1 tablet (500 mg total) by mouth every 8 (eight) hours as needed for muscle spasms. 30 tablet 0  . bictegravir-emtricitabine-tenofovir AF (BIKTARVY) 50-200-25 MG TABS tablet Take 1 tablet by mouth daily. 30 tablet 5  . naproxen sodium (ALEVE) 220 MG tablet Take 440 mg by mouth 2 (two) times daily as needed (pain.).    Marland Kitchen HYDROcodone-acetaminophen (NORCO/VICODIN) 5-325 MG tablet Take 1 tablet by mouth every 12 (twelve) hours as needed for moderate pain. (Patient not taking: Reported on 03/06/2020) 35 tablet 0  . oxyCODONE (OXY IR/ROXICODONE) 5 MG immediate release tablet Take 1 tablet (5 mg total) by mouth every 12 (twelve) hours as needed for moderate pain (pain score 4-6). (Patient not taking: Reported on 03/06/2020) 30 tablet 0   No facility-administered medications prior to visit.     Past Medical History:  Diagnosis Date  . Arthritis    shoulders, knees, hips  . At risk for sleep apnea    STOP-BANG= 5     SENT TO PCP 04-12-2015  . History of colon polyps   .  History of radiation therapy 45 Gy plus seed implants on 04-13-2015   prostate external beam 02-20-2015 to 03-24-2015  . Hyperlipidemia   . Hypertension   . Lower urinary tract symptoms (LUTS)   . Prostate cancer [facility]) urologist-  dr ottelin/  oncologist- dr Tammi Klippel   T1c, Gleason 4+3,  PSA 6.83,  vol. 32.56cc--  s/p  external beam radiation 02-20-2015 to 03-24-2015  . Type 2 diabetes mellitus ([place])   . Type 2 diabetes, diet controlled ([place])   . Wears glasses      Past Surgical History:  Procedure Laterality Date  . COLONOSCOPY W/ POLYPECTOMY  2013  . EYE SURGERY     Cornea replacement left eye  (2018)  . ORIF LEFT LEG FX  1988  . ORIF RIGHT ANKLE FX  1985  . PROSTATE BIOPSY    . RADIOACTIVE SEED IMPLANT N/A 04/14/2015   Procedure: RADIOACTIVE SEED IMPLANT;  Surgeon: Kathie Rhodes, MD;  Location: [facility];  Service: Urology;  Laterality: N/A;   64     seeds implanted no seeds found in bladder  . TOTAL SHOULDER ARTHROPLASTY Right 05/21/2016   Procedure: TOTAL SHOULDER ARTHROPLASTY;  Surgeon: Meredith Pel, MD;  Location: [place];  Service: Orthopedics;  Laterality: Right;  . TOTAL SHOULDER ARTHROPLASTY Left 10/05/2019   Procedure: LEFT Total shoulder arthroplasty;  Surgeon: Meredith Pel, MD;  Location: [place];  Service: Orthopedics;  Laterality: Left;      Review of Systems  Constitutional: Negative for appetite change, chills, fatigue, fever and unexpected weight change.  Eyes: Negative for visual disturbance.  Respiratory: Negative for cough, chest tightness, shortness of breath and wheezing.   Cardiovascular: Negative for chest pain and leg swelling.  Gastrointestinal: Negative for abdominal pain, constipation, diarrhea, nausea and vomiting.  Genitourinary: Negative for dysuria, flank pain, frequency, genital sores, hematuria and urgency.  Skin: Negative for rash.  Allergic/Immunologic: Negative for immunocompromised state.  Neurological: Negative for dizziness and headaches.      Objective:    BP (!) 159/92   Pulse 75   Temp 98.3 F (36.8 C)   Ht 5' 5.5" (1.664 m)   Wt 216 lb (98 kg)   SpO2 95%   BMI 35.40 kg/m  Nursing note and vital signs reviewed.  Physical Exam Constitutional:      General: He is not in acute distress.    Appearance: He is well-developed.  Eyes:     Conjunctiva/sclera: Conjunctivae normal.  Cardiovascular:     Rate and Rhythm: Normal rate and regular rhythm.     Heart sounds: Normal heart sounds. No murmur. No friction rub. No gallop.   Pulmonary:     Effort: Pulmonary effort is normal. No respiratory distress.       Breath sounds: Normal breath sounds. No wheezing or rales.  Chest:     Chest wall: No tenderness.  Abdominal:     General: Bowel sounds are normal.     Palpations: Abdomen is soft.     Tenderness: There is no abdominal tenderness.  Musculoskeletal:     Cervical back: Neck supple.  Lymphadenopathy:     Cervical: No cervical adenopathy.  Skin:    General: Skin is warm and dry.     Findings: No rash.  Neurological:     Mental Status: He is alert and oriented to person, place, and time.  Psychiatric:        Behavior: Behavior normal.        Thought Content: Thought  content normal.        Judgment: Judgment normal.     Depression screen [facility] 2/9 10/18/2019 07/19/2019 05/03/2019 03/03/2015 02/24/2015  Decreased Interest 0 0 1 0 0  Down, Depressed, Hopeless 0 0 1 0 0  PHQ - 2 Score 0 0 2 0 0  Altered sleeping - - 1 - -  Tired, decreased energy - - 1 - -  Change in appetite - - 1 - -  Feeling bad or failure about yourself  - - 0 - -  Trouble concentrating - - 0 - -  Moving slowly or fidgety/restless - - 0 - -  Suicidal thoughts - - 0 - -  PHQ-9 Score - - 5 - -       Assessment & Plan:    Patient Active Problem List   Diagnosis Date Noted  . 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 Healthcare maintenance 06/03/2019  . Human immunodeficiency virus (HIV) disease ([place]) 05/03/2019  . Type 2 diabetes mellitus with hyperglycemia, without long-term current use of insulin ([place]) 05/03/2019  . Essential hypertension 05/03/2019  . Class 1 obesity due to excess calories with serious comorbidity and body mass index (BMI) of 32.0 to 32.9 in adult 05/03/2019  . Degenerative arthritis of shoulder region 05/21/2016  . Malignant neoplasm of prostate ([place]) 11/20/2014  . Travel advice encounter 04/01/2014     Problem List Items Addressed This Visit      Cardiovascular and Mediastinum   Essential hypertension    Blood pressure slightly elevated above goal 140/90.  No red flags/warning  symptoms present including headache or changes in vision.  Encouraged to monitor blood pressure at home as able.  Continue current dose of lisinopril-hydrochlorothiazide        Other   Human immunodeficiency virus (HIV) disease ([place])    Mr. Seminario continues to have well-controlled HIV disease with good adherence and tolerance to his ART regimen of Biktarvy.  No signs/symptoms of opportunistic infection or progressive HIV disease.  We reviewed his lab work and discussed the plan of care.  Continue current dose of Biktarvy.  Plan for follow-up in 4 months or sooner if needed with lab work 1 to 2 weeks prior to appointment or on same day.      Relevant Medications   bictegravir-emtricitabine-tenofovir AF (BIKTARVY) 50-200-25 MG TABS tablet   Other Relevant Orders   T-helper cell (CD4)- ([facility] clinic only)   HIV-1 RNA quant-no reflex-bld   Comprehensive metabolic panel   Healthcare maintenance     Due for Pneumovax at next office visit.  Held today secondary to recent Covid vaccination.  Discussed importance of safe sexual practice to reduce risk of STI.  Condoms declined.  Encouraged to follow-up with dentistry for routine dental care.          I have discontinued Sharon Mt. Ezzell's oxyCODONE and HYDROcodone-acetaminophen. I am also having him maintain his escitalopram, lisinopril-hydrochlorothiazide, atorvastatin, metFORMIN, naproxen sodium, aspirin, methocarbamol, diclofenac, and bictegravir-emtricitabine-tenofovir AF.   Meds ordered this encounter  Medications  . bictegravir-emtricitabine-tenofovir AF (BIKTARVY) 50-200-25 MG TABS tablet    Sig: Take 1 tablet by mouth daily.    Dispense:  30 tablet    Refill:  5    Order Specific Question:   Supervising Provider    Answer:   Carlyle Basques [[id number]]     Follow-up: Return in about 4 months (around 07/06/2020), or if symptoms worsen or fail to improve.   Terri Piedra, MSN, FNP-C Nurse Practitioner [facility]  for  Infectious Disease [place] number: [phone]

## 2020-03-29 ENCOUNTER — Other Ambulatory Visit: Payer: Self-pay

## 2020-03-29 ENCOUNTER — Ambulatory Visit: Payer: BC Managed Care – PPO | Admitting: Orthopedic Surgery

## 2020-03-29 ENCOUNTER — Ambulatory Visit: Payer: Self-pay

## 2020-03-29 ENCOUNTER — Encounter: Payer: Self-pay | Admitting: Orthopedic Surgery

## 2020-03-29 DIAGNOSIS — M1612 Unilateral primary osteoarthritis, left hip: Secondary | ICD-10-CM

## 2020-03-29 DIAGNOSIS — M25552 Pain in left hip: Secondary | ICD-10-CM

## 2020-03-29 DIAGNOSIS — M25562 Pain in left knee: Secondary | ICD-10-CM | POA: Diagnosis not present

## 2020-03-31 ENCOUNTER — Encounter: Payer: Self-pay | Admitting: Orthopedic Surgery

## 2020-03-31 MED ORDER — CELECOXIB 100 MG PO CAPS: 100.0000 mg | ORAL_CAPSULE | Freq: Two times a day (BID) | ORAL | 0 refills | Status: DC

## 2020-03-31 NOTE — Progress Notes (Signed)
Office Visit Note   Patient: William Mcfarland           Date of Birth: 1956/04/28           MRN: [id number] Visit Date: 03/29/2020 Requested by: Kathyrn Lass, [place],  [place] [id number] PCP: Kathyrn Lass, MD  Subjective: Chief Complaint  Patient presents with  . Left Hip - Pain  . Left Knee - Pain    HPI: William Mcfarland is a 64 y.o. male who presents to the office complaining of left hip pain.  Patient notes pain for several years that is worse in the last 2 to 3 months.  Denies any injury.  Localizes pain to the left groin with radiation down the proximal anterior thigh.  Pain is worse with hip flexion and with a sending stairs.  Worse at the end of the day.  Occasional radiation to knee but no significant knee pain.  Denies any numbness or tingling to the leg.  Denies any radicular pain coming from his back.  He has been taking ibuprofen.  He has noticed that he has more difficulty rotating his hip to put his shoes and socks on compared with the right hip..                ROS:  All systems reviewed are negative as they relate to the chief complaint within the history of present illness.  Patient denies fevers or chills.  Assessment & Plan: Visit Diagnoses:  1. Unilateral primary osteoarthritis, left hip   2. Pain in left hip   3. Left knee pain, unspecified chronicity     Plan: Patient is a 64 year old male who presents complaining of left groin pain.  Pain is been present for several years but has been worse in the last couple months.  He has difficulty with hip flexion and ascending stairs.  Increased pain on exam with internal rotation/external rotation of the left hip joint and he has decreased rotation range of motion of the left hip joint compared with contralateral side.  .  Radiographs of the left hip reveal moderate hip osteoarthritis present on the left and not present on the right.  We will switch patient to Celebrex from ibuprofen as he is  taking quite a bit of ibuprofen.  Also referred patient to Dr. Laurence Spates for fluoroscopy guided left hip injection of cortisone.  Patient will follow up as needed.  Follow-Up Instructions: No follow-ups on file.   Orders:  Orders Placed This Encounter  Procedures  . XR HIP UNILAT W OR W/O PELVIS 2-3 VIEWS LEFT  . XR KNEE 3 VIEW LEFT  . Ambulatory referral to Physical Medicine Rehab   No orders of the defined types were placed in this encounter.     Procedures: No procedures performed   Clinical Data: No additional findings.  Objective: Vital Signs: There were no vitals taken for this visit.  Physical Exam:  Constitutional: Patient appears well-developed HEENT:  Head: Normocephalic Eyes:EOM are normal Neck: Normal range of motion Cardiovascular: Normal rate Pulmonary/chest: Effort normal Neurologic: Patient is alert Skin: Skin is warm Psychiatric: Patient has normal mood and affect  Ortho Exam:  Hip exam Decreased internal rotation/external rotation range of motion of the left hip compared to contralateral side Increased pain with terminal internal rotation of the left hip.  Positive Stinchfield exam of the left hip.  No knee effusion is present.  No significant tenderness to palpation throughout the left knee.  Negative straight leg raise.  Specialty Comments:  No specialty comments available.  Imaging: No results found.   PMFS History: Patient Active Problem List   Diagnosis Date Noted  . 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 Healthcare maintenance 06/03/2019  . Human immunodeficiency virus (HIV) disease ([place]) 05/03/2019  . Type 2 diabetes mellitus with hyperglycemia, without long-term current use of insulin ([place]) 05/03/2019  . Essential hypertension 05/03/2019  . Class 1 obesity due to excess calories with serious comorbidity and body mass index (BMI) of 32.0 to 32.9 in adult 05/03/2019  . Degenerative arthritis of shoulder region 05/21/2016  .  Malignant neoplasm of prostate ([place]) 11/20/2014  . Travel advice encounter 04/01/2014   Past Medical History:  Diagnosis Date  . Arthritis    shoulders, knees, hips  . At risk for sleep apnea    STOP-BANG= 5     SENT TO PCP 04-12-2015  . History of colon polyps   . History of radiation therapy 45 Gy plus seed implants on 04-13-2015   prostate external beam 02-20-2015 to 03-24-2015  . Hyperlipidemia   . Hypertension   . Lower urinary tract symptoms (LUTS)   . Prostate cancer [facility]) urologist-  dr ottelin/  oncologist- dr Tammi Klippel   T1c, Gleason 4+3,  PSA 6.83,  vol. 32.56cc--  s/p  external beam radiation 02-20-2015 to 03-24-2015  . Type 2 diabetes mellitus ([place])   . Type 2 diabetes, diet controlled ([place])   . Wears glasses     Family History  Problem Relation Age of Onset  . Stroke Father   . Stroke Mother   . Cancer Neg Hx     Past Surgical History:  Procedure Laterality Date  . COLONOSCOPY W/ POLYPECTOMY  2013  . EYE SURGERY     Cornea replacement left eye (2018)  . ORIF LEFT LEG FX  1988  . ORIF RIGHT ANKLE FX  1985  . PROSTATE BIOPSY    . RADIOACTIVE SEED IMPLANT N/A 04/14/2015   Procedure: RADIOACTIVE SEED IMPLANT;  Surgeon: Kathie Rhodes, MD;  Location: [facility];  Service: Urology;  Laterality: N/A;   64     seeds implanted no seeds found in bladder  . TOTAL SHOULDER ARTHROPLASTY Right 05/21/2016   Procedure: TOTAL SHOULDER ARTHROPLASTY;  Surgeon: Meredith Pel, MD;  Location: [place];  Service: Orthopedics;  Laterality: Right;  . TOTAL SHOULDER ARTHROPLASTY Left 10/05/2019   Procedure: LEFT Total shoulder arthroplasty;  Surgeon: Meredith Pel, MD;  Location: [place];  Service: Orthopedics;  Laterality: Left;   Social History   Occupational History  . Occupation: Air traffic controller  Tobacco Use  . Smoking status: Former Smoker    Packs/day: 0.50    Years: 15.00    Pack years: 7.50    Types: Cigarettes    Quit date: 04/12/1999    Years  since quitting: 20.9  . Smokeless tobacco: Never Used  Substance and Sexual Activity  . Alcohol use: Yes    Comment: occasional  . Drug use: No  . Sexual activity: Not on file

## 2020-04-20 ENCOUNTER — Other Ambulatory Visit: Payer: Self-pay

## 2020-04-20 ENCOUNTER — Ambulatory Visit: Payer: Self-pay

## 2020-04-20 ENCOUNTER — Encounter: Payer: Self-pay | Admitting: Physical Medicine and Rehabilitation

## 2020-04-20 ENCOUNTER — Ambulatory Visit: Payer: BC Managed Care – PPO | Admitting: Physical Medicine and Rehabilitation

## 2020-04-20 DIAGNOSIS — M25552 Pain in left hip: Secondary | ICD-10-CM

## 2020-04-20 NOTE — Progress Notes (Signed)
Pt states pain in left hip (groin pain). Pt states pain started years ago. Bending on the left side that makes pain worse. Pt states ice packs helps with pain.   .Numeric Pain Rating Scale and Functional Assessment Average Pain 5   In the last MONTH (on 0-10 scale) has pain interfered with the following?  1. General activity like being  able to carry out your everyday physical activities such as walking, climbing stairs, carrying groceries, or moving a chair?  Rating(6)   -Dye Allergies.

## 2020-04-20 NOTE — Progress Notes (Signed)
   MOMEN GROSSO - 64 y.o. male MRN [id number]  Date of birth: 1956-06-11  Office Visit Note: Visit Date: 04/20/2020 PCP: Kathyrn Lass, MD Referred by: Kathyrn Lass, MD  Subjective: Chief Complaint  Patient presents with  . Left Hip - Pain   HPI:  MALACKI VENIER is a 64 y.o. male who comes in today At the request of Dr. Jean Rosenthal for diagnostic and hopefully therapeutic anesthetic hip arthrogram on the left.  Patient is having concordant left hip and groin pain with referral to the knee.  ROS Otherwise per HPI.  Assessment & Plan: Visit Diagnoses:  1. Pain in left hip     Plan: No additional findings.   Meds & Orders: No orders of the defined types were placed in this encounter.   Orders Placed This Encounter  Procedures  . Large Joint Inj: L hip joint  . XR C-ARM NO REPORT    Follow-up: Return for visit to requesting physician as needed.   Procedures: Large Joint Inj: L hip joint on 04/20/2020 2:54 PM Indications: pain and diagnostic evaluation Details: 22 G needle, anterior approach  Arthrogram: Yes  Medications: 4 mL bupivacaine 0.25 %; 60 mg triamcinolone acetonide 40 MG/ML Outcome: tolerated well, no immediate complications  Arthrogram demonstrated excellent flow of contrast throughout the joint surface without extravasation or obvious defect.  The patient had relief of symptoms during the anesthetic phase of the injection.  Procedure, treatment alternatives, risks and benefits explained, specific risks discussed. Consent was given by the patient. Immediately prior to procedure a time out was called to verify the correct patient, procedure, equipment, support staff and site/side marked as required. Patient was prepped and draped in the usual sterile fashion.      No notes on file   Clinical History: No specialty comments available.     Objective:  VS:  HT:    WT:   BMI:     BP:   HR: bpm  TEMP: ( )  RESP:  Physical Exam    Imaging: XR C-ARM NO REPORT  Result Date: 04/20/2020 Please see Notes tab for imaging impression.

## 2020-04-21 MED ORDER — TRIAMCINOLONE ACETONIDE 40 MG/ML IJ SUSP
60.0000 mg | INTRAMUSCULAR | Status: AC | PRN
  Administered 2020-04-20: 15:00:00 60 mg via INTRA_ARTICULAR

## 2020-04-21 MED ORDER — BUPIVACAINE HCL 0.25 % IJ SOLN
4.0000 mL | INTRAMUSCULAR | Status: AC | PRN
  Administered 2020-04-20: 15:00:00 4 mL via INTRA_ARTICULAR

## 2020-04-27 ENCOUNTER — Telehealth: Payer: Self-pay | Admitting: Orthopedic Surgery

## 2020-04-27 NOTE — Telephone Encounter (Signed)
Patient called, requesting xrays of hip & knee. Please call when ready  [phone]

## 2020-04-27 NOTE — Telephone Encounter (Signed)
Called patient and advised that CD was ready for pickup at front desk.  °

## 2020-05-22 ENCOUNTER — Telehealth: Payer: Self-pay | Admitting: Orthopedic Surgery

## 2020-05-22 NOTE — Telephone Encounter (Signed)
Note generated and faxed.  °

## 2020-05-22 NOTE — Telephone Encounter (Signed)
Jamayka from [facility] called stating that the patient has an dentist appointment and also had shoulder surgery in 2017.  She is needing the protocol faxed over to her at [phone].  CB#[phone].  Thank you.

## 2020-06-14 ENCOUNTER — Other Ambulatory Visit: Payer: Self-pay

## 2020-06-14 ENCOUNTER — Other Ambulatory Visit: Payer: BC Managed Care – PPO

## 2020-06-14 DIAGNOSIS — B20 Human immunodeficiency virus [HIV] disease: Secondary | ICD-10-CM

## 2020-06-15 LAB — T-HELPER CELL (CD4) - (RCID CLINIC ONLY)
CD4 % Helper T Cell: 36 % (ref 33–65)
CD4 T Cell Abs: 593 /uL (ref 400–1790)

## 2020-06-17 LAB — COMPREHENSIVE METABOLIC PANEL
AG Ratio: 1.7 (calc) (ref 1.0–2.5)
ALT: 20 U/L (ref 9–46)
AST: 14 U/L (ref 10–35)
Albumin: 4.5 g/dL (ref 3.6–5.1)
Alkaline phosphatase (APISO): 65 U/L (ref 35–144)
BUN: 19 mg/dL (ref 7–25)
CO2: 27 mmol/L (ref 20–32)
Calcium: 10 mg/dL (ref 8.6–10.3)
Chloride: 97 mmol/L — ABNORMAL LOW (ref 98–110)
Creat: 0.94 mg/dL (ref 0.70–1.25)
Globulin: 2.7 g/dL (calc) (ref 1.9–3.7)
Glucose, Bld: 210 mg/dL — ABNORMAL HIGH (ref 65–99)
Potassium: 4.2 mmol/L (ref 3.5–5.3)
Sodium: 135 mmol/L (ref 135–146)
Total Bilirubin: 0.6 mg/dL (ref 0.2–1.2)
Total Protein: 7.2 g/dL (ref 6.1–8.1)

## 2020-06-17 LAB — HIV-1 RNA QUANT-NO REFLEX-BLD
HIV 1 RNA Quant: <20 copies/mL
HIV-1 RNA Quant, Log: <1.3 Log copies/mL

## 2020-07-03 ENCOUNTER — Encounter: Payer: Self-pay | Admitting: Family

## 2020-07-03 ENCOUNTER — Ambulatory Visit: Payer: BC Managed Care – PPO | Admitting: Family

## 2020-07-03 ENCOUNTER — Other Ambulatory Visit: Payer: Self-pay

## 2020-07-03 VITALS — BP 156/79 | HR 82 | Temp 97.6°F | Wt 216.0 lb

## 2020-07-03 DIAGNOSIS — M109 Gout, unspecified: Secondary | ICD-10-CM | POA: Insufficient documentation

## 2020-07-03 DIAGNOSIS — Z0000 Encounter for general adult medical examination without abnormal findings: Secondary | ICD-10-CM | POA: Diagnosis not present

## 2020-07-03 DIAGNOSIS — Z23 Encounter for immunization: Secondary | ICD-10-CM

## 2020-07-03 DIAGNOSIS — I10 Essential (primary) hypertension: Secondary | ICD-10-CM | POA: Diagnosis not present

## 2020-07-03 DIAGNOSIS — Z113 Encounter for screening for infections with a predominantly sexual mode of transmission: Secondary | ICD-10-CM

## 2020-07-03 DIAGNOSIS — B20 Human immunodeficiency virus [HIV] disease: Secondary | ICD-10-CM

## 2020-07-03 DIAGNOSIS — M10071 Idiopathic gout, right ankle and foot: Secondary | ICD-10-CM

## 2020-07-03 MED ORDER — BICTEGRAVIR-EMTRICITAB-TENOFOV 50-200-25 MG PO TABS: 1.0000 | ORAL_TABLET | Freq: Every day | ORAL | 5 refills | Status: DC

## 2020-07-03 MED ORDER — PREDNISONE 10 MG (21) PO TBPK
ORAL_TABLET | ORAL | 0 refills | Status: DC
Start: 2020-07-03 — End: 2020-12-25

## 2020-07-03 NOTE — Progress Notes (Signed)
Subjective:    Patient ID: William Mcfarland, male    DOB: January 12, 1956, 64 y.o.   MRN: [id number]  Chief Complaint  Patient presents with   Follow-up    B20. Patient complains of having a possible gout flare up.     HPI:  RAYNALD Mcfarland is a 64 y.o. male with HIV disease was last seen in the office on 03/06/2020 with good adherence and tolerance to his ART regimen of Biktarvy.  Viral load at the time was undetectable with CD4 count 1015.  Most recent blood work completed on 06/14/2020 with continued viral suppression with a viral load that remains undetectable and CD4 count of 593.  Healthcare maintenance due includes Pneumovax and tetanus.  William Mcfarland continues to take his Biktarvy daily as prescribed with no adverse side effects and possibly 1-2 missed doses since his last office visit.  Overall feeling well today.  Has had concern for new onset pain within the last 24 hours in his right great toe from previous gout flares.  This is similar to his previous gout flares and uses prednisone to overcome/treat. Denies fevers, chills, night sweats, headaches, changes in vision, neck pain/stiffness, nausea, diarrhea, vomiting, lesions or rashes.  William Mcfarland has no problems obtaining his medication from the pharmacy and remains covered through [facility].  Has had feelings of being down recently but denies being depressed or hopeless.  No recreational or illicit drug use or tobacco use.  Drinks alcohol on occasion.  Dental screening is up-to-date per recommendations.  Declines condoms today.  No Known Allergies    Outpatient Medications Prior to Visit  Medication Sig Dispense Refill   aspirin 81 MG chewable tablet Chew 1 tablet (81 mg total) by mouth daily. 28 tablet 0   atorvastatin (LIPITOR) 10 MG tablet Take 10 mg by mouth daily in the afternoon.      celecoxib (CELEBREX) 100 MG capsule Take 1 capsule (100 mg total) by mouth 2 (two) times daily. 60 capsule 0    diclofenac (FLECTOR) 1.3 % PTCH Place 1 patch onto the skin 2 (two) times daily. 60 patch 0   lisinopril-hydrochlorothiazide (ZESTORETIC) 20-12.5 MG tablet Take 1 tablet by mouth 2 (two) times a day.     metFORMIN (GLUCOPHAGE) 500 MG tablet Take 500 mg by mouth 2 (two) times daily with a meal.     methocarbamol (ROBAXIN) 500 MG tablet Take 1 tablet (500 mg total) by mouth every 8 (eight) hours as needed for muscle spasms. 30 tablet 0   naproxen sodium (ALEVE) 220 MG tablet Take 440 mg by mouth 2 (two) times daily as needed (pain.).     bictegravir-emtricitabine-tenofovir AF (BIKTARVY) 50-200-25 MG TABS tablet Take 1 tablet by mouth daily. 30 tablet 5   escitalopram (LEXAPRO) 10 MG tablet Take 10 mg by mouth 4 (four) times a week.      No facility-administered medications prior to visit.     Past Medical History:  Diagnosis Date   Arthritis    shoulders, knees, hips   At risk for sleep apnea    STOP-BANG= 5     SENT TO PCP 04-12-2015   History of colon polyps    History of radiation therapy 45 Gy plus seed implants on 04-13-2015   prostate external beam 02-20-2015 to 03-24-2015   Hyperlipidemia    Hypertension    Lower urinary tract symptoms (LUTS)    Prostate cancer [facility]) urologist-  dr ottelin/  oncologist- dr Tammi Klippel   T1c,  Gleason 4+3,  PSA 6.83,  vol. 32.56cc--  s/p  external beam radiation 02-20-2015 to 03-24-2015   Type 2 diabetes mellitus ([place])    Type 2 diabetes, diet controlled ([place])    Wears glasses      Past Surgical History:  Procedure Laterality Date   COLONOSCOPY W/ POLYPECTOMY  2013   EYE SURGERY     Cornea replacement left eye (2018)   ORIF LEFT LEG FX  1988   ORIF RIGHT ANKLE FX  1985   PROSTATE BIOPSY     RADIOACTIVE SEED IMPLANT N/A 04/14/2015   Procedure: RADIOACTIVE SEED IMPLANT;  Surgeon: Kathie Rhodes, MD;  Location: [place];  Service: Urology;  Laterality: N/A;   64     seeds implanted no seeds found in bladder     TOTAL SHOULDER ARTHROPLASTY Right 05/21/2016   Procedure: TOTAL SHOULDER ARTHROPLASTY;  Surgeon: Meredith Pel, MD;  Location: [place];  Service: Orthopedics;  Laterality: Right;   TOTAL SHOULDER ARTHROPLASTY Left 10/05/2019   Procedure: LEFT Total shoulder arthroplasty;  Surgeon: Meredith Pel, MD;  Location: [place];  Service: Orthopedics;  Laterality: Left;       Review of Systems  Constitutional: Negative for chills, diaphoresis, fatigue and fever.  Respiratory: Negative for cough, chest tightness, shortness of breath and wheezing.   Cardiovascular: Negative for chest pain.  Gastrointestinal: Negative for abdominal pain, diarrhea, nausea and vomiting.  Musculoskeletal:       Positive for right toe pain      Objective:    BP (!) 156/79    Pulse 82    Temp 97.6 F (36.4 C) (Oral)    Wt 216 lb (98 kg)    BMI 35.40 kg/m  Nursing note and vital signs reviewed.  Physical Exam Constitutional:      General: He is not in acute distress.    Appearance: He is well-developed.  Eyes:     Conjunctiva/sclera: Conjunctivae normal.  Cardiovascular:     Rate and Rhythm: Normal rate and regular rhythm.     Heart sounds: Normal heart sounds. No murmur heard.  No friction rub. No gallop.   Pulmonary:     Effort: Pulmonary effort is normal. No respiratory distress.     Breath sounds: Normal breath sounds. No wheezing or rales.  Chest:     Chest wall: No tenderness.  Abdominal:     General: Bowel sounds are normal.     Palpations: Abdomen is soft.     Tenderness: There is no abdominal tenderness.  Musculoskeletal:     Cervical back: Neck supple.  Lymphadenopathy:     Cervical: No cervical adenopathy.  Skin:    General: Skin is warm and dry.     Findings: No rash.  Neurological:     Mental Status: He is alert and oriented to person, place, and time.  Psychiatric:        Behavior: Behavior normal.        Thought Content: Thought content normal.        Judgment: Judgment  normal.      Depression screen [facility] 2/9 07/03/2020 10/18/2019 07/19/2019 05/03/2019 03/03/2015  Decreased Interest 0 0 0 1 0  Down, Depressed, Hopeless 0 0 0 1 0  PHQ - 2 Score 0 0 0 2 0  Altered sleeping - - - 1 -  Tired, decreased energy - - - 1 -  Change in appetite - - - 1 -  Feeling bad or failure about yourself  - - -  0 -  Trouble concentrating - - - 0 -  Moving slowly or fidgety/restless - - - 0 -  Suicidal thoughts - - - 0 -  PHQ-9 Score - - - 5 -       Assessment & Plan:    Patient Active Problem List   Diagnosis Date Noted   OA (osteoarthritis) of shoulder 10/05/2019   Healthcare maintenance 06/03/2019   Human immunodeficiency virus (HIV) disease ([place]) 05/03/2019   Type 2 diabetes mellitus with hyperglycemia, without long-term current use of insulin ([place]) 05/03/2019   Essential hypertension 05/03/2019   Class 1 obesity due to excess calories with serious comorbidity and body mass index (BMI) of 32.0 to 32.9 in adult 05/03/2019   Degenerative arthritis of shoulder region 05/21/2016   Malignant neoplasm of prostate ([place]) 11/20/2014   Travel advice encounter 04/01/2014     Problem List Items Addressed This Visit      Cardiovascular and Mediastinum   Essential hypertension    Blood pressure slightly elevated today above goal 140/90 with current medication regimen.  Continue current dose of lisinopril-hydrochlorothiazide.  Encouraged to monitor blood pressure at home and monitor overall trend.  If increasing may need additional medication.  Continue management per primary care.        Other   Human immunodeficiency virus (HIV) disease ([place])    Mr. Saephan continues to have well-controlled HIV disease with good adherence and tolerance to his ART regimen of Biktarvy.  No signs/symptoms of opportunistic infection or progressive HIV disease.  Has no problems obtaining medication from the pharmacy.  Reviewed lab work and discussed plan of care.  Continue current dose of  Biktarvy.  Plan for follow-up in 4 months or sooner if needed with lab work 1 to 2 weeks prior to appointment.      Relevant Medications   bictegravir-emtricitabine-tenofovir AF (BIKTARVY) 50-200-25 MG TABS tablet   Healthcare maintenance     Tetanus and Pneumovax updated today.  Dental screening up-to-date per recommendations.  Discussed importance of safe sexual practice to reduce risk of STI.          I have discontinued Mamie Diiorio. Slowey's escitalopram. I am also having him start on predniSONE. Additionally, I am having him maintain his lisinopril-hydrochlorothiazide, atorvastatin, metFORMIN, naproxen sodium, aspirin, methocarbamol, diclofenac, celecoxib, and bictegravir-emtricitabine-tenofovir AF.   Meds ordered this encounter  Medications   predniSONE (STERAPRED UNI-PAK 21 TAB) 10 MG (21) TBPK tablet    Sig: Take according to package directions    Dispense:  21 tablet    Refill:  0    Order Specific Question:   Supervising Provider    Answer:   Baxter Flattery, CYNTHIA [[id number]]   bictegravir-emtricitabine-tenofovir AF (BIKTARVY) 50-200-25 MG TABS tablet    Sig: Take 1 tablet by mouth daily.    Dispense:  30 tablet    Refill:  5    Order Specific Question:   Supervising Provider    Answer:   Carlyle Basques [[id number]]     Follow-up: Return in about 4 months (around 11/03/2020), or if symptoms worsen or fail to improve.   Terri Piedra, MSN, FNP-C Nurse Practitioner [facility] for [facility]ious [facility] [place] number: [phone]

## 2020-07-03 NOTE — Assessment & Plan Note (Signed)
Mr. Fluckiger continues to have well-controlled HIV disease with good adherence and tolerance to his ART regimen of Biktarvy.  No signs/symptoms of opportunistic infection or progressive HIV disease.  Has no problems obtaining medication from the pharmacy.  Reviewed lab work and discussed plan of care.  Continue current dose of Biktarvy.  Plan for follow-up in 4 months or sooner if needed with lab work 1 to 2 weeks prior to appointment.

## 2020-07-03 NOTE — Assessment & Plan Note (Signed)
   Tetanus and Pneumovax updated today.  Dental screening up-to-date per recommendations.  Discussed importance of safe sexual practice to reduce risk of STI.

## 2020-07-03 NOTE — Assessment & Plan Note (Signed)
William Mcfarland has signs and symptoms consistent with gout flare. Start prednisone. Recommended to monitor blood sugars while taking prednisone. Follow up with primary care if symptoms worsen or do not improve.

## 2020-07-03 NOTE — Assessment & Plan Note (Signed)
Blood pressure slightly elevated today above goal 140/90 with current medication regimen.  Continue current dose of lisinopril-hydrochlorothiazide.  Encouraged to monitor blood pressure at home and monitor overall trend.  If increasing may need additional medication.  Continue management per primary care.

## 2020-07-03 NOTE — Patient Instructions (Signed)
Nice to see you.  Refills of medication of been sent to the pharmacy.  Continue to take your William Mcfarland daily.  Plan for follow-up in 4 months or sooner if needed with lab work 1 to 2 weeks prior to your appointment.  Have a great day and stay safe!

## 2020-07-13 ENCOUNTER — Ambulatory Visit: Payer: BC Managed Care – PPO | Admitting: Orthopedic Surgery

## 2020-07-13 ENCOUNTER — Encounter: Payer: Self-pay | Admitting: Orthopedic Surgery

## 2020-07-13 ENCOUNTER — Ambulatory Visit: Payer: Self-pay

## 2020-07-13 VITALS — Ht 65.25 in | Wt 212.0 lb

## 2020-07-13 DIAGNOSIS — M25562 Pain in left knee: Secondary | ICD-10-CM | POA: Diagnosis not present

## 2020-07-13 DIAGNOSIS — M1712 Unilateral primary osteoarthritis, left knee: Secondary | ICD-10-CM | POA: Diagnosis not present

## 2020-07-13 MED ORDER — BUPIVACAINE HCL 0.25 % IJ SOLN
4.0000 mL | INTRAMUSCULAR | Status: AC | PRN
  Administered 2020-07-13: 4 mL via INTRA_ARTICULAR

## 2020-07-13 MED ORDER — METHYLPREDNISOLONE ACETATE 40 MG/ML IJ SUSP
40.0000 mg | INTRAMUSCULAR | Status: AC | PRN
  Administered 2020-07-13: 40 mg via INTRA_ARTICULAR

## 2020-07-13 MED ORDER — LIDOCAINE HCL 1 % IJ SOLN
5.0000 mL | INTRAMUSCULAR | Status: AC | PRN
  Administered 2020-07-13: 5 mL

## 2020-07-13 NOTE — Progress Notes (Signed)
Office Visit Note   Patient: William Mcfarland           Date of Birth: [birth year]           MRN: [id number] Visit Date: 07/13/2020 Requested by: Kathyrn Lass, [place],  [place] [address] PCP: Kathyrn Lass, MD  Subjective: Chief Complaint  Patient presents with  . Left Knee - Pain    HPI: William Mcfarland is a 64 year old patient with left knee pain.  He is doing well from his left hip injection with Dr. Ernestina Patches several weeks ago.  Describes pain from the tibial tubercle extending distally.  Does not report much back pain and the pain does not sound radicular in nature.  He does have a history of complex tibial fracture treated with intramedullary nail back in the 80s.  Attempt was made at nail removal in 1993 but that was unsuccessful.  Patient describes 3-week history of left lower leg pain and mild knee pain.  Denies any mechanical symptoms.  Takes over-the-counter medication with mild relief.  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  Denies any fevers or chills.              ROS: All systems reviewed are negative as they relate to the chief complaint within the history of present illness.  Patient denies  fevers or chills.   Assessment & Plan: Visit Diagnoses:  1. Left knee pain, unspecified chronicity     Plan: Impression is left knee pain with moderate arthritis but potentially symptomatic hardware just below the tibial tubercle.  No evidence clinically or radiographically of infection around this region.  I think this nail would be exceedingly difficult to remove.  Bone is likely grown across the interlocking screw holes.  1 attempt has been made at removal.  If the patient does desire removal I would favor referral to Dr. Marcelino Scot or Dr. Doreatha Martin.  Regarding the knee I think that is possible that this could be symptomatic for him in terms of the arthritis present.  Intra-articular injection performed today.  He will let us know in a week what he  wants to do regarding his knee.  He would like to get as much maintenance type work done on his body prior to his retirement in March.  Follow-Up Instructions: Return if symptoms worsen or fail to improve.   Orders:  Orders Placed This Encounter  Procedures  . XR Knee 1-2 Views Left   No orders of the defined types were placed in this encounter.     Procedures: Large Joint Inj: L knee on 07/13/2020 1:25 PM Indications: diagnostic evaluation, joint swelling and pain Details: 18 G 1.5 in needle, superolateral approach  Arthrogram: No  Medications: 5 mL lidocaine 1 %; 40 mg methylPREDNISolone acetate 40 MG/ML; 4 mL bupivacaine 0.25 % Outcome: tolerated well, no immediate complications Procedure, treatment alternatives, risks and benefits explained, specific risks discussed. Consent was given by the patient. Immediately prior to procedure a time out was called to verify the correct patient, procedure, equipment, support staff and site/side marked as required. Patient was prepped and draped in the usual sterile fashion.       Clinical Data: No additional findings.  Objective: Vital Signs: Ht 5' 5.25" (1.657 m)   Wt (!) 212 lb (96.2 kg)   BMI 35.01 kg/m   Physical Exam:   Constitutional: Patient appears well-developed HEENT:  Head: Normocephalic Eyes:EOM are normal Neck: Normal range of  motion Cardiovascular: Normal rate Pulmonary/chest: Effort normal Neurologic: Patient is alert Skin: Skin is warm Psychiatric: Patient has normal mood and affect    Ortho Exam: Ortho exam demonstrates normal gait alignment.  Does have some groin pain on the left with internal and external rotation but it is much less than it was prior to the injection.  Mild tenderness at the tibial tubercle near that intramedullary nail site.  Collaterals are stable.  Extensor mechanism is intact.  Minimal effusion in that left knee.  Extensor mechanism is palpable and intact.  Specialty Comments:  No  specialty comments available.  Imaging: XR Knee 1-2 Views Left  Result Date: 07/13/2020 AP lateral left knee reviewed.  Tibial intramedullary nail present with the tip of the nail just above the tibial tubercle.  Fracture is not visible.  Moderate arthritis present in the medial lateral patellofemoral compartments of the left knee.  Alignment intact.  No acute fracture.    PMFS History: Patient Active Problem List   Diagnosis Date Noted  . Gout 07/03/2020  . 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 Healthcare maintenance 06/03/2019  . Human immunodeficiency virus (HIV) disease ([place]) 05/03/2019  . Type 2 diabetes mellitus with hyperglycemia, without long-term current use of insulin ([place]) 05/03/2019  . Essential hypertension 05/03/2019  . Class 1 obesity due to excess calories with serious comorbidity and body mass index (BMI) of 32.0 to 32.9 in adult 05/03/2019  . Degenerative arthritis of shoulder region 05/21/2016  . Malignant neoplasm of prostate ([place]) 11/20/2014  . Travel advice encounter 04/01/2014   Past Medical History:  Diagnosis Date  . Arthritis    shoulders, knees, hips  . At risk for sleep apnea    STOP-BANG= 5     SENT TO PCP 04-12-2015  . History of colon polyps   . History of radiation therapy 45 Gy plus seed implants on 04-13-2015   prostate external beam 02-20-2015 to 03-24-2015  . Hyperlipidemia   . Hypertension   . Lower urinary tract symptoms (LUTS)   . Prostate cancer [facility]) urologist-  dr ottelin/  oncologist- dr Tammi Klippel   T1c, Gleason 4+3,  PSA 6.83,  vol. 32.56cc--  s/p  external beam radiation 02-20-2015 to 03-24-2015  . Type 2 diabetes mellitus ([place])   . Type 2 diabetes, diet controlled ([place])   . Wears glasses     Family History  Problem Relation Age of Onset  . Stroke Father   . Stroke Mother   . Cancer Neg Hx     Past Surgical History:  Procedure Laterality Date  . COLONOSCOPY W/ POLYPECTOMY  2013  . EYE SURGERY     Cornea replacement  left eye (2018)  . ORIF LEFT LEG FX  1988  . ORIF RIGHT ANKLE FX  1985  . PROSTATE BIOPSY    . RADIOACTIVE SEED IMPLANT N/A 04/14/2015   Procedure: RADIOACTIVE SEED IMPLANT;  Surgeon: Kathie Rhodes, MD;  Location: [facility];  Service: Urology;  Laterality: N/A;   64     seeds implanted no seeds found in bladder  . TOTAL SHOULDER ARTHROPLASTY Right 05/21/2016   Procedure: TOTAL SHOULDER ARTHROPLASTY;  Surgeon: Meredith Pel, MD;  Location: [place];  Service: Orthopedics;  Laterality: Right;  . TOTAL SHOULDER ARTHROPLASTY Left 10/05/2019   Procedure: LEFT Total shoulder arthroplasty;  Surgeon: Meredith Pel, MD;  Location: [place];  Service: Orthopedics;  Laterality: Left;   Social History   Occupational History  . Occupation: Air traffic controller  Tobacco Use  . Smoking status: Former Smoker    Packs/day: 0.50    Years: 15.00    Pack years: 7.50    Types: Cigarettes    Quit date: 04/12/1999    Years since quitting: 21.2  . Smokeless tobacco: Never Used  Vaping Use  . Vaping Use: Never used  Substance and Sexual Activity  . Alcohol use: Yes    Comment: occasional  . Drug use: No  . Sexual activity: Not on file

## 2020-08-17 ENCOUNTER — Ambulatory Visit: Payer: BC Managed Care – PPO | Admitting: Orthopedic Surgery

## 2020-08-17 DIAGNOSIS — M1712 Unilateral primary osteoarthritis, left knee: Secondary | ICD-10-CM

## 2020-08-17 DIAGNOSIS — M25562 Pain in left knee: Secondary | ICD-10-CM | POA: Diagnosis not present

## 2020-08-18 ENCOUNTER — Encounter: Payer: Self-pay | Admitting: Orthopedic Surgery

## 2020-08-18 NOTE — Progress Notes (Signed)
Office Visit Note   Patient: William Mcfarland           Date of Birth: [birth year]           MRN: [id number] Visit Date: 08/17/2020 Requested by: Kathyrn Lass, [place],  [place] [address] PCP: Kathyrn Lass, MD  Subjective: Chief Complaint  Patient presents with   Left Knee - Pain    HPI: William Mcfarland is a 64 y.o. male who presents to the office complaining of left knee pain.  Patient returns following cortisone injection into the left knee on 07/13/2020.  He notes 50% relief of pain for about 7 to 10 days.  He does note continued pain that he describes as a "throbbing toothache".  Localizes majority of the pain to the anterior proximal tibia.  Pain gets worse throughout the day.  He notes his knee occasionally gives out on him.  He has been taking Tylenol and occasional Celebrex for pain relief.  Pain is worse than it was at his last office visit.  Pain does occasionally wake him up at night.  Due to this pain he states that it is too difficult to do his job so he is considering disability..                ROS: All systems reviewed are negative as they relate to the chief complaint within the history of present illness.  Patient denies fevers or chills.  Assessment & Plan: Visit Diagnoses:  1. Left knee pain, unspecified chronicity     Plan: Patient is a 64 year old male who presents complaining of left knee and proximal tibial pain.  Injection in the knee gave marginal relief only.  He has had increasing pain in the last several months.  He has a history of intramedullary nail placement decades ago.  There has been one attempt at hardware removal that was unsuccessful in the 90s.  He does have moderate left knee arthritis.  Most of his pain is localized to the anterior tibial shaft where the intramedullary rod is prominent on lateral radiographs.  This is likely a combination of pain from the hardware as well as pain from his arthritis.  He did have some  relief for short period of time with cortisone injection into the left knee.  However this relief is fleeting.  The majority of his pain on exam and by his history seems to be coming from the painful hardware.  Plan to refer patient to Dr. Helane Gunther for evaluation for potential hardware removal.  Follow-Up Instructions: No follow-ups on file.   Orders:  Orders Placed This Encounter  Procedures   Ambulatory referral to Orthopedic Surgery   No orders of the defined types were placed in this encounter.     Procedures: No procedures performed   Clinical Data: No additional findings.  Objective: Vital Signs: There were no vitals taken for this visit.  Physical Exam:  Constitutional: Patient appears well-developed HEENT:  Head: Normocephalic Eyes:EOM are normal Neck: Normal range of motion Cardiovascular: Normal rate Pulmonary/chest: Effort normal Neurologic: Patient is alert Skin: Skin is warm Psychiatric: Patient has normal mood and affect  Ortho Exam: Ortho exam demonstrates tenderness over the anterior proximal tibial shaft.  No significant effusion is present today.  Note joint line tenderness is present.  Extensor mechanism intact in the left knee.  No pain with internal rotation/external rotation of the hip.  No calf tenderness on exam.  Negative Homans' sign.  Specialty  Comments:  No specialty comments available.  Imaging: No results found.   PMFS History: Patient Active Problem List   Diagnosis Date Noted   Gout 07/03/2020   OA (osteoarthritis) of shoulder 10/05/2019   Healthcare maintenance 06/03/2019   Human immunodeficiency virus (HIV) disease ([place]) 05/03/2019   Type 2 diabetes mellitus with hyperglycemia, without long-term current use of insulin ([place]) 05/03/2019   Essential hypertension 05/03/2019   Class 1 obesity due to excess calories with serious comorbidity and body mass index (BMI) of 32.0 to 32.9 in adult 05/03/2019   Degenerative  arthritis of shoulder region 05/21/2016   Malignant neoplasm of prostate ([place]) 11/20/2014   Travel advice encounter 04/01/2014   Past Medical History:  Diagnosis Date   Arthritis    shoulders, knees, hips   At risk for sleep apnea    STOP-BANG= 5     SENT TO PCP 04-12-2015   History of colon polyps    History of radiation therapy 45 Gy plus seed implants on 04-13-2015   prostate external beam 02-20-2015 to 03-24-2015   Hyperlipidemia    Hypertension    Lower urinary tract symptoms (LUTS)    Prostate cancer [facility]) urologist-  dr ottelin/  oncologist- dr Tammi Klippel   T1c, Gleason 4+3,  PSA 6.83,  vol. 32.56cc--  s/p  external beam radiation 02-20-2015 to 03-24-2015   Type 2 diabetes mellitus ([place])    Type 2 diabetes, diet controlled ([place])    Wears glasses     Family History  Problem Relation Age of Onset   Stroke Father    Stroke Mother    Cancer Neg Hx     Past Surgical History:  Procedure Laterality Date   COLONOSCOPY W/ POLYPECTOMY  2013   EYE SURGERY     Cornea replacement left eye (2018)   ORIF LEFT LEG FX  1988   ORIF RIGHT ANKLE FX  1985   PROSTATE BIOPSY     RADIOACTIVE SEED IMPLANT N/A 04/14/2015   Procedure: RADIOACTIVE SEED IMPLANT;  Surgeon: Kathie Rhodes, MD;  Location: [place];  Service: Urology;  Laterality: N/A;   64     seeds implanted no seeds found in bladder   TOTAL SHOULDER ARTHROPLASTY Right 05/21/2016   Procedure: TOTAL SHOULDER ARTHROPLASTY;  Surgeon: Meredith Pel, MD;  Location: [place];  Service: Orthopedics;  Laterality: Right;   TOTAL SHOULDER ARTHROPLASTY Left 10/05/2019   Procedure: LEFT Total shoulder arthroplasty;  Surgeon: Meredith Pel, MD;  Location: [place];  Service: Orthopedics;  Laterality: Left;   Social History   Occupational History   Occupation: Air traffic controller  Tobacco Use   Smoking status: Former Smoker    Packs/day: 0.50    Years: 15.00    Pack years: 7.50    Types:  Cigarettes    Quit date: 04/12/1999    Years since quitting: 21.3   Smokeless tobacco: Never Used  Vaping Use   Vaping Use: Never used  Substance and Sexual Activity   Alcohol use: Yes    Comment: occasional   Drug use: No   Sexual activity: Not on file

## 2020-08-24 ENCOUNTER — Telehealth: Payer: Self-pay | Admitting: Orthopedic Surgery

## 2020-08-24 NOTE — Telephone Encounter (Signed)
Please advise. See below. We referred to Dr Marcelino Scot but they do not accept his insurance.

## 2020-08-24 NOTE — Telephone Encounter (Signed)
Patient called.  He needs a new referral. The facility he was originally sent to does not accept [place] [employer] [place]  Call back: [phone]

## 2020-08-29 ENCOUNTER — Telehealth: Payer: Self-pay | Admitting: Surgical

## 2020-08-29 NOTE — Telephone Encounter (Signed)
Please advise. Thanks.  

## 2020-08-29 NOTE — Telephone Encounter (Signed)
Patient called needed Rx refilled Celecoxib. Patient uses [facility]  Listed in chart. The number to contact patient is [phone]

## 2020-08-30 ENCOUNTER — Other Ambulatory Visit: Payer: Self-pay | Admitting: Surgical

## 2020-08-30 ENCOUNTER — Telehealth: Payer: Self-pay | Admitting: Orthopedic Surgery

## 2020-08-30 MED ORDER — CELECOXIB 100 MG PO CAPS
100.0000 mg | ORAL_CAPSULE | Freq: Two times a day (BID) | ORAL | 2 refills | Status: DC
Start: 2020-08-30 — End: 2020-11-24

## 2020-08-30 MED ORDER — CELECOXIB 100 MG PO CAPS: 100.0000 mg | ORAL_CAPSULE | Freq: Two times a day (BID) | ORAL | 0 refills | Status: DC

## 2020-08-30 NOTE — Telephone Encounter (Signed)
I called patient. No answer. LMVM for him with details for him to reach out to Dr Carlean Jews office to see if they could still see him since they had referral information. If they need [place] to refax we can, asked him to let us know. Please advise on the celecoxib refill.

## 2020-08-30 NOTE — Telephone Encounter (Signed)
Sounds good thanks for calling him.  Snet in refill

## 2020-08-30 NOTE — Telephone Encounter (Signed)
Sent in RX

## 2020-08-30 NOTE — Telephone Encounter (Signed)
Patient called asked if Dr Marlou Sa would refer him back to Dr Altamese Playita. Patient said Dr. Marcelino Scot was out of network for him but, he want to see him anyway.  Patient also asked if he can get a refill on Rx Celecoxib The number to contact patient is [phone]

## 2020-09-05 NOTE — Telephone Encounter (Signed)
Does he want to try a different city? I do not know that anyone outside of [place] that really have a good handle on how to get this thing out especially if someone is tried it before.

## 2020-10-02 ENCOUNTER — Other Ambulatory Visit: Payer: BC Managed Care – PPO

## 2020-10-02 ENCOUNTER — Other Ambulatory Visit: Payer: Self-pay

## 2020-10-02 DIAGNOSIS — B20 Human immunodeficiency virus [HIV] disease: Secondary | ICD-10-CM

## 2020-10-02 DIAGNOSIS — Z113 Encounter for screening for infections with a predominantly sexual mode of transmission: Secondary | ICD-10-CM

## 2020-10-03 LAB — T-HELPER CELL (CD4) - (RCID CLINIC ONLY)
CD4 % Helper T Cell: 46 % (ref 33–65)
CD4 T Cell Abs: 1145 /uL (ref 400–1790)

## 2020-10-04 LAB — HIV-1 RNA QUANT-NO REFLEX-BLD
HIV 1 RNA Quant: <20 Copies/mL — ABNORMAL HIGH
HIV-1 RNA Quant, Log: <1.3 Log cps/mL — ABNORMAL HIGH

## 2020-10-04 LAB — COMPREHENSIVE METABOLIC PANEL
AG Ratio: 1.6 (calc) (ref 1.0–2.5)
ALT: 24 U/L (ref 9–46)
AST: 14 U/L (ref 10–35)
Albumin: 4.6 g/dL (ref 3.6–5.1)
Alkaline phosphatase (APISO): 66 U/L (ref 35–144)
BUN: 16 mg/dL (ref 7–25)
CO2: 27 mmol/L (ref 20–32)
Calcium: 10.5 mg/dL — ABNORMAL HIGH (ref 8.6–10.3)
Chloride: 97 mmol/L — ABNORMAL LOW (ref 98–110)
Creat: 0.79 mg/dL (ref 0.70–1.25)
Globulin: 2.8 g/dL (calc) (ref 1.9–3.7)
Glucose, Bld: 167 mg/dL — ABNORMAL HIGH (ref 65–99)
Potassium: 4.4 mmol/L (ref 3.5–5.3)
Sodium: 137 mmol/L (ref 135–146)
Total Bilirubin: 0.4 mg/dL (ref 0.2–1.2)
Total Protein: 7.4 g/dL (ref 6.1–8.1)

## 2020-10-04 LAB — RPR: RPR Ser Ql: NONREACTIVE

## 2020-10-17 ENCOUNTER — Ambulatory Visit: Payer: BC Managed Care – PPO | Admitting: Family

## 2020-10-17 ENCOUNTER — Encounter: Payer: Self-pay | Admitting: Family

## 2020-10-17 ENCOUNTER — Other Ambulatory Visit: Payer: Self-pay

## 2020-10-17 VITALS — BP 135/85 | HR 90 | Temp 97.9°F | Ht 65.0 in | Wt 213.0 lb

## 2020-10-17 DIAGNOSIS — I10 Essential (primary) hypertension: Secondary | ICD-10-CM

## 2020-10-17 DIAGNOSIS — Z0000 Encounter for general adult medical examination without abnormal findings: Secondary | ICD-10-CM | POA: Diagnosis not present

## 2020-10-17 DIAGNOSIS — Z79899 Other long term (current) drug therapy: Secondary | ICD-10-CM | POA: Diagnosis not present

## 2020-10-17 DIAGNOSIS — B20 Human immunodeficiency virus [HIV] disease: Secondary | ICD-10-CM

## 2020-10-17 MED ORDER — BICTEGRAVIR-EMTRICITAB-TENOFOV 50-200-25 MG PO TABS: 1.0000 | ORAL_TABLET | Freq: Every day | ORAL | 5 refills | Status: DC

## 2020-10-17 NOTE — Assessment & Plan Note (Signed)
Blood pressure adequately controlled today below goal 140/90.  No current signs/symptoms concerning for intracranial hemorrhage or end organ damage.  Continue current dose of lisinopril-hydrochlorothiazide.

## 2020-10-17 NOTE — Assessment & Plan Note (Signed)
   Influenza up-to-date per recommendations.  He will schedule his booster dose for COVID vaccine.  Routine dental care scheduled for next month.  Discussed importance of safe sexual practice to reduce risk of STI.  Condoms declined.

## 2020-10-17 NOTE — Progress Notes (Signed)
Subjective:    Patient ID: William Mcfarland, male    DOB: 1956-11-08, 64 y.o.   MRN: [id number]  Chief Complaint  Patient presents with  . Follow-up    declined condoms      HPI:  William Mcfarland is a 64 y.o. male with HIV disease was last seen in the office on 07/03/2020 with good adherence and tolerance to his ART regimen of Biktarvy.  Viral load at the time was undetectable with CD4 count of 593.  Most recent blood work completed on 10/02/2020 with a viral load remains undetectable and CD4 count of 1145.  RPR was nonreactive for syphilis.  Kidney function, liver function, electrolytes within normal ranges.  Due for influenza vaccination.  Here today for routine follow-up.  William Mcfarland continues to take his Biktarvy daily as prescribed with no adverse side effects or missed doses since the last office visit.  Overall feeling well today.  Planning on having knee replacement surgery after the first of the year.  Has been feeling kind of down recently but denies any suicidal ideations. Denies fevers, chills, night sweats, headaches, changes in vision, neck pain/stiffness, nausea, diarrhea, vomiting, lesions or rashes.  William Mcfarland has no problems obtaining his medication from the pharmacy remains covered through [facility].  No recreational or illicit drug use or tobacco use.  Alcohol consumption is occasional.  Declines condoms.  Routine dental care scheduled for next month.  Planning on getting booster Covid vaccine with Moderna.  He has received his flu shot this year already.  No Known Allergies   Outpatient Medications Prior to Visit  Medication Sig Dispense Refill  . Aspirin-Acetaminophen-Caffeine (GOODY HEADACHE PO) Take by mouth.    Marland Kitchen atorvastatin (LIPITOR) 10 MG tablet Take 10 mg by mouth daily in the afternoon.     . celecoxib (CELEBREX) 100 MG capsule Take 1 capsule (100 mg total) by mouth 2 (two) times daily. 60 capsule 2  . lisinopril-hydrochlorothiazide  (ZESTORETIC) 20-12.5 MG tablet Take 1 tablet by mouth 2 (two) times a day.    . metFORMIN (GLUCOPHAGE) 500 MG tablet Take 500 mg by mouth 2 (two) times daily with a meal.    . naproxen sodium (ALEVE) 220 MG tablet Take 440 mg by mouth 2 (two) times daily as needed (pain.).    Marland Kitchen bictegravir-emtricitabine-tenofovir AF (BIKTARVY) 50-200-25 MG TABS tablet Take 1 tablet by mouth daily. 30 tablet 5  . aspirin 81 MG chewable tablet Chew 1 tablet (81 mg total) by mouth daily. (Patient not taking: Reported on 10/17/2020) 28 tablet 0  . diclofenac (FLECTOR) 1.3 % PTCH Place 1 patch onto the skin 2 (two) times daily. (Patient not taking: Reported on 10/17/2020) 60 patch 0  . methocarbamol (ROBAXIN) 500 MG tablet Take 1 tablet (500 mg total) by mouth every 8 (eight) hours as needed for muscle spasms. (Patient not taking: Reported on 10/17/2020) 30 tablet 0  . predniSONE (STERAPRED UNI-PAK 21 TAB) 10 MG (21) TBPK tablet Take according to package directions (Patient not taking: Reported on 10/17/2020) 21 tablet 0   No facility-administered medications prior to visit.     Past Medical History:  Diagnosis Date  . Arthritis    shoulders, knees, hips  . At risk for sleep apnea    STOP-BANG= 5     SENT TO PCP 04-12-2015  . History of colon polyps   . History of radiation therapy 45 Gy plus seed implants on 04-13-2015   prostate external beam 02-20-2015 to  03-24-2015  . Hyperlipidemia   . Hypertension   . Lower urinary tract symptoms (LUTS)   . Prostate cancer [facility]) urologist-  dr ottelin/  oncologist- dr Tammi Klippel   T1c, Gleason 4+3,  PSA 6.83,  vol. 32.56cc--  s/p  external beam radiation 02-20-2015 to 03-24-2015  . Type 2 diabetes mellitus ([place])   . Type 2 diabetes, diet controlled ([place])   . Wears glasses      Past Surgical History:  Procedure Laterality Date  . COLONOSCOPY W/ POLYPECTOMY  2013  . EYE SURGERY     Cornea replacement left eye (2018)  . ORIF LEFT LEG FX  1988  . ORIF RIGHT ANKLE FX   1985  . PROSTATE BIOPSY    . RADIOACTIVE SEED IMPLANT N/A 04/14/2015   Procedure: RADIOACTIVE SEED IMPLANT;  Surgeon: Kathie Rhodes, MD;  Location: [facility];  Service: Urology;  Laterality: N/A;   64     seeds implanted no seeds found in bladder  . TOTAL SHOULDER ARTHROPLASTY Right 05/21/2016   Procedure: TOTAL SHOULDER ARTHROPLASTY;  Surgeon: Meredith Pel, MD;  Location: [place];  Service: Orthopedics;  Laterality: Right;  . TOTAL SHOULDER ARTHROPLASTY Left 10/05/2019   Procedure: LEFT Total shoulder arthroplasty;  Surgeon: Meredith Pel, MD;  Location: [place];  Service: Orthopedics;  Laterality: Left;       Review of Systems  Constitutional: Negative for appetite change, chills, fatigue, fever and unexpected weight change.  Eyes: Negative for visual disturbance.  Respiratory: Negative for cough, chest tightness, shortness of breath and wheezing.   Cardiovascular: Negative for chest pain and leg swelling.  Gastrointestinal: Negative for abdominal pain, constipation, diarrhea, nausea and vomiting.  Genitourinary: Negative for dysuria, flank pain, frequency, genital sores, hematuria and urgency.  Skin: Negative for rash.  Allergic/Immunologic: Negative for immunocompromised state.  Neurological: Negative for dizziness and headaches.      Objective:    BP 135/85   Pulse 90   Temp 97.9 F (36.6 C) (Oral)   Ht 5\' 5"  (1.651 m)   Wt 213 lb (96.6 kg)   SpO2 97%   BMI 35.45 kg/m  Nursing note and vital signs reviewed.  Physical Exam Constitutional:      General: He is not in acute distress.    Appearance: He is well-developed.  Eyes:     Conjunctiva/sclera: Conjunctivae normal.  Cardiovascular:     Rate and Rhythm: Normal rate and regular rhythm.     Heart sounds: Normal heart sounds. No murmur heard.  No friction rub. No gallop.   Pulmonary:     Effort: Pulmonary effort is normal. No respiratory distress.     Breath sounds: Normal breath sounds. No  wheezing or rales.  Chest:     Chest wall: No tenderness.  Abdominal:     General: Bowel sounds are normal.     Palpations: Abdomen is soft.     Tenderness: There is no abdominal tenderness.  Musculoskeletal:     Cervical back: Neck supple.  Lymphadenopathy:     Cervical: No cervical adenopathy.  Skin:    General: Skin is warm and dry.     Findings: No rash.  Neurological:     Mental Status: He is alert and oriented to person, place, and time.  Psychiatric:        Behavior: Behavior normal.        Thought Content: Thought content normal.        Judgment: Judgment normal.  Depression screen [facility] 2/9 10/17/2020 07/03/2020 10/18/2019 07/19/2019 05/03/2019  Decreased Interest 0 0 0 0 1  Down, Depressed, Hopeless 1 0 0 0 1  PHQ - 2 Score 1 0 0 0 2  Altered sleeping - - - - 1  Tired, decreased energy - - - - 1  Change in appetite - - - - 1  Feeling bad or failure about yourself  - - - - 0  Trouble concentrating - - - - 0  Moving slowly or fidgety/restless - - - - 0  Suicidal thoughts - - - - 0  PHQ-9 Score - - - - 5       Assessment & Plan:    Patient Active Problem List   Diagnosis Date Noted  . Gout 07/03/2020  . 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 Healthcare maintenance 06/03/2019  . Human immunodeficiency virus (HIV) disease ([place]) 05/03/2019  . Type 2 diabetes mellitus with hyperglycemia, without long-term current use of insulin ([place]) 05/03/2019  . Essential hypertension 05/03/2019  . Class 1 obesity due to excess calories with serious comorbidity and body mass index (BMI) of 32.0 to 32.9 in adult 05/03/2019  . Degenerative arthritis of shoulder region 05/21/2016  . Malignant neoplasm of prostate ([place]) 11/20/2014  . Travel advice encounter 04/01/2014     Problem List Items Addressed This Visit      Cardiovascular and Mediastinum   Essential hypertension    Blood pressure adequately controlled today below goal 140/90.  No current signs/symptoms  concerning for intracranial hemorrhage or end organ damage.  Continue current dose of lisinopril-hydrochlorothiazide.        Other   Human immunodeficiency virus (HIV) disease ([place]) - Primary    William Mcfarland continues to have well-controlled HIV disease with good adherence and tolerance to his ART regimen of Biktarvy.  No signs/symptoms of opportunistic infection or progressive HIV disease.  We reviewed lab work and discussed plan of care.  Continue current dose of Biktarvy.  Plan for follow-up in 4 months or sooner if needed with lab work 1 to 2 weeks prior to appointment.      Relevant Medications   bictegravir-emtricitabine-tenofovir AF (BIKTARVY) 50-200-25 MG TABS tablet   Other Relevant Orders   T-helper cell (CD4)- ([facility] clinic only)   HIV-1 RNA quant-no reflex-bld   Comprehensive metabolic panel   Healthcare maintenance     Influenza up-to-date per recommendations.  He will schedule his booster dose for COVID vaccine.  Routine dental care scheduled for next month.  Discussed importance of safe sexual practice to reduce risk of STI.  Condoms declined.       Other Visit Diagnoses    Pharmacologic therapy       Relevant Orders   Lipid panel       I am having William Mcfarland maintain his lisinopril-hydrochlorothiazide, atorvastatin, metFORMIN, naproxen sodium, aspirin, methocarbamol, diclofenac, predniSONE, celecoxib, Aspirin-Acetaminophen-Caffeine (GOODY HEADACHE PO), and bictegravir-emtricitabine-tenofovir AF.   Meds ordered this encounter  Medications  . bictegravir-emtricitabine-tenofovir AF (BIKTARVY) 50-200-25 MG TABS tablet    Sig: Take 1 tablet by mouth daily.    Dispense:  30 tablet    Refill:  5    Order Specific Question:   Supervising Provider    Answer:   Carlyle Basques [[id number]]     Follow-up: Return in about 4 months (around 02/14/2021), or if symptoms worsen or fail to improve.   Terri Piedra, MSN, FNP-C Nurse Practitioner [facility]  [facility]fect[facility]  [facility] Main number: [phone]

## 2020-10-17 NOTE — Assessment & Plan Note (Signed)
Mr. Slowey continues to have well-controlled HIV disease with good adherence and tolerance to his ART regimen of Biktarvy.  No signs/symptoms of opportunistic infection or progressive HIV disease.  We reviewed lab work and discussed plan of care.  Continue current dose of Biktarvy.  Plan for follow-up in 4 months or sooner if needed with lab work 1 to 2 weeks prior to appointment.

## 2020-10-17 NOTE — Patient Instructions (Signed)
Nice to see you.  Continue to take your [place] daily as prescribed.  Refills have been sent to the pharmacy.  Recommend booster dose for COVID vaccine.  Plan for follow up in 4 months or sooner if needed with lab work 1-2 weeks prior to appointment.   Have a great day and stay safe!

## 2020-11-20 ENCOUNTER — Encounter: Payer: Self-pay | Admitting: Orthopedic Surgery

## 2020-11-20 ENCOUNTER — Ambulatory Visit: Payer: BC Managed Care – PPO | Admitting: Orthopedic Surgery

## 2020-11-20 ENCOUNTER — Other Ambulatory Visit: Payer: Self-pay

## 2020-11-20 VITALS — Ht 65.0 in | Wt 206.0 lb

## 2020-11-20 DIAGNOSIS — M1712 Unilateral primary osteoarthritis, left knee: Secondary | ICD-10-CM

## 2020-11-24 ENCOUNTER — Encounter: Payer: Self-pay | Admitting: Orthopedic Surgery

## 2020-11-24 ENCOUNTER — Other Ambulatory Visit: Payer: Self-pay

## 2020-11-24 MED ORDER — CELECOXIB 100 MG PO CAPS: 100.0000 mg | ORAL_CAPSULE | Freq: Two times a day (BID) | ORAL | 2 refills | Status: DC

## 2020-11-24 NOTE — Progress Notes (Signed)
Office Visit Note   Patient: William Mcfarland           Date of Birth: 06-06-1956           MRN: [id number] Visit Date: 11/20/2020 Requested by: Kathyrn Lass, [employer],  [place] [address] PCP: Kathyrn Lass, MD  Subjective: Chief Complaint  Patient presents with  . Left Knee - Pain    HPI: William Mcfarland is a 64 year old patient with left knee arthritis.  He has an intramedullary rod in the left knee from an open fracture many years ago.  He is having arthritic type pain in the knee.  Presents now for consideration of total knee replacement.  He has seen orthopedic trauma specialist to told him that removal of the nail is not possible.  States he is having constant pain in the knee region.  Localizes the pain to the knee region without radicular component.  Takes Celebrex.  Has night pain rest pain and pain which affects his activities of daily living.  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              ROS: All systems reviewed are negative as they relate to the chief complaint within the history of present illness.  Patient denies  fevers or chills.   Assessment & Plan: Visit Diagnoses:  1. Primary osteoarthritis of left knee     Plan: Impression is left knee arthritis.  Plan is left total knee replacement.  We will use vancomycin powder along with press-fit knee and extra medullary alignment.  Risk benefits of the procedure discussed with the patient including not limited to infection nerve vessel damage knee stiffness.  I did tell him that this would be a harder recovery than he had with his shoulder replacements.  All questions answered.  Follow-Up Instructions: No follow-ups on file.   Orders:  No orders of the defined types were placed in this encounter.  No orders of the defined types were placed in this encounter.     Procedures: No procedures performed   Clinical Data: No additional  findings.  Objective: Vital Signs: Ht 5\' 5"  (1.651 m)   Wt 206 lb (93.4 kg)   BMI 34.28 kg/m   Physical Exam:   Constitutional: Patient appears well-developed HEENT:  Head: Normocephalic Eyes:EOM are normal Neck: Normal range of motion Cardiovascular: Normal rate Pulmonary/chest: Effort normal Neurologic: Patient is alert Skin: Skin is warm Psychiatric: Patient has normal mood and affect    Ortho Exam: Ortho exam demonstrates joint line tenderness medial greater than lateral in the left knee.  Extensor mechanism is intact.  No warmth in the left leg or knee region.  Trace effusion is present.  Multiple incisions and scars in the left lower leg from prior open fracture.  Pedal pulses palpable.  Ankle dorsiflexion plantarflexion intact.  No groin pain with internal ex rotation of the leg.  Specialty Comments:  No specialty comments available.  Imaging: No results found.   PMFS History: Patient Active Problem List   Diagnosis Date Noted  . Gout 07/03/2020  . 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 Healthcare maintenance 06/03/2019  . Human immunodeficiency virus (HIV) disease ([place]) 05/03/2019  . Type 2 diabetes mellitus with hyperglycemia, without long-term current use of insulin ([place]) 05/03/2019  . Essential hypertension 05/03/2019  . Class 1 obesity due to excess calories with serious comorbidity and body mass index (BMI) of 32.0 to 32.9  in adult 05/03/2019  . Degenerative arthritis of shoulder region 05/21/2016  . Malignant neoplasm of prostate ([place]) 11/20/2014  . Travel advice encounter 04/01/2014   Past Medical History:  Diagnosis Date  . Arthritis    shoulders, knees, hips  . At risk for sleep apnea    STOP-BANG= 5     SENT TO PCP 04-12-2015  . History of colon polyps   . History of radiation therapy 45 Gy plus seed implants on 04-13-2015   prostate external beam 02-20-2015 to 03-24-2015  . Hyperlipidemia   . Hypertension   . Lower urinary tract  symptoms (LUTS)   . Prostate cancer [facility]) urologist-  dr ottelin/  oncologist- dr Tammi Klippel   T1c, Gleason 4+3,  PSA 6.83,  vol. 32.56cc--  s/p  external beam radiation 02-20-2015 to 03-24-2015  . Type 2 diabetes mellitus ([place])   . Type 2 diabetes, diet controlled ([place])   . Wears glasses     Family History  Problem Relation Age of Onset  . Stroke Father   . Stroke Mother   . Cancer Neg Hx     Past Surgical History:  Procedure Laterality Date  . COLONOSCOPY W/ POLYPECTOMY  2013  . EYE SURGERY     Cornea replacement left eye (2018)  . ORIF LEFT LEG FX  1988  . ORIF RIGHT ANKLE FX  1985  . PROSTATE BIOPSY    . RADIOACTIVE SEED IMPLANT N/A 04/14/2015   Procedure: RADIOACTIVE SEED IMPLANT;  Surgeon: Kathie Rhodes, MD;  Location: [facility];  Service: Urology;  Laterality: N/A;   64     seeds implanted no seeds found in bladder  . TOTAL SHOULDER ARTHROPLASTY Right 05/21/2016   Procedure: TOTAL SHOULDER ARTHROPLASTY;  Surgeon: Meredith Pel, MD;  Location: [place];  Service: Orthopedics;  Laterality: Right;  . TOTAL SHOULDER ARTHROPLASTY Left 10/05/2019   Procedure: LEFT Total shoulder arthroplasty;  Surgeon: Meredith Pel, MD;  Location: [place];  Service: Orthopedics;  Laterality: Left;   Social History   Occupational History  . Occupation: Air traffic controller  Tobacco Use  . Smoking status: Former Smoker    Packs/day: 0.50    Years: 15.00    Pack years: 7.50    Types: Cigarettes    Quit date: 04/12/1999    Years since quitting: 21.6  . Smokeless tobacco: Never Used  Vaping Use  . Vaping Use: Never used  Substance and Sexual Activity  . Alcohol use: Yes    Comment: occasional  . Drug use: No  . Sexual activity: Not on file

## 2020-11-24 NOTE — Progress Notes (Signed)
Sent in to pharmacy.  

## 2020-12-05 ENCOUNTER — Telehealth: Payer: Self-pay | Admitting: Orthopedic Surgery

## 2020-12-05 NOTE — Telephone Encounter (Signed)
Patient submitted medical release form, [employer] short term disability, and $25.00 cash payment for [employer]. Accepted 12/05/2020.

## 2020-12-19 ENCOUNTER — Other Ambulatory Visit: Payer: Self-pay

## 2020-12-25 NOTE — Progress Notes (Addendum)
Anesthesia Review:  PCP: DR Kathyrn Lass  Cardiologist : Chest x-ray : EKG :  1.11.22- epic  Echo : Stress test: Cardiac Cath :  Activity level:  Sleep Study/ CPAP : Fasting Blood Sugar :      / Checks Blood Sugar -- times a day:   Blood Thinner/ Instructions /Last Dose: ASA / Instructions/ Last Dose :  U/A done 12/26/20 routed to Dr Marlou Sa .  Does not check glucose at home

## 2020-12-25 NOTE — Progress Notes (Signed)
DUE TO COVID-19 ONLY ONE VISITOR IS ALLOWED TO COME WITH YOU AND STAY IN THE WAITING ROOM ONLY DURING PRE OP AND PROCEDURE DAY OF SURGERY. THE 1 VISITOR  MAY VISIT WITH YOU AFTER SURGERY IN YOUR PRIVATE ROOM DURING VISITING HOURS ONLY!  YOU NEED TO HAVE A COVID 19 TEST ON___1/14/2022 ____ @_______ , THIS TEST MUST BE DONE BEFORE SURGERY,  COVID TESTING SITE [address] [place] [address], IT IS ON THE RIGHT GOING OUT [address] APPROXIMATELY  2 MINUTES PAST [employer] ON THE RIGHT. ONCE YOUR COVID TEST IS COMPLETED,  PLEASE BEGIN THE QUARANTINE INSTRUCTIONS AS OUTLINED IN YOUR HANDOUT.                William Mcfarland  12/25/2020   Your procedure is scheduled on:  12/16/2020   Report to [facility] Main  Entrance   Report to admitting at     0900AM     Call this number if you have problems the morning of surgery [phone]    REMEMBER: NO  SOLID FOOD CANDY OR GUM AFTER MIDNIGHT. CLEAR LIQUIDS UNTIL   0830am       . NOTHING BY MOUTH EXCEPT CLEAR LIQUIDS UNTIL    . PLEASE FINISH ENSURE DRINK PER SURGEON ORDER  WHICH NEEDS TO BE COMPLETED AT   0830am    .      CLEAR LIQUID DIET   Foods Allowed                                                                    Coffee and tea, regular and decaf                            Fruit ices (not with fruit pulp)                                      Iced Popsicles                                    Carbonated beverages, regular and diet                                    Cranberry, grape and apple juices Sports drinks like Gatorade Lightly seasoned clear broth or consume(fat free) Sugar, honey syrup ___________________________________________________________________      BRUSH YOUR TEETH MORNING OF SURGERY AND RINSE YOUR MOUTH OUT, NO CHEWING GUM CANDY OR MINTS.     Take these medicines the morning of surgery with A SIP OF WATER: none   DO NOT TAKE ANY DIABETIC MEDICATIONS DAY OF YOUR SURGERY                                You may not have any metal on your body including hair pins and              piercings  Do not  wear jewelry, make-up, lotions, powders or perfumes, deodorant             Do not wear nail polish on your fingernails.  Do not shave  48 hours prior to surgery.              Men may shave face and neck.   Do not bring valuables to the hospital. [place].  Contacts, dentures or bridgework may not be worn into surgery.  Leave suitcase in the car. After surgery it may be brought to your room.     Patients discharged the day of surgery will not be allowed to drive home. IF YOU ARE HAVING SURGERY AND GOING HOME THE SAME DAY, YOU MUST HAVE AN ADULT TO DRIVE YOU HOME AND BE WITH YOU FOR 24 HOURS. YOU MAY GO HOME BY TAXI OR UBER OR ORTHERWISE, BUT AN ADULT MUST ACCOMPANY YOU HOME AND STAY WITH YOU FOR 24 HOURS.  Name and phone number of your driver:  Special Instructions: N/A              Please read over the following fact sheets you were given: _____________________________________________________________________  [facility] - Preparing for Surgery Before surgery, you can play an important role.  Because skin is not sterile, your skin needs to be as free of germs as possible.  You can reduce the number of germs on your skin by washing with CHG (chlorahexidine gluconate) soap before surgery.  CHG is an antiseptic cleaner which kills germs and bonds with the skin to continue killing germs even after washing. Please DO NOT use if you have an allergy to CHG or antibacterial soaps.  If your skin becomes reddened/irritated stop using the CHG and inform your nurse when you arrive at Short Stay. Do not shave (including legs and underarms) for at least 48 hours prior to the first CHG shower.  You may shave your face/neck. Please follow these instructions carefully:  1.  Shower with CHG Soap the night before surgery and the  morning of  Surgery.  2.  If you choose to wash your hair, wash your hair first as usual with your  normal  shampoo.  3.  After you shampoo, rinse your hair and body thoroughly to remove the  shampoo.                           4.  Use CHG as you would any other liquid soap.  You can apply chg directly  to the skin and wash                       Gently with a scrungie or clean washcloth.  5.  Apply the CHG Soap to your body ONLY FROM THE NECK DOWN.   Do not use on face/ open                           Wound or open sores. Avoid contact with eyes, ears mouth and genitals (private parts).                       Wash face,  Genitals (private parts) with your normal soap.             6.  Wash thoroughly, paying  special attention to the area where your surgery  will be performed.  7.  Thoroughly rinse your body with warm water from the neck down.  8.  DO NOT shower/wash with your normal soap after using and rinsing off  the CHG Soap.                9.  Pat yourself dry with a clean towel.            10.  Wear clean pajamas.            11.  Place clean sheets on your bed the night of your first shower and do not  sleep with pets. Day of Surgery : Do not apply any lotions/deodorants the morning of surgery.  Please wear clean clothes to the hospital/surgery center.  FAILURE TO FOLLOW THESE INSTRUCTIONS MAY RESULT IN THE CANCELLATION OF YOUR SURGERY PATIENT SIGNATURE_________________________________  NURSE SIGNATURE__________________________________  ________________________________________________________________________

## 2020-12-26 ENCOUNTER — Encounter (HOSPITAL_COMMUNITY): Payer: Self-pay

## 2020-12-26 ENCOUNTER — Other Ambulatory Visit: Payer: Self-pay

## 2020-12-26 ENCOUNTER — Encounter (HOSPITAL_COMMUNITY)
Admission: RE | Admit: 2020-12-26 | Discharge: 2020-12-26 | Disposition: A | Discharge status: Home / Self Care | Payer: BC Managed Care – PPO | Source: Ambulatory Visit | Attending: Orthopedic Surgery | Admitting: Orthopedic Surgery

## 2020-12-26 DIAGNOSIS — Z01818 Encounter for other preprocedural examination: Secondary | ICD-10-CM | POA: Diagnosis present

## 2020-12-26 LAB — URINALYSIS, ROUTINE W REFLEX MICROSCOPIC
Bacteria, UA: NONE SEEN
Bilirubin Urine: NEGATIVE
Glucose, UA: 150 mg/dL — AB
Ketones, ur: NEGATIVE mg/dL
Leukocytes,Ua: NEGATIVE
Nitrite: NEGATIVE
Protein, ur: NEGATIVE mg/dL
Specific Gravity, Urine: 1.017 (ref 1.005–1.030)
pH: 6 (ref 5.0–8.0)

## 2020-12-26 LAB — CBC
HCT: 44.9 % (ref 39.0–52.0)
Hemoglobin: 15.5 g/dL (ref 13.0–17.0)
MCH: 32.2 pg (ref 26.0–34.0)
MCHC: 34.5 g/dL (ref 30.0–36.0)
MCV: 93.3 fL (ref 80.0–100.0)
Platelets: 355 10*3/uL (ref 150–400)
RBC: 4.81 MIL/uL (ref 4.22–5.81)
RDW: 12.7 % (ref 11.5–15.5)
WBC: 6.7 10*3/uL (ref 4.0–10.5)
nRBC: 0 % (ref 0.0–0.2)

## 2020-12-26 LAB — SURGICAL PCR SCREEN
MRSA, PCR: NEGATIVE
Staphylococcus aureus: NEGATIVE

## 2020-12-26 LAB — BASIC METABOLIC PANEL
Anion gap: 9 (ref 5–15)
BUN: 21 mg/dL (ref 8–23)
CO2: 29 mmol/L (ref 22–32)
Calcium: 9.7 mg/dL (ref 8.9–10.3)
Chloride: 100 mmol/L (ref 98–111)
Creatinine, Ser: 0.7 mg/dL (ref 0.61–1.24)
GFR, Estimated: >60 mL/min (ref >60)
Glucose, Bld: 177 mg/dL — ABNORMAL HIGH (ref 70–99)
Potassium: 4.1 mmol/L (ref 3.5–5.1)
Sodium: 138 mmol/L (ref 135–145)

## 2020-12-26 LAB — HEMOGLOBIN A1C
Hgb A1c MFr Bld: 6.7 % — ABNORMAL HIGH (ref 4.8–5.6)
Mean Plasma Glucose: 145.59 mg/dL

## 2020-12-27 LAB — URINE CULTURE: Culture: NO GROWTH

## 2020-12-29 ENCOUNTER — Other Ambulatory Visit (HOSPITAL_COMMUNITY): Payer: BC Managed Care – PPO

## 2021-01-02 ENCOUNTER — Ambulatory Visit (HOSPITAL_COMMUNITY)
Admission: RE | Admit: 2021-01-02 | Payer: BC Managed Care – PPO | Source: Home / Self Care | Admitting: Orthopedic Surgery

## 2021-01-02 ENCOUNTER — Encounter (HOSPITAL_COMMUNITY): Admission: RE | Payer: Self-pay | Source: Home / Self Care

## 2021-01-02 SURGERY — ARTHROPLASTY, KNEE, TOTAL
Anesthesia: Spinal | Site: Knee | Laterality: Left

## 2021-01-09 NOTE — Pre-Procedure Instructions (Signed)
[facility] [place], [place] [address] [place] [address] Phone: [phone] Fax: [phone]  [place], [place] - [place] [address] Phone: [phone] Fax: [phone]  [facility], [place] [address] [place] [address] Phone: [phone] Fax: [phone]      Your procedure is scheduled on Tuesday, February 1st.  Report to [place] Entrance "A" at 1:45 P.M., and check in at the Admitting office.  Call this number if you have problems the morning of surgery:  [phone]  Call [phone] if you have any questions prior to your surgery date Monday-Friday 8am-4pm    Remember:  Do not eat after midnight the night before your surgery  You may drink clear liquids until 12:45 the afternoon of your surgery.   Clear liquids allowed are: Water, Non-Citrus Juices (without pulp), Carbonated Beverages, Clear Tea, Black Coffee Only, and Gatorade.  Please complete the 10oz bottle of water that was provided to you by 12:45p.m. day of surgery.     Take bictegravir-emtricitabine-tenofovir AF (BIKTARVY) with a sip of water the morning of surgery.   As of today, STOP taking any Aspirin (unless otherwise instructed by your surgeon) Aleve, Naproxen, Ibuprofen, Motrin, Advil, Goody's, BC's, all herbal medications, fish oil, and all vitamins. This includes: celecoxib (CELEBREX) and diclofenac.    WHAT DO I DO ABOUT MY DIABETES MEDICATION?   Marland Kitchen Do not take metFORMIN (GLUCOPHAGE) the morning of surgery.    HOW TO MANAGE YOUR DIABETES BEFORE AND AFTER SURGERY  Why is it important to control my blood sugar before and after surgery? . Improving blood sugar levels before and after surgery helps healing and can limit problems. . A way of improving blood sugar  control is eating a healthy diet by: o  Eating less sugar and carbohydrates o  Increasing activity/exercise o  Talking with your doctor about reaching your blood sugar goals . High blood sugars (greater than 180 mg/dL) can raise your risk of infections and slow your recovery, so you will need to focus on controlling your diabetes during the weeks before surgery. . Make sure that the doctor who takes care of your diabetes knows about your planned surgery including the date and location.  How do I manage my blood sugar before surgery? . Check your blood sugar at least 4 times a day, starting 2 days before surgery, to make sure that the level is not too high or low. . Check your blood sugar the morning of your surgery when you wake up and every 2 hours until you get to the Short Stay unit. o If your blood sugar is less than 70 mg/dL, you will need to treat for low blood sugar: - Do not take insulin. - Treat a low blood sugar (less than 70 mg/dL) with  cup of clear juice (cranberry or apple), 4 glucose tablets, OR glucose gel. - Recheck blood sugar in 15 minutes after treatment (to make sure it is greater than 70 mg/dL). If your blood sugar is not greater than 70 mg/dL on recheck, call [phone] for further instructions. . Report your blood sugar to the short stay nurse when you get to Short Stay.  . If you are admitted to the hospital after surgery: o Your blood sugar will be checked by the staff and  you will probably be given insulin after surgery (instead of oral diabetes medicines) to make sure you have good blood sugar levels. o The goal for blood sugar control after surgery is 80-180 mg/dL.                      Do not wear jewelry.            Do not wear lotions, powders, colognes, or deodorant.            Men may shave face and neck.            Do not bring valuables to the hospital.            [facility] is not responsible for any belongings or valuables.  Do NOT Smoke  (Tobacco/Vaping) or drink Alcohol 24 hours prior to your procedure If you use a CPAP at night, you may bring all equipment for your overnight stay.   Contacts, glasses, dentures or bridgework may not be worn into surgery.      For patients admitted to the hospital, discharge time will be determined by your treatment team.   Patients discharged the day of surgery will not be allowed to drive home, and someone needs to stay with them for 24 hours.    Special instructions:   [facility]- Preparing For Surgery  Before surgery, you can play an important role. Because skin is not sterile, your skin needs to be as free of germs as possible. You can reduce the number of germs on your skin by washing with CHG (chlorahexidine gluconate) Soap before surgery.  CHG is an antiseptic cleaner which kills germs and bonds with the skin to continue killing germs even after washing.    Oral Hygiene is also important to reduce your risk of infection.  Remember - BRUSH YOUR TEETH THE MORNING OF SURGERY WITH YOUR REGULAR TOOTHPASTE  Please do not use if you have an allergy to CHG or antibacterial soaps. If your skin becomes reddened/irritated stop using the CHG.  Do not shave (including legs and underarms) for at least 48 hours prior to first CHG shower. It is OK to shave your face.  Please follow these instructions carefully.   1. Shower the NIGHT BEFORE SURGERY and the MORNING OF SURGERY with CHG Soap.   2. If you chose to wash your hair, wash your hair first as usual with your normal shampoo.  3. After you shampoo, rinse your hair and body thoroughly to remove the shampoo.  4. Use CHG as you would any other liquid soap. You can apply CHG directly to the skin and wash gently with a scrungie or a clean washcloth.   5. Apply the CHG Soap to your body ONLY FROM THE NECK DOWN.  Do not use on open wounds or open sores. Avoid contact with your eyes, ears, mouth and genitals (private parts). Wash Face and  genitals (private parts)  with your normal soap.   6. Wash thoroughly, paying special attention to the area where your surgery will be performed.  7. Thoroughly rinse your body with warm water from the neck down.  8. DO NOT shower/wash with your normal soap after using and rinsing off the CHG Soap.  9. Pat yourself dry with a CLEAN TOWEL.  10. Wear CLEAN PAJAMAS to bed the night before surgery  11. Place CLEAN SHEETS on your bed the night of your first shower and DO NOT SLEEP WITH PETS.  Day of Surgery: Wear Clean/Comfortable clothing the morning of surgery Do not apply any deodorants/lotions.   Remember to brush your teeth WITH YOUR REGULAR TOOTHPASTE.   Please read over the following fact sheets that you were given.

## 2021-01-10 ENCOUNTER — Encounter (HOSPITAL_COMMUNITY)
Admission: RE | Admit: 2021-01-10 | Discharge: 2021-01-10 | Disposition: A | Discharge status: Home / Self Care | Payer: BC Managed Care – PPO | Source: Ambulatory Visit | Attending: Orthopedic Surgery | Admitting: Orthopedic Surgery

## 2021-01-10 ENCOUNTER — Encounter (HOSPITAL_COMMUNITY): Payer: Self-pay

## 2021-01-10 ENCOUNTER — Other Ambulatory Visit: Payer: Self-pay

## 2021-01-10 DIAGNOSIS — Z01812 Encounter for preprocedural laboratory examination: Secondary | ICD-10-CM | POA: Diagnosis present

## 2021-01-10 LAB — BASIC METABOLIC PANEL
Anion gap: 13 (ref 5–15)
BUN: 16 mg/dL (ref 8–23)
CO2: 26 mmol/L (ref 22–32)
Calcium: 9.7 mg/dL (ref 8.9–10.3)
Chloride: 98 mmol/L (ref 98–111)
Creatinine, Ser: 0.82 mg/dL (ref 0.61–1.24)
GFR, Estimated: >60 mL/min (ref >60)
Glucose, Bld: 133 mg/dL — ABNORMAL HIGH (ref 70–99)
Potassium: 4.3 mmol/L (ref 3.5–5.1)
Sodium: 137 mmol/L (ref 135–145)

## 2021-01-10 LAB — CBC
HCT: 45.2 % (ref 39.0–52.0)
Hemoglobin: 15.3 g/dL (ref 13.0–17.0)
MCH: 31.4 pg (ref 26.0–34.0)
MCHC: 33.8 g/dL (ref 30.0–36.0)
MCV: 92.8 fL (ref 80.0–100.0)
Platelets: 346 10*3/uL (ref 150–400)
RBC: 4.87 MIL/uL (ref 4.22–5.81)
RDW: 12.6 % (ref 11.5–15.5)
WBC: 6.8 10*3/uL (ref 4.0–10.5)
nRBC: 0 % (ref 0.0–0.2)

## 2021-01-10 LAB — URINALYSIS, ROUTINE W REFLEX MICROSCOPIC
Bacteria, UA: NONE SEEN
Bilirubin Urine: NEGATIVE
Glucose, UA: NEGATIVE mg/dL
Ketones, ur: NEGATIVE mg/dL
Leukocytes,Ua: NEGATIVE
Nitrite: NEGATIVE
Protein, ur: NEGATIVE mg/dL
Specific Gravity, Urine: 1.018 (ref 1.005–1.030)
pH: 6 (ref 5.0–8.0)

## 2021-01-10 LAB — GLUCOSE, CAPILLARY: Glucose-Capillary: 149 mg/dL — ABNORMAL HIGH (ref 70–99)

## 2021-01-10 MED ORDER — CHLORHEXIDINE GLUCONATE 0.12 % MT SOLN: 15.0000 mL | Freq: Once | OROMUCOSAL | Status: DC

## 2021-01-10 MED ORDER — ORAL CARE MOUTH RINSE: 15.0000 mL | Freq: Once | OROMUCOSAL | Status: DC

## 2021-01-10 NOTE — Progress Notes (Signed)
PCP - [place] Cardiologist - NA    Chest x-ray - NA EKG - 1/22 Stress Test - NA ECHO - NA Cardiac Cath - NA   Fasting Blood Sugar - ? Checks Blood Sugar ___0__ times a day  Aspirin Instructions:STOP  ERAS Protcol -INSTRUCTED PRE-SURGERY Ensure or G2- WATER GIVEN  COVID TEST- FOR Friday   28TH   Anesthesia review: HTN  Patient denies shortness of breath, fever, cough and chest pain at PAT appointment   All instructions explained to the patient, with a verbal understanding of the material. Patient agrees to go over the instructions while at home for a better understanding. Patient also instructed to self quarantine after being tested for COVID-19. The opportunity to ask questions was provided.

## 2021-01-11 LAB — URINE CULTURE: Culture: NO GROWTH

## 2021-01-12 ENCOUNTER — Other Ambulatory Visit (HOSPITAL_COMMUNITY)
Admission: RE | Admit: 2021-01-12 | Discharge: 2021-01-12 | Disposition: A | Discharge status: Home / Self Care | Payer: BC Managed Care – PPO | Source: Ambulatory Visit | Attending: Orthopedic Surgery | Admitting: Orthopedic Surgery

## 2021-01-12 DIAGNOSIS — Z20822 Contact with and (suspected) exposure to covid-19: Secondary | ICD-10-CM | POA: Diagnosis not present

## 2021-01-12 DIAGNOSIS — Z01812 Encounter for preprocedural laboratory examination: Secondary | ICD-10-CM | POA: Diagnosis present

## 2021-01-12 LAB — SARS CORONAVIRUS 2 (TAT 6-24 HRS): SARS Coronavirus 2: NEGATIVE

## 2021-01-14 ENCOUNTER — Other Ambulatory Visit: Payer: Self-pay | Admitting: Surgical

## 2021-01-14 MED ORDER — ACETAMINOPHEN 325 MG PO TABS: 975.0000 mg | ORAL_TABLET | Freq: Once | ORAL | 0 refills | Status: AC

## 2021-01-14 MED ORDER — CELECOXIB 200 MG PO CAPS: 400.0000 mg | ORAL_CAPSULE | Freq: Once | ORAL | 0 refills | Status: AC

## 2021-01-14 MED ORDER — TRAMADOL HCL 50 MG PO TABS: 50.0000 mg | ORAL_TABLET | Freq: Once | ORAL | 0 refills | Status: AC

## 2021-01-14 MED ORDER — GABAPENTIN 300 MG PO CAPS: 300.0000 mg | ORAL_CAPSULE | Freq: Once | ORAL | 0 refills | Status: DC

## 2021-01-15 ENCOUNTER — Other Ambulatory Visit: Payer: Self-pay | Admitting: Surgical

## 2021-01-15 MED ORDER — TRANEXAMIC ACID 1000 MG/10ML IV SOLN
2000.0000 mg | INTRAVENOUS | Status: AC
  Administered 2021-01-16: 2000 mg via TOPICAL
  Filled 2021-01-15: qty 20

## 2021-01-15 NOTE — Telephone Encounter (Signed)
Pls advise.  

## 2021-01-16 ENCOUNTER — Observation Stay (HOSPITAL_COMMUNITY)
Admission: RE | Admit: 2021-01-16 | Discharge: 2021-01-17 | Disposition: A | Discharge status: Home / Self Care | Payer: BC Managed Care – PPO | Attending: Orthopedic Surgery | Admitting: Orthopedic Surgery

## 2021-01-16 ENCOUNTER — Other Ambulatory Visit: Payer: Self-pay | Admitting: Surgical

## 2021-01-16 ENCOUNTER — Ambulatory Visit (HOSPITAL_COMMUNITY): Payer: BC Managed Care – PPO | Admitting: Physician Assistant

## 2021-01-16 ENCOUNTER — Encounter (HOSPITAL_COMMUNITY)
Admission: RE | Disposition: A | Discharge status: Home / Self Care | Payer: Self-pay | Source: Home / Self Care | Attending: Orthopedic Surgery

## 2021-01-16 ENCOUNTER — Other Ambulatory Visit: Payer: Self-pay

## 2021-01-16 ENCOUNTER — Ambulatory Visit (HOSPITAL_COMMUNITY): Payer: BC Managed Care – PPO | Admitting: Certified Registered Nurse Anesthetist

## 2021-01-16 ENCOUNTER — Encounter (HOSPITAL_COMMUNITY): Payer: Self-pay | Admitting: Orthopedic Surgery

## 2021-01-16 DIAGNOSIS — I10 Essential (primary) hypertension: Secondary | ICD-10-CM | POA: Diagnosis not present

## 2021-01-16 DIAGNOSIS — Z6832 Body mass index (BMI) 32.0-32.9, adult: Secondary | ICD-10-CM | POA: Insufficient documentation

## 2021-01-16 DIAGNOSIS — E1165 Type 2 diabetes mellitus with hyperglycemia: Secondary | ICD-10-CM | POA: Insufficient documentation

## 2021-01-16 DIAGNOSIS — M1712 Unilateral primary osteoarthritis, left knee: Secondary | ICD-10-CM | POA: Diagnosis not present

## 2021-01-16 DIAGNOSIS — Z791 Long term (current) use of non-steroidal anti-inflammatories (NSAID): Secondary | ICD-10-CM | POA: Diagnosis not present

## 2021-01-16 DIAGNOSIS — Z79899 Other long term (current) drug therapy: Secondary | ICD-10-CM | POA: Insufficient documentation

## 2021-01-16 DIAGNOSIS — Z7984 Long term (current) use of oral hypoglycemic drugs: Secondary | ICD-10-CM | POA: Diagnosis not present

## 2021-01-16 DIAGNOSIS — Z96652 Presence of left artificial knee joint: Secondary | ICD-10-CM

## 2021-01-16 DIAGNOSIS — B20 Human immunodeficiency virus [HIV] disease: Secondary | ICD-10-CM | POA: Diagnosis not present

## 2021-01-16 DIAGNOSIS — M25562 Pain in left knee: Secondary | ICD-10-CM | POA: Diagnosis present

## 2021-01-16 DIAGNOSIS — Z8546 Personal history of malignant neoplasm of prostate: Secondary | ICD-10-CM | POA: Diagnosis not present

## 2021-01-16 HISTORY — PX: TOTAL KNEE ARTHROPLASTY: SHX125

## 2021-01-16 LAB — GLUCOSE, CAPILLARY
Glucose-Capillary: 133 mg/dL — ABNORMAL HIGH (ref 70–99)
Glucose-Capillary: 218 mg/dL — ABNORMAL HIGH (ref 70–99)
Glucose-Capillary: 307 mg/dL — ABNORMAL HIGH (ref 70–99)

## 2021-01-16 SURGERY — ARTHROPLASTY, KNEE, TOTAL
Anesthesia: Spinal | Site: Knee | Laterality: Left

## 2021-01-16 MED ORDER — ACETAMINOPHEN 500 MG PO TABS
1000.0000 mg | ORAL_TABLET | Freq: Once | ORAL | Status: AC
  Administered 2021-01-16: 1000 mg via ORAL
  Filled 2021-01-16: qty 2

## 2021-01-16 MED ORDER — OXYCODONE HCL 5 MG PO TABS
5.0000 mg | ORAL_TABLET | ORAL | Status: DC | PRN
  Administered 2021-01-16 – 2021-01-17 (×3): 10 mg via ORAL
  Filled 2021-01-16 (×3): qty 2

## 2021-01-16 MED ORDER — CELECOXIB 200 MG PO CAPS
200.0000 mg | ORAL_CAPSULE | Freq: Two times a day (BID) | ORAL | Status: DC
  Administered 2021-01-16 – 2021-01-17 (×2): 200 mg via ORAL
  Filled 2021-01-16 (×2): qty 1

## 2021-01-16 MED ORDER — LACTATED RINGERS IV BOLUS: 250.0000 mL | Freq: Once | INTRAVENOUS | Status: AC

## 2021-01-16 MED ORDER — MIDAZOLAM HCL 2 MG/2ML IJ SOLN
INTRAMUSCULAR | Status: AC
  Administered 2021-01-16: 2 mg via INTRAVENOUS
  Filled 2021-01-16: qty 2

## 2021-01-16 MED ORDER — LACTATED RINGERS IV SOLN: INTRAVENOUS | Status: DC

## 2021-01-16 MED ORDER — ATORVASTATIN CALCIUM 10 MG PO TABS
10.0000 mg | ORAL_TABLET | Freq: Every day | ORAL | Status: DC
  Administered 2021-01-16: 10 mg via ORAL
  Filled 2021-01-16: qty 1

## 2021-01-16 MED ORDER — INSULIN ASPART 100 UNIT/ML ~~LOC~~ SOLN
4.0000 [IU] | Freq: Three times a day (TID) | SUBCUTANEOUS | Status: DC
  Administered 2021-01-17: 4 [IU] via SUBCUTANEOUS

## 2021-01-16 MED ORDER — ACETAMINOPHEN 500 MG PO TABS
1000.0000 mg | ORAL_TABLET | Freq: Four times a day (QID) | ORAL | Status: DC
  Administered 2021-01-16 – 2021-01-17 (×2): 1000 mg via ORAL
  Filled 2021-01-16 (×2): qty 2

## 2021-01-16 MED ORDER — METHOCARBAMOL 500 MG PO TABS: 500.0000 mg | ORAL_TABLET | Freq: Three times a day (TID) | ORAL | 0 refills | Status: DC | PRN

## 2021-01-16 MED ORDER — POVIDONE-IODINE 7.5 % EX SOLN
Freq: Once | CUTANEOUS | Status: DC
  Filled 2021-01-16: qty 118

## 2021-01-16 MED ORDER — PROPOFOL 500 MG/50ML IV EMUL
INTRAVENOUS | Status: DC | PRN
  Administered 2021-01-16 (×2): 100 ug/kg/min via INTRAVENOUS
  Administered 2021-01-16: 125 ug/kg/min via INTRAVENOUS

## 2021-01-16 MED ORDER — METOCLOPRAMIDE HCL 5 MG/ML IJ SOLN: 5.0000 mg | Freq: Three times a day (TID) | INTRAMUSCULAR | Status: DC | PRN

## 2021-01-16 MED ORDER — OXYCODONE-ACETAMINOPHEN 5-325 MG PO TABS: 1.0000 | ORAL_TABLET | ORAL | 0 refills | Status: DC | PRN

## 2021-01-16 MED ORDER — 0.9 % SODIUM CHLORIDE (POUR BTL) OPTIME
TOPICAL | Status: DC | PRN
  Administered 2021-01-16: 1000 mL

## 2021-01-16 MED ORDER — MORPHINE SULFATE (PF) 4 MG/ML IV SOLN
INTRAVENOUS | Status: AC
  Filled 2021-01-16: qty 2

## 2021-01-16 MED ORDER — MIDAZOLAM HCL 2 MG/2ML IJ SOLN
INTRAMUSCULAR | Status: AC
  Filled 2021-01-16: qty 2

## 2021-01-16 MED ORDER — BUPIVACAINE LIPOSOME 1.3 % IJ SUSP
20.0000 mL | INTRAMUSCULAR | Status: AC
  Administered 2021-01-16: 20 mL
  Filled 2021-01-16: qty 20

## 2021-01-16 MED ORDER — SODIUM CHLORIDE 0.9 % IR SOLN
Status: DC | PRN
  Administered 2021-01-16: 3000 mL

## 2021-01-16 MED ORDER — METFORMIN HCL 500 MG PO TABS
500.0000 mg | ORAL_TABLET | Freq: Two times a day (BID) | ORAL | Status: DC
  Administered 2021-01-17: 500 mg via ORAL
  Filled 2021-01-16: qty 1

## 2021-01-16 MED ORDER — IRRISEPT - 450ML BOTTLE WITH 0.05% CHG IN STERILE WATER, USP 99.95% OPTIME
TOPICAL | Status: DC | PRN
  Administered 2021-01-16: 450 mL

## 2021-01-16 MED ORDER — MIDAZOLAM HCL 5 MG/5ML IJ SOLN
INTRAMUSCULAR | Status: DC | PRN
  Administered 2021-01-16: 2 mg via INTRAVENOUS

## 2021-01-16 MED ORDER — BUPIVACAINE HCL (PF) 0.5 % IJ SOLN
INTRAMUSCULAR | Status: DC | PRN
  Administered 2021-01-16: 3 mL via INTRATHECAL

## 2021-01-16 MED ORDER — MIDAZOLAM HCL 2 MG/2ML IJ SOLN: 1.0000 mg | Freq: Once | INTRAMUSCULAR | Status: DC

## 2021-01-16 MED ORDER — DEXAMETHASONE SODIUM PHOSPHATE 10 MG/ML IJ SOLN
INTRAMUSCULAR | Status: DC | PRN
  Administered 2021-01-16: 10 mg

## 2021-01-16 MED ORDER — PROPOFOL 10 MG/ML IV BOLUS
INTRAVENOUS | Status: DC | PRN
  Administered 2021-01-16: 50 mg via INTRAVENOUS
  Administered 2021-01-16: 20 mg via INTRAVENOUS

## 2021-01-16 MED ORDER — ALBUMIN HUMAN 5 % IV SOLN: INTRAVENOUS | Status: DC | PRN

## 2021-01-16 MED ORDER — LIDOCAINE 2% (20 MG/ML) 5 ML SYRINGE
INTRAMUSCULAR | Status: DC | PRN
  Administered 2021-01-16: 60 mg via INTRAVENOUS

## 2021-01-16 MED ORDER — BUPIVACAINE HCL 0.25 % IJ SOLN
INTRAMUSCULAR | Status: DC | PRN
  Administered 2021-01-16: 20 mL

## 2021-01-16 MED ORDER — CLONIDINE HCL (ANALGESIA) 100 MCG/ML EP SOLN
EPIDURAL | Status: AC
  Filled 2021-01-16: qty 10

## 2021-01-16 MED ORDER — POVIDONE-IODINE 10 % EX SWAB: 2.0000 "application " | Freq: Once | CUTANEOUS | Status: DC

## 2021-01-16 MED ORDER — METOCLOPRAMIDE HCL 5 MG PO TABS: 5.0000 mg | ORAL_TABLET | Freq: Three times a day (TID) | ORAL | Status: DC | PRN

## 2021-01-16 MED ORDER — LISINOPRIL-HYDROCHLOROTHIAZIDE 20-12.5 MG PO TABS: 1.0000 | ORAL_TABLET | Freq: Every day | ORAL | Status: DC

## 2021-01-16 MED ORDER — LISINOPRIL 20 MG PO TABS
20.0000 mg | ORAL_TABLET | Freq: Every day | ORAL | Status: DC
  Administered 2021-01-16 – 2021-01-17 (×2): 20 mg via ORAL
  Filled 2021-01-16 (×2): qty 1

## 2021-01-16 MED ORDER — BUPIVACAINE-EPINEPHRINE (PF) 0.25% -1:200000 IJ SOLN
INTRAMUSCULAR | Status: AC
  Filled 2021-01-16: qty 20

## 2021-01-16 MED ORDER — FENTANYL CITRATE (PF) 100 MCG/2ML IJ SOLN
100.0000 ug | Freq: Once | INTRAMUSCULAR | Status: AC
  Administered 2021-01-16: 100 ug via INTRAVENOUS

## 2021-01-16 MED ORDER — MENTHOL 3 MG MT LOZG: 1.0000 | LOZENGE | OROMUCOSAL | Status: DC | PRN

## 2021-01-16 MED ORDER — METHOCARBAMOL 1000 MG/10ML IJ SOLN
500.0000 mg | Freq: Four times a day (QID) | INTRAVENOUS | Status: DC | PRN
  Filled 2021-01-16: qty 5

## 2021-01-16 MED ORDER — HYDROMORPHONE HCL 1 MG/ML IJ SOLN
0.5000 mg | INTRAMUSCULAR | Status: DC | PRN
  Administered 2021-01-16: 0.5 mg via INTRAVENOUS
  Filled 2021-01-16: qty 0.5

## 2021-01-16 MED ORDER — FENTANYL CITRATE (PF) 100 MCG/2ML IJ SOLN: 50.0000 ug | Freq: Once | INTRAMUSCULAR | Status: DC

## 2021-01-16 MED ORDER — MIDAZOLAM HCL 2 MG/2ML IJ SOLN: 2.0000 mg | Freq: Once | INTRAMUSCULAR | Status: AC

## 2021-01-16 MED ORDER — BUPIVACAINE HCL (PF) 0.25 % IJ SOLN
INTRAMUSCULAR | Status: AC
  Filled 2021-01-16: qty 30

## 2021-01-16 MED ORDER — ROPIVACAINE HCL 7.5 MG/ML IJ SOLN
INTRAMUSCULAR | Status: DC | PRN
  Administered 2021-01-16: 20 mL via PERINEURAL

## 2021-01-16 MED ORDER — VANCOMYCIN HCL 1000 MG IV SOLR
INTRAVENOUS | Status: AC
  Filled 2021-01-16: qty 1000

## 2021-01-16 MED ORDER — LACTATED RINGERS IV BOLUS: 500.0000 mL | Freq: Once | INTRAVENOUS | Status: AC

## 2021-01-16 MED ORDER — FENTANYL CITRATE (PF) 100 MCG/2ML IJ SOLN: 25.0000 ug | INTRAMUSCULAR | Status: DC | PRN

## 2021-01-16 MED ORDER — GABAPENTIN 300 MG PO CAPS: 300.0000 mg | ORAL_CAPSULE | Freq: Three times a day (TID) | ORAL | 0 refills | Status: DC

## 2021-01-16 MED ORDER — ORAL CARE MOUTH RINSE: 15.0000 mL | Freq: Once | OROMUCOSAL | Status: AC

## 2021-01-16 MED ORDER — ONDANSETRON HCL 4 MG/2ML IJ SOLN
INTRAMUSCULAR | Status: DC | PRN
  Administered 2021-01-16: 4 mg via INTRAVENOUS

## 2021-01-16 MED ORDER — EPHEDRINE SULFATE 50 MG/ML IJ SOLN
INTRAMUSCULAR | Status: DC | PRN
  Administered 2021-01-16: 5 mg via INTRAVENOUS
  Administered 2021-01-16: 2.5 mg via INTRAVENOUS

## 2021-01-16 MED ORDER — FENTANYL CITRATE (PF) 250 MCG/5ML IJ SOLN
INTRAMUSCULAR | Status: AC
  Filled 2021-01-16: qty 5

## 2021-01-16 MED ORDER — HYDROCHLOROTHIAZIDE 12.5 MG PO CAPS
12.5000 mg | ORAL_CAPSULE | Freq: Every day | ORAL | Status: DC
  Administered 2021-01-16 – 2021-01-17 (×2): 12.5 mg via ORAL
  Filled 2021-01-16 (×2): qty 1

## 2021-01-16 MED ORDER — INSULIN ASPART 100 UNIT/ML ~~LOC~~ SOLN
0.0000 [IU] | Freq: Three times a day (TID) | SUBCUTANEOUS | Status: DC
  Administered 2021-01-17: 3 [IU] via SUBCUTANEOUS

## 2021-01-16 MED ORDER — ASPIRIN 81 MG PO CHEW: 81.0000 mg | CHEWABLE_TABLET | Freq: Every day | ORAL | 0 refills | Status: AC

## 2021-01-16 MED ORDER — CELECOXIB 100 MG PO CAPS: 100.0000 mg | ORAL_CAPSULE | Freq: Two times a day (BID) | ORAL | Status: DC

## 2021-01-16 MED ORDER — CHLORHEXIDINE GLUCONATE 0.12 % MT SOLN: 15.0000 mL | Freq: Once | OROMUCOSAL | Status: AC

## 2021-01-16 MED ORDER — FENTANYL CITRATE (PF) 100 MCG/2ML IJ SOLN
INTRAMUSCULAR | Status: AC
  Filled 2021-01-16: qty 2

## 2021-01-16 MED ORDER — CLONIDINE HCL (ANALGESIA) 100 MCG/ML EP SOLN
EPIDURAL | Status: DC | PRN
  Administered 2021-01-16: 1 mL via INTRA_ARTICULAR

## 2021-01-16 MED ORDER — INSULIN ASPART 100 UNIT/ML ~~LOC~~ SOLN
0.0000 [IU] | Freq: Every day | SUBCUTANEOUS | Status: DC
  Administered 2021-01-16: 4 [IU] via SUBCUTANEOUS

## 2021-01-16 MED ORDER — CHLORHEXIDINE GLUCONATE 0.12 % MT SOLN
OROMUCOSAL | Status: AC
  Administered 2021-01-16: 15 mL via OROMUCOSAL
  Filled 2021-01-16: qty 15

## 2021-01-16 MED ORDER — TRANEXAMIC ACID-NACL 1000-0.7 MG/100ML-% IV SOLN
1000.0000 mg | INTRAVENOUS | Status: AC
  Administered 2021-01-16: 1000 mg via INTRAVENOUS

## 2021-01-16 MED ORDER — FENTANYL CITRATE (PF) 250 MCG/5ML IJ SOLN
INTRAMUSCULAR | Status: DC | PRN
  Administered 2021-01-16 (×2): 50 ug via INTRAVENOUS

## 2021-01-16 MED ORDER — CEFAZOLIN SODIUM-DEXTROSE 2-4 GM/100ML-% IV SOLN
2.0000 g | Freq: Three times a day (TID) | INTRAVENOUS | Status: AC
  Administered 2021-01-16 – 2021-01-17 (×2): 2 g via INTRAVENOUS
  Filled 2021-01-16 (×2): qty 100

## 2021-01-16 MED ORDER — CELECOXIB 200 MG PO CAPS
200.0000 mg | ORAL_CAPSULE | Freq: Once | ORAL | Status: AC
  Administered 2021-01-16: 200 mg via ORAL
  Filled 2021-01-16: qty 1

## 2021-01-16 MED ORDER — INSULIN ASPART 100 UNIT/ML ~~LOC~~ SOLN: 0.0000 [IU] | Freq: Three times a day (TID) | SUBCUTANEOUS | Status: DC

## 2021-01-16 MED ORDER — METHOCARBAMOL 500 MG PO TABS
500.0000 mg | ORAL_TABLET | Freq: Four times a day (QID) | ORAL | Status: DC | PRN
  Administered 2021-01-16 – 2021-01-17 (×2): 500 mg via ORAL
  Filled 2021-01-16 (×2): qty 1

## 2021-01-16 MED ORDER — CEFAZOLIN SODIUM-DEXTROSE 2-4 GM/100ML-% IV SOLN
2.0000 g | INTRAVENOUS | Status: AC
  Administered 2021-01-16: 2 g via INTRAVENOUS

## 2021-01-16 MED ORDER — ASPIRIN 81 MG PO CHEW
81.0000 mg | CHEWABLE_TABLET | Freq: Two times a day (BID) | ORAL | Status: DC
  Administered 2021-01-16 – 2021-01-17 (×2): 81 mg via ORAL
  Filled 2021-01-16 (×2): qty 1

## 2021-01-16 MED ORDER — PHENYLEPHRINE HCL (PRESSORS) 10 MG/ML IV SOLN
INTRAVENOUS | Status: DC | PRN
  Administered 2021-01-16 (×3): 80 ug via INTRAVENOUS

## 2021-01-16 MED ORDER — PHENYLEPHRINE HCL-NACL 10-0.9 MG/250ML-% IV SOLN
INTRAVENOUS | Status: DC | PRN
  Administered 2021-01-16: 25 ug/min via INTRAVENOUS

## 2021-01-16 MED ORDER — DOCUSATE SODIUM 100 MG PO CAPS
100.0000 mg | ORAL_CAPSULE | Freq: Two times a day (BID) | ORAL | Status: DC
  Administered 2021-01-16 – 2021-01-17 (×2): 100 mg via ORAL
  Filled 2021-01-16 (×2): qty 1

## 2021-01-16 MED ORDER — GABAPENTIN 300 MG PO CAPS
300.0000 mg | ORAL_CAPSULE | Freq: Three times a day (TID) | ORAL | Status: DC
  Administered 2021-01-16 – 2021-01-17 (×2): 300 mg via ORAL
  Filled 2021-01-16 (×2): qty 1

## 2021-01-16 MED ORDER — CEFAZOLIN SODIUM-DEXTROSE 2-4 GM/100ML-% IV SOLN
INTRAVENOUS | Status: AC
  Filled 2021-01-16: qty 100

## 2021-01-16 MED ORDER — AMISULPRIDE (ANTIEMETIC) 5 MG/2ML IV SOLN: 10.0000 mg | Freq: Once | INTRAVENOUS | Status: DC | PRN

## 2021-01-16 MED ORDER — GLYCOPYRROLATE 0.2 MG/ML IJ SOLN
INTRAMUSCULAR | Status: DC | PRN
  Administered 2021-01-16: .2 mg via INTRAVENOUS

## 2021-01-16 MED ORDER — PHENOL 1.4 % MT LIQD: 1.0000 | OROMUCOSAL | Status: DC | PRN

## 2021-01-16 MED ORDER — ONDANSETRON HCL 4 MG/2ML IJ SOLN: 4.0000 mg | Freq: Four times a day (QID) | INTRAMUSCULAR | Status: DC | PRN

## 2021-01-16 MED ORDER — ONDANSETRON HCL 4 MG PO TABS: 4.0000 mg | ORAL_TABLET | Freq: Four times a day (QID) | ORAL | Status: DC | PRN

## 2021-01-16 MED ORDER — MORPHINE SULFATE (PF) 4 MG/ML IV SOLN
INTRAVENOUS | Status: DC | PRN
  Administered 2021-01-16: 8 mg via SUBCUTANEOUS

## 2021-01-16 MED ORDER — TRANEXAMIC ACID-NACL 1000-0.7 MG/100ML-% IV SOLN
INTRAVENOUS | Status: AC
  Filled 2021-01-16: qty 100

## 2021-01-16 MED ORDER — LACTATED RINGERS IV SOLN: INTRAVENOUS | Status: DC | PRN

## 2021-01-16 MED ORDER — SODIUM CHLORIDE 0.9% FLUSH
INTRAVENOUS | Status: DC | PRN
  Administered 2021-01-16: 20 mL via INTRAVENOUS

## 2021-01-16 SURGICAL SUPPLY — 81 items
BAG DECANTER FOR FLEXI CONT (MISCELLANEOUS) ×2 IMPLANT
BANDAGE ESMARK 6X9 LF (GAUZE/BANDAGES/DRESSINGS) ×1 IMPLANT
BLADE SAG 18X100X1.27 (BLADE) ×2 IMPLANT
BNDG COHESIVE 6X5 TAN STRL LF (GAUZE/BANDAGES/DRESSINGS) ×2 IMPLANT
BNDG ELASTIC 6X15 VLCR STRL LF (GAUZE/BANDAGES/DRESSINGS) ×2 IMPLANT
BNDG ESMARK 6X9 LF (GAUZE/BANDAGES/DRESSINGS) ×2
BOWL SMART MIX CTS (DISPOSABLE) IMPLANT
CLSR STERI-STRIP ANTIMIC 1/2X4 (GAUZE/BANDAGES/DRESSINGS) ×4 IMPLANT
CNTNR URN SCR LID CUP LEK RST (MISCELLANEOUS) ×1 IMPLANT
COMP FEMORAL TRIATHLON SZ3 (Joint) ×2 IMPLANT
COMPONENT FEMRL TRIATHLON SZ3 (Joint) ×1 IMPLANT
CONT SPEC 4OZ STRL OR WHT (MISCELLANEOUS) ×1
COOLER ICEMAN CLASSIC (MISCELLANEOUS) ×2 IMPLANT
COVER SURGICAL LIGHT HANDLE (MISCELLANEOUS) ×2 IMPLANT
COVER WAND RF STERILE (DRAPES) ×2 IMPLANT
CUFF TOURN SGL QUICK 34 (TOURNIQUET CUFF) ×1
CUFF TOURN SGL QUICK 42 (TOURNIQUET CUFF) IMPLANT
CUFF TRNQT CYL 34X4.125X (TOURNIQUET CUFF) ×1 IMPLANT
DECANTER SPIKE VIAL GLASS SM (MISCELLANEOUS) ×2 IMPLANT
DRAPE INCISE IOBAN 66X45 STRL (DRAPES) IMPLANT
DRAPE ORTHO SPLIT 77X108 STRL (DRAPES) ×4
DRAPE SURG ORHT 6 SPLT 77X108 (DRAPES) ×4 IMPLANT
DRAPE U-SHAPE 47X51 STRL (DRAPES) ×2 IMPLANT
DRSG AQUACEL AG ADV 3.5X14 (GAUZE/BANDAGES/DRESSINGS) ×2 IMPLANT
DURAPREP 26ML APPLICATOR (WOUND CARE) ×4 IMPLANT
ELECT CAUTERY BLADE 6.4 (BLADE) ×2 IMPLANT
ELECT REM PT RETURN 9FT ADLT (ELECTROSURGICAL) ×2
ELECTRODE REM PT RTRN 9FT ADLT (ELECTROSURGICAL) ×1 IMPLANT
GAUZE SPONGE 4X4 12PLY STRL (GAUZE/BANDAGES/DRESSINGS) ×2 IMPLANT
GLOVE BIOGEL PI IND STRL 7.0 (GLOVE) ×1 IMPLANT
GLOVE BIOGEL PI INDICATOR 7.0 (GLOVE) ×1
GLOVE ECLIPSE 7.0 STRL STRAW (GLOVE) ×2 IMPLANT
GLOVE ECLIPSE 8.0 STRL XLNG CF (GLOVE) ×2 IMPLANT
GLOVE SRG 8 PF TXTR STRL LF DI (GLOVE) ×1 IMPLANT
GLOVE SURG UNDER POLY LF SZ8 (GLOVE) ×1
GOWN STRL REUS W/ TWL LRG LVL3 (GOWN DISPOSABLE) ×4 IMPLANT
GOWN STRL REUS W/ TWL XL LVL3 (GOWN DISPOSABLE) ×1 IMPLANT
GOWN STRL REUS W/TWL LRG LVL3 (GOWN DISPOSABLE) ×4
GOWN STRL REUS W/TWL XL LVL3 (GOWN DISPOSABLE) ×1
HANDPIECE INTERPULSE COAX TIP (DISPOSABLE) ×1
HOOD PEEL AWAY FLYTE STAYCOOL (MISCELLANEOUS) ×10 IMPLANT
IMMOBILIZER KNEE 20 (SOFTGOODS)
IMMOBILIZER KNEE 20 THIGH 36 (SOFTGOODS) IMPLANT
IMMOBILIZER KNEE 22 UNIV (SOFTGOODS) ×2 IMPLANT
IMMOBILIZER KNEE 24 THIGH 36 (MISCELLANEOUS) IMPLANT
IMMOBILIZER KNEE 24 UNIV (MISCELLANEOUS)
INSERT TIB BEARING X3 SZ4 11 (Joint) ×2 IMPLANT
KIT BASIN OR (CUSTOM PROCEDURE TRAY) ×2 IMPLANT
KIT TURNOVER KIT B (KITS) ×2 IMPLANT
KNEE PATELLA ASYMMETRIC 10X32 (Knees) ×2 IMPLANT
KNEE TIBIAL COMP TRI SZ4 (Knees) ×2 IMPLANT
MANIFOLD NEPTUNE II (INSTRUMENTS) ×2 IMPLANT
NEEDLE 22X1 1/2 (OR ONLY) (NEEDLE) ×4 IMPLANT
NEEDLE SPNL 18GX3.5 QUINCKE PK (NEEDLE) ×2 IMPLANT
NS IRRIG 1000ML POUR BTL (IV SOLUTION) ×4 IMPLANT
PACK TOTAL JOINT (CUSTOM PROCEDURE TRAY) ×2 IMPLANT
PAD ARMBOARD 7.5X6 YLW CONV (MISCELLANEOUS) ×4 IMPLANT
PAD CAST 4YDX4 CTTN HI CHSV (CAST SUPPLIES) ×1 IMPLANT
PADDING CAST ABS 6INX4YD NS (CAST SUPPLIES) ×1
PADDING CAST ABS COTTON 6X4 NS (CAST SUPPLIES) ×1 IMPLANT
PADDING CAST COTTON 4X4 STRL (CAST SUPPLIES) ×1
PADDING CAST COTTON 6X4 STRL (CAST SUPPLIES) ×2 IMPLANT
PIN FLUTED HEDLESS FIX 3.5X1/8 (PIN) ×2 IMPLANT
SET HNDPC FAN SPRY TIP SCT (DISPOSABLE) ×1 IMPLANT
STRIP CLOSURE SKIN 1/2X4 (GAUZE/BANDAGES/DRESSINGS) ×4 IMPLANT
SUCTION FRAZIER HANDLE 10FR (MISCELLANEOUS) ×1
SUCTION TUBE FRAZIER 10FR DISP (MISCELLANEOUS) ×1 IMPLANT
SUT MNCRL AB 3-0 PS2 18 (SUTURE) ×4 IMPLANT
SUT VIC AB 0 CT1 27 (SUTURE) ×3
SUT VIC AB 0 CT1 27XBRD ANBCTR (SUTURE) ×3 IMPLANT
SUT VIC AB 1 CT1 27 (SUTURE) ×5
SUT VIC AB 1 CT1 27XBRD ANBCTR (SUTURE) ×5 IMPLANT
SUT VIC AB 2-0 CT1 27 (SUTURE) ×4
SUT VIC AB 2-0 CT1 TAPERPNT 27 (SUTURE) ×4 IMPLANT
SYR 30ML LL (SYRINGE) ×10 IMPLANT
SYR TB 1ML LUER SLIP (SYRINGE) ×2 IMPLANT
TOWEL GREEN STERILE (TOWEL DISPOSABLE) ×4 IMPLANT
TOWEL GREEN STERILE FF (TOWEL DISPOSABLE) ×4 IMPLANT
TRAY CATH 16FR W/PLASTIC CATH (SET/KITS/TRAYS/PACK) IMPLANT
TRAY CATH INTERMITTENT SS 16FR (CATHETERS) ×4 IMPLANT
WATER STERILE IRR 1000ML POUR (IV SOLUTION) IMPLANT

## 2021-01-16 NOTE — Anesthesia Postprocedure Evaluation (Signed)
Anesthesia Post Note  Patient: William Mcfarland  Procedure(s) Performed: LEFT TOTAL KNEE ARTHROPLASTY (Left Knee)     Patient location during evaluation: PACU Anesthesia Type: Spinal and Regional Level of consciousness: awake and alert Pain management: pain level controlled Vital Signs Assessment: post-procedure vital signs reviewed and stable Respiratory status: spontaneous breathing, nonlabored ventilation, respiratory function stable and patient connected to nasal cannula oxygen Cardiovascular status: stable and blood pressure returned to baseline Postop Assessment: no apparent nausea or vomiting Anesthetic complications: no   No complications documented.  Last Vitals:  Vitals:   01/16/21 2020 01/16/21 2035  BP: 138/85 (!) 148/90  Pulse: 96 85  Resp: 18 20  Temp: (!) 36.2 C 36.6 C  SpO2: 96% 96%    Last Pain:  Vitals:   01/16/21 2149  TempSrc:   PainSc: 8                  Khyron Garno P Alireza Pollack

## 2021-01-16 NOTE — Transfer of Care (Signed)
Immediate Anesthesia Transfer of Care Note  Patient: William Mcfarland  Procedure(s) Performed: LEFT TOTAL KNEE ARTHROPLASTY (Left Knee)  Patient Location: PACU  Anesthesia Type:MAC and Spinal  Level of Consciousness: awake, alert  and oriented  Airway & Oxygen Therapy: Patient Spontanous Breathing  Post-op Assessment: Report given to RN and Post -op Vital signs reviewed and stable  Post vital signs: Reviewed and stable  Last Vitals:  Vitals Value Taken Time  BP 133/79 01/16/21 1849  Temp    Pulse 100 01/16/21 1849  Resp 22 01/16/21 1849  SpO2 96 % 01/16/21 1849  Vitals shown include unvalidated device data.  Last Pain:  Vitals:   01/16/21 1314  TempSrc:   PainSc: 5       Patients Stated Pain Goal: 2 (18/98/42 1031)  Complications: No complications documented.

## 2021-01-16 NOTE — H&P (Signed)
TOTAL KNEE ADMISSION H&P  Patient is being admitted for left total knee arthroplasty.  Subjective:  Chief Complaint:left knee pain.  HPI: William Mcfarland, 65 y.o. male, has a history of pain and functional disability in the left knee due to arthritis and has failed non-surgical conservative treatments for greater than 12 weeks to includeNSAID's and/or analgesics, corticosteriod injections, viscosupplementation injections and activity modification.  Onset of symptoms was gradual, starting >10 years ago with gradually worsening course since that time. The patient noted Multiple surgeries on that left leg due to open fracture and crush injury with retained tibial nail present and no recent infection symptoms on the left knee(s).  Patient currently rates pain in the left knee(s) at 9 out of 10 with activity. Patient has night pain, worsening of pain with activity and weight bearing, pain that interferes with activities of daily living, pain with passive range of motion, crepitus and joint swelling.  Patient has evidence of subchondral sclerosis and joint space narrowing by imaging studies. This patient has had A discussion about nail removal which is not possible at this time.  Attempt at nail removal was made elsewhere 15 years ago and was not successful.  It does not appear that the presence of the nail in the anterior proximal aspect of the proximal tibia should interfere with tibial baseplate placement.  Plan extra medullary alignment.. There is no active infection.  Patient Active Problem List   Diagnosis Date Noted  . Gout 07/03/2020  . 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 Healthcare maintenance 06/03/2019  . Human immunodeficiency virus (HIV) disease ([place]) 05/03/2019  . Type 2 diabetes mellitus with hyperglycemia, without long-term current use of insulin ([place]) 05/03/2019  . Essential hypertension 05/03/2019  . Class 1 obesity due to excess calories with serious comorbidity and body  mass index (BMI) of 32.0 to 32.9 in adult 05/03/2019  . Degenerative arthritis of shoulder region 05/21/2016  . Malignant neoplasm of prostate ([place]) 11/20/2014  . Travel advice encounter 04/01/2014   Past Medical History:  Diagnosis Date  . Arthritis    shoulders, knees, hips  . At risk for sleep apnea    STOP-BANG= 5     SENT TO PCP 04-12-2015  . History of colon polyps   . History of radiation therapy 45 Gy plus seed implants on 04-13-2015   prostate external beam 02-20-2015 to 03-24-2015  . Hyperlipidemia   . Hypertension   . Lower urinary tract symptoms (LUTS)   . Prostate cancer [facility]) urologist-  dr ottelin/  oncologist- dr Tammi Klippel   T1c, Gleason 4+3,  PSA 6.83,  vol. 32.56cc--  s/p  external beam radiation 02-20-2015 to 03-24-2015  . Type 2 diabetes mellitus ([place])   . Type 2 diabetes, diet controlled ([place])   . Wears glasses     Past Surgical History:  Procedure Laterality Date  . COLONOSCOPY W/ POLYPECTOMY  2013  . EYE SURGERY     Cornea replacement left eye (2018)  . JOINT REPLACEMENT    . ORIF LEFT LEG FX  1988  . ORIF RIGHT ANKLE FX  1985  . PROSTATE BIOPSY    . RADIOACTIVE SEED IMPLANT N/A 04/14/2015   Procedure: RADIOACTIVE SEED IMPLANT;  Surgeon: Kathie Rhodes, MD;  [facility];  Service: Urology;  Laterality: N/A;   64     seeds implanted no seeds found in bladder  . TOTAL SHOULDER ARTHROPLASTY Right 05/21/2016   Procedure: TOTAL SHOULDER ARTHROPLASTY;  Surgeon: Meredith Pel, MD;  Location: [facility];  Service: Orthopedics;  Laterality: Right;  . TOTAL SHOULDER ARTHROPLASTY Left 10/05/2019   Procedure: LEFT Total shoulder arthroplasty;  Surgeon: Meredith Pel, MD;  Location: [place];  Service: Orthopedics;  Laterality: Left;    Current Facility-Administered Medications  Medication Dose Route Frequency Provider Last Rate Last Admin  . bupivacaine liposome (EXPAREL) 1.3 % injection 266 mg  20 mL Infiltration To OR Meredith Pel, MD       . ceFAZolin (ANCEF) 2-4 GM/100ML-% IVPB           . [START ON 01/17/2021] ceFAZolin (ANCEF) IVPB 2g/100 mL premix  2 g Intravenous On Call to OR Magnant, Gerrianne Scale, PA-C      . lactated ringers infusion   Intravenous Continuous Myrtie Soman, MD 10 mL/hr at 01/16/21 1357 New Bag at 01/16/21 1357  . povidone-iodine (BETADINE) 7.5 % scrub   Topical Once Magnant, Charles L, PA-C      . povidone-iodine 10 % swab 2 application  2 application Topical Once Magnant, Charles L, PA-C      . tranexamic acid (CYKLOKAPRON) 1000MG /178mL IVPB           . tranexamic acid (CYKLOKAPRON) 2,000 mg in sodium chloride 0.9 % 50 mL Topical Application  5,176 mg Topical To OR Meredith Pel, MD      . tranexamic acid (CYKLOKAPRON) IVPB 1,000 mg  1,000 mg Intravenous To OR Magnant, Charles L, PA-C       Facility-Administered Medications Ordered in Other Encounters  Medication Dose Route Frequency Provider Last Rate Last Admin  . dexamethasone (DECADRON) injection   Infiltration Anesthesia Intra-op Duane Boston, MD   10 mg at 01/16/21 1434  . ropivacaine (PF) 7.5 mg/mL (0.75%) (NAROPIN) injection   Peri-NEURAL Anesthesia Intra-op Duane Boston, MD   20 mL at 01/16/21 1434   No Known Allergies  Social History   Tobacco Use  . Smoking status: Former Smoker    Packs/day: 0.50    Years: 15.00    Pack years: 7.50    Types: Cigarettes    Quit date: 04/12/1999    Years since quitting: 21.7  . Smokeless tobacco: Never Used  Substance Use Topics  . Alcohol use: Yes    Comment: occasional    Family History  Problem Relation Age of Onset  . Stroke Father   . Stroke Mother   . Cancer Neg Hx      Review of Systems  Musculoskeletal: Positive for arthralgias.  All other systems reviewed and are negative.   Objective:  Physical Exam Vitals reviewed.  HENT:     Head: Normocephalic.     Nose: Nose normal.     Mouth/Throat:     Mouth: Mucous membranes are moist.  Eyes:     Pupils: Pupils are equal,  round, and reactive to light.  Cardiovascular:     Rate and Rhythm: Normal rate.     Pulses: Normal pulses.  Pulmonary:     Effort: Pulmonary effort is normal.  Abdominal:     General: Abdomen is flat.  Musculoskeletal:     Cervical back: Normal range of motion.  Skin:    General: Skin is warm.     Capillary Refill: Capillary refill takes less than 2 seconds.  Neurological:     General: No focal deficit present.     Mental Status: He is alert.  Psychiatric:        Mood and Affect: Mood normal.   Examination of the left  knee demonstrates multiple prior incisions from compartment release and fracture fixation.  No incisions around the knee except for entry site for the nail.  Nail itself is not palpable or prominent although the tibial tubercle is palpable.  Extensor mechanism is intact.  Slight varus alignment is present.  Collaterals are stable to varus noted stress at 0 and 30 degrees.  Slight groin pain with internal extra rotation of that left leg. Vital signs in last 24 hours: Temp:  [98.7 F (37.1 C)] 98.7 F (37.1 C) (02/01 1306) Pulse Rate:  [96] 96 (02/01 1306) Resp:  [18] 18 (02/01 1306) SpO2:  [97 %] 97 % (02/01 1306) Weight:  [96.2 kg] 96.2 kg (02/01 1306)  Labs:   Estimated body mass index is 34.22 kg/m as calculated from the following:   Height as of this encounter: 5\' 6"  (1.676 m).   Weight as of this encounter: 96.2 kg.   Imaging Review Plain radiographs demonstrate severe degenerative joint disease of the left knee(s). The overall alignment ismild varus. The bone quality appears to be good for age and reported activity level.      Assessment/Plan:  End stage arthritis, left knee   The patient history, physical examination, clinical judgment of the provider and imaging studies are consistent with end stage degenerative joint disease of the left knee(s) and total knee arthroplasty is deemed medically necessary. The treatment options including medical  management, injection therapy arthroscopy and arthroplasty were discussed at length. The risks and benefits of total knee arthroplasty were presented and reviewed. The risks due to aseptic loosening, infection, stiffness, patella tracking problems, thromboembolic complications and other imponderables were discussed. The patient acknowledged the explanation, agreed to proceed with the plan and consent was signed. Patient is being admitted for inpatient treatment for surgery, pain control, PT, OT, prophylactic antibiotics, VTE prophylaxis, progressive ambulation and ADL's and discharge planning. The patient is planning to be discharged home with home health services     Patient's anticipated LOS is less than 2 midnights, meeting these requirements: - Younger than 43 - Lives within 1 hour of care - Has a competent adult at home to recover with post-op recover - NO history of  - Chronic pain requiring opiods  - Diabetes  - Coronary Artery Disease  - Heart failure  - Heart attack  - Stroke  - DVT/VTE  - Cardiac arrhythmia  - Respiratory Failure/COPD  - Renal failure  - Anemia  - Advanced Liver disease

## 2021-01-16 NOTE — Anesthesia Procedure Notes (Signed)
Anesthesia Regional Block: Adductor canal block   Pre-Anesthetic Checklist: ,, timeout performed, Correct Patient, Correct Site, Correct Laterality, Correct Procedure, Correct Position, site marked, Risks and benefits discussed,  Surgical consent,  Pre-op evaluation,  At surgeon's request and post-op pain management  Laterality: Left  Prep: chloraprep       Needles:  Injection technique: Single-shot  Needle Type: Stimulator Needle - 80     Needle Length: 10cm  Needle Gauge: 21     Additional Needles:   Narrative:  Start time: 01/16/2021 2:25 PM End time: 01/16/2021 2:34 PM Injection made incrementally with aspirations every 5 mL.  Performed by: Personally

## 2021-01-16 NOTE — Progress Notes (Signed)
Orthopedic Tech Progress Note Patient Details:  William Mcfarland 1956/11/23 [id number]  CPM Left Knee CPM Left Knee: On Left Knee Flexion (Degrees): 40 Left Knee Extension (Degrees): 10  Post Interventions Patient Tolerated: Well Instructions Provided: Adjustment of device,Care of device Ortho Devices Type of Ortho Device: Bone foam zero knee Ortho Device/Splint Interventions: Ordered   Post Interventions Patient Tolerated: Well Instructions Provided: Adjustment of device,Care of device   [name] 01/16/2021, 9:47 PM

## 2021-01-16 NOTE — Anesthesia Procedure Notes (Signed)
Procedure Name: MAC Date/Time: 01/16/2021 3:49 PM Performed by: Jearld Pies, CRNA Pre-anesthesia Checklist: Patient identified, Emergency Drugs available, Suction available, Patient being monitored and Timeout performed Patient Re-evaluated:Patient Re-evaluated prior to induction Oxygen Delivery Method: Simple face mask Preoxygenation: Pre-oxygenation with 100% oxygen

## 2021-01-16 NOTE — Telephone Encounter (Signed)
Pls advise.  

## 2021-01-16 NOTE — Anesthesia Preprocedure Evaluation (Addendum)
Anesthesia Evaluation  Patient identified by MRN, date of birth, ID band Patient awake    Reviewed: Allergy & Precautions, NPO status , Patient's Chart, lab work & pertinent test results  History of Anesthesia Complications Negative for: history of anesthetic complications  Airway Mallampati: II  TM Distance: >3 FB Neck ROM: Full    Dental no notable dental hx. (+) Dental Advisory Given   Pulmonary former smoker,    Pulmonary exam normal        Cardiovascular hypertension, Pt. on medications Normal cardiovascular exam     Neuro/Psych negative neurological ROS  negative psych ROS   GI/Hepatic negative GI ROS, Neg liver ROS,   Endo/Other  diabetes  Renal/GU negative Renal ROS     Musculoskeletal   Abdominal   Peds  Hematology   Anesthesia Other Findings   Reproductive/Obstetrics                            Anesthesia Physical Anesthesia Plan  ASA: II  Anesthesia Plan: Spinal   Post-op Pain Management:  Regional for Post-op pain   Induction:   PONV Risk Score and Plan: 2 and Ondansetron, Dexamethasone and Propofol infusion  Airway Management Planned: Natural Airway and Simple Face Mask  Additional Equipment:   Intra-op Plan:   Post-operative Plan:   Informed Consent: I have reviewed the patients History and Physical, chart, labs and discussed the procedure including the risks, benefits and alternatives for the proposed anesthesia with the patient or authorized representative who has indicated his/her understanding and acceptance.     Dental advisory given  Plan Discussed with: CRNA, Anesthesiologist and Surgeon  Anesthesia Plan Comments:         Anesthesia Quick Evaluation                                  Anesthesia Evaluation  Patient identified by MRN, date of birth, ID band Patient awake    Reviewed: Allergy & Precautions, NPO status , Patient's Chart, lab  work & pertinent test results  Airway Mallampati: II  TM Distance: >3 FB Neck ROM: Full    Dental  (+) Teeth Intact, Dental Advisory Given, Implants,    Pulmonary former smoker,    Pulmonary exam normal breath sounds clear to auscultation       Cardiovascular hypertension, Pt. on medications Normal cardiovascular exam Rhythm:Regular Rate:Normal     Neuro/Psych negative neurological ROS     GI/Hepatic negative GI ROS, Neg liver ROS,   Endo/Other  diabetes, Type 2, Oral Hypoglycemic AgentsObesity   Renal/GU negative Renal ROS   Prostate cancer     Musculoskeletal  (+) Arthritis , Osteoarthritis,    Abdominal   Peds  Hematology negative hematology ROS (+)   Anesthesia Other Findings Day of surgery medications reviewed with the patient.  Reproductive/Obstetrics                            Anesthesia Physical Anesthesia Plan  ASA: III  Anesthesia Plan: General   Post-op Pain Management:  Regional for Post-op pain   Induction: Intravenous  PONV Risk Score and Plan: 2 and Midazolam, Dexamethasone and Ondansetron  Airway Management Planned: Oral ETT  Additional Equipment:   Intra-op Plan:   Post-operative Plan: Extubation in OR  Informed Consent: I have reviewed the patients History and Physical, chart, labs and  discussed the procedure including the risks, benefits and alternatives for the proposed anesthesia with the patient or authorized representative who has indicated his/her understanding and acceptance.     Dental advisory given  Plan Discussed with: CRNA  Anesthesia Plan Comments:        Anesthesia Quick Evaluation

## 2021-01-16 NOTE — Anesthesia Procedure Notes (Signed)
Spinal  Patient location during procedure: OR Start time: 01/16/2021 3:49 PM End time: 01/16/2021 3:58 PM Staffing Performed: anesthesiologist  Anesthesiologist: Duane Boston, MD Preanesthetic Checklist Completed: patient identified, IV checked, site marked, risks and benefits discussed, surgical consent, monitors and equipment checked, pre-op evaluation and timeout performed Spinal Block Patient position: sitting Prep: DuraPrep Patient monitoring: heart rate, cardiac monitor, continuous pulse ox and blood pressure Approach: midline Location: L2-3 Injection technique: single-shot Needle Needle type: Sprotte  Needle gauge: 24 G Needle length: 9 cm Assessment Sensory level: T4 Additional Notes Functioning IV was confirmed and monitors were applied. Sterile prep and drape, including hand hygiene and sterile gloves were used. The patient was positioned and the spine was prepped. The skin was anesthetized with lidocaine.  Free flow of clear CSF was obtained prior to injecting local anesthetic into the CSF.  The spinal needle aspirated freely following injection.  The needle was carefully withdrawn.  The patient tolerated the procedure well.

## 2021-01-16 NOTE — Brief Op Note (Signed)
   01/16/2021  6:07 PM  PATIENT:  William Mcfarland  65 y.o. male  PRE-OPERATIVE DIAGNOSIS:  left knee osteoarthritis  POST-OPERATIVE DIAGNOSIS:  left knee osteoarthritis  PROCEDURE:  Procedure(s): LEFT TOTAL KNEE ARTHROPLASTY  SURGEON:  Surgeon(s): Meredith Pel, MD  ASSISTANT:magnant pa  ANESTHESIA:   spinal  EBL: 50 ml    Total I/O In: 1450 [I.V.:1000; IV Piggyback:450] Out: 25 [Blood:25]  BLOOD ADMINISTERED: none  DRAINS: none   LOCAL MEDICATIONS USED: Marcaine morphine clonidine Exparel  SPECIMEN:  No Specimen  COUNTS:  YES  TOURNIQUET:   Total Tourniquet Time Documented: Thigh (Left) - 81 minutes Total: Thigh (Left) - 81 minutes   DICTATION: .Other Dictation: Dictation Number [id number]  PLAN OF CARE: Discharge to home after PACU  PATIENT DISPOSITION:  PACU - hemodynamically stable

## 2021-01-17 ENCOUNTER — Inpatient Hospital Stay: Payer: BC Managed Care – PPO | Admitting: Orthopedic Surgery

## 2021-01-17 DIAGNOSIS — M1712 Unilateral primary osteoarthritis, left knee: Secondary | ICD-10-CM

## 2021-01-17 LAB — GLUCOSE, CAPILLARY: Glucose-Capillary: 182 mg/dL — ABNORMAL HIGH (ref 70–99)

## 2021-01-17 NOTE — Evaluation (Signed)
Physical Therapy Evaluation Patient Details Name: William Mcfarland MRN: [id number] DOB: [birth year] Today's Date: 01/17/2021   History of Present Illness  Pt is a 65 y/o male who presents s/p L TKR on 01/16/2021. PMH significant for DM II, prostate CA, HTN, bilateral total shoulder arthroplasties.    Clinical Impression  This patient presents with acute pain and decreased functional independence following the above mentioned procedure. At the time of PT eval, pt was able to perform transfers and ambulation with gross supervision for safety and RW for support. HEP provided and reviewed, and pt demonstrated good control of L quad with minimal extensor lag during AROM exercise. Pt anticipates d/c home today with a friend to assist him. This patient is appropriate for skilled PT interventions to address functional limitations, improve safety and independence with functional mobility, and return to PLOF.     Follow Up Recommendations Home health PT;Supervision - Intermittent    Equipment Recommendations  None recommended by PT    Recommendations for Other Services       Precautions / Restrictions Precautions Precautions: Fall;Knee Precaution Comments: Reviewed positioning recommendations of NO pillow, roll, or ice pack resting under the knee. Restrictions Weight Bearing Restrictions: Yes LLE Weight Bearing: Weight bearing as tolerated      Mobility  Bed Mobility Overal bed mobility: Needs Assistance Bed Mobility: Supine to Sit     Supine to sit: Supervision     General bed mobility comments: Increased time and effort required for transition to EOB. No assist required.    Transfers Overall transfer level: Needs assistance Equipment used: Rolling walker (2 wheeled) Transfers: Sit to/from Stand Sit to Stand: Supervision         General transfer comment: Light supervision for power-up to full stand. VC's for hand placement on seated surface for  safety.  Ambulation/Gait Ambulation/Gait assistance: Supervision;Modified independent (Device/Increase time) Gait Distance (Feet): 400 Feet Assistive device: Rolling walker (2 wheeled) Gait Pattern/deviations: Step-through pattern;Decreased stride length;Decreased dorsiflexion - left;Decreased weight shift to left Gait velocity: Decreased Gait velocity interpretation: <1.31 ft/sec, indicative of household ambulator General Gait Details: VC's for improved posture, closer walker proximity and forward gaze. Pt able to make corrective changes and improve heel strike as well as weight shift on the L with cues as well. Overall pt ambulating well with walker for support.  Stairs Stairs:  (Deferred as pt does not have stairs at home.)          Wheelchair Mobility    Modified Rankin (Stroke Patients Only)       Balance Overall balance assessment: Mild deficits observed, not formally tested                                           Pertinent Vitals/Pain Pain Assessment: 0-10 Pain Score: 5  Pain Location: L knee Pain Descriptors / Indicators: Operative site guarding;Aching Pain Intervention(s): Limited activity within patient's tolerance;Monitored during session;Repositioned    Home Living Family/patient expects to be discharged to:: Private residence Living Arrangements: Alone Available Help at Discharge: Friend(s);Available PRN/intermittently Type of Home: Apartment Home Access: Level entry     Home Layout: One level Home Equipment: Walker - 2 wheels;Bedside commode      Prior Function Level of Independence: Independent               Hand Dominance   Dominant Hand: Right    Extremity/Trunk Assessment  Upper Extremity Assessment Upper Extremity Assessment: Defer to OT evaluation    Lower Extremity Assessment Lower Extremity Assessment: RLE deficits/detail RLE Deficits / Details: Decreased strength and AROM consistent with above mentioned  procedure.    Cervical / Trunk Assessment Cervical / Trunk Assessment: Normal  Communication   Communication: No difficulties  Cognition Arousal/Alertness: Awake/alert Behavior During Therapy: WFL for tasks assessed/performed Overall Cognitive Status: Within Functional Limits for tasks assessed                                        General Comments      Exercises Total Joint Exercises Ankle Circles/Pumps: 10 reps Quad Sets: 10 reps Heel Slides: 10 reps Hip ABduction/ADduction: 10 reps Straight Leg Raises: 10 reps Long Arc Quad: 10 reps Goniometric ROM: 2-88 AAROM in supine (L knee)   Assessment/Plan    PT Assessment Patient needs continued PT services  PT Problem List Decreased strength;Decreased range of motion;Decreased activity tolerance;Decreased balance;Decreased mobility;Decreased knowledge of use of DME;Decreased safety awareness;Decreased knowledge of precautions;Pain       PT Treatment Interventions DME instruction;Gait training;Functional mobility training;Therapeutic activities;Therapeutic exercise;Neuromuscular re-education;Patient/family education    PT Goals (Current goals can be found in the Care Plan section)  Acute Rehab PT Goals Patient Stated Goal: Home today PT Goal Formulation: With patient Time For Goal Achievement: 01/24/21 Potential to Achieve Goals: Good    Frequency 7X/week   Barriers to discharge        Co-evaluation               AM-PAC PT "6 Clicks" Mobility  Outcome Measure Help needed turning from your back to your side while in a flat bed without using bedrails?: None Help needed moving from lying on your back to sitting on the side of a flat bed without using bedrails?: None Help needed moving to and from a bed to a chair (including a wheelchair)?: A Little Help needed standing up from a chair using your arms (e.g., wheelchair or bedside chair)?: A Little Help needed to walk in hospital room?: A  Little Help needed climbing 3-5 steps with a railing? : A Little 6 Click Score: 20    End of Session Equipment Utilized During Treatment: Gait belt Activity Tolerance: Patient tolerated treatment well Patient left: in chair;with call bell/phone within reach (Bone Foam under L heel) Nurse Communication: Mobility status PT Visit Diagnosis: Unsteadiness on feet (R26.81);Pain Pain - Right/Left: Left Pain - part of body: Knee    Time: 0903-1003 PT Time Calculation (min) (ACUTE ONLY): 60 min   Charges:   PT Evaluation $PT Eval Moderate Complexity: 1 Mod PT Treatments $Gait Training: 23-37 mins $Therapeutic Exercise: 8-22 mins        Rolinda Roan, PT, DPT Acute Rehabilitation Services Pager: [phone] Office: [phone]   Thelma Comp 01/17/2021, 11:44 AM

## 2021-01-17 NOTE — Progress Notes (Signed)
Patient is postop day 1 s/p left total knee arthroplasty.  Reports he is doing well and pain is controlled.  Ace wrap removed.  He is using CPM machine in the bed and is up to 65 degrees of flexion.  2+ DP pulse of the left lower extremity and ankle dorsiflexion intact.  Able to perform multiple straight leg raises with minimal extensor lag.  Postop dressing in place with no gross blood or drainage.  Overall he is doing very well.  Plan to work with physical therapy this morning and discharge home late this morning after therapy session, barring any setbacks.  Patient agreed with this plan.  Discharge orders will be put in.

## 2021-01-17 NOTE — Op Note (Signed)
NAME: KEMP, GOMES MEDICAL RECORD ZH:[id number] ACCOUNT [id number] DATE OF BIRTH:1956/02/09 FACILITY: [facility] LOCATION: [facility] PHYSICIAN:Liley Rake Randel Pigg, MD  OPERATIVE REPORT  DATE OF PROCEDURE:  01/16/2021  PREOPERATIVE DIAGNOSIS:  Left knee arthritis.  POSTOPERATIVE DIAGNOSIS:  Left knee arthritis.  PROCEDURE:  Left total knee replacement using press-fit components, size 3 femur, 4 tibia, 32 mm 3-peg press-fit patella.  All posterior cruciate retaining with 11 mm deep dish polyethylene insert.  SURGEON ATTENDING:  Meredith Pel, MD  ASSISTANT:  Annie Main, PA.  INDICATIONS:  The patient is a 65 year old patient with end-stage left knee arthritis, who presents for operative management after explanation of risks and benefits.  PROCEDURE IN DETAIL:  The patient was brought to the operating room where spinal anesthetic was induced.  Preoperative antibiotics administered.  Timeout was called.  Left leg was prescrubbed with alcohol and Betadine, allowed to air dry,  prepped with  DuraPrep solution and draped in sterile manner.  Ioban used to cover the operative field.  The leg was elevated, exsanguinated with the Esmarch wrap.  Tourniquet was inflated.  Timeout was called.  Anterior approach to knee was made.  Previous incision  was utilized distally.  Skin and subcutaneous tissue were sharply divided.  Median parapatellar approach was made.  IrriSept solution used at that time as well as after the arthrotomy at all times during the case to diminish infection risk.  Arthrotomy  was marked with #1 Vicryl suture.  Lateral patellofemoral ligament was released.  Fat pad partially excised.  Soft tissue anterior distal was removed.  Minimal soft tissue dissection was performed on the medial aspect of the proximal tibia.  Osteophytes  were removed.  ACL was released.  The patient had generally symmetric wear on both the tibia and the femur.  Next, extramedullary alignment was used to  make a cut on the tibia perpendicular to the mechanical axis and 3 degrees of posterior slope.   Posterior neurovascular structures and collaterals were protected.  Next, the femur was cut initially in 5 degrees of valgus with an 8 mm cut.  This was later revised 2 more millimeters.  Initial cut on the tibia was 9 off the tibial plateau medially  which had the least amount of wear and that was revised 2 more.  With that in place, the patient had full extension and good stability with both a 9 mm spacer, but better with an 11 mm spacer.  Anterior, posterior chamfer cuts were then made after sizing  the femur to a size 3  Tibia was then prepared using a size 4 tibia.  Bone quality was excellent.  Press fit was performed.  Trial components were placed.  Patella was then cut down from 26 to 16 and a 3-peg patellar trial was placed.  The patient had  very good patellar tracking using no thumbs technique and good stability to varus and valgus stress at 0, 30 and 90 degrees with an 11 mm spacer in place.  Final preparation was then performed on the femur and then on the tibia.  The intramedullary nail  did not have a problem in terms of being in the way.  Next, tranexamic acid sponge was allowed to sit in the incision for 3 minutes along with IrriSept solution.  This was removed and the components were then tapped into position with excellent press fit  obtained, same stability parameters maintained.  Tourniquet released.  Bleeding points encountered controlled using electrocautery.  Thorough irrigation was performed.  Arthrotomy  closed using #1 Vicryl suture followed by IrriSept solution within the  arthrotomy followed by vancomycin powder.  A solution of Marcaine, morphine, clonidine then injected into the knee joint after that was performed.  Next, incision was closed using 0 Vicryl suture, 2-0 Vicryl suture and 3-0 Monocryl along with  Steri-Strips and Aquacel dressing and knee immobilizer.  Alignment was good.   The patient tolerated the procedure well without immediate complications.  He was transferred to the recovery room in stable condition.  Luke's assistance was required at all  times during the case for retraction, opening and closing, mobilization of tissue.  His assistance was of medical necessity.  IN/NUANCE  D:01/16/2021 T:01/16/2021 JOB:[id number]

## 2021-01-17 NOTE — Plan of Care (Signed)
Patient discharged home per order. Discharged instructions given to patient with stated understanding of instructions given. Patient has an appointment with MD in 2 weeks

## 2021-01-19 ENCOUNTER — Telehealth: Payer: Self-pay | Admitting: *Deleted

## 2021-01-19 NOTE — Telephone Encounter (Signed)
Ok thx.

## 2021-01-19 NOTE — Telephone Encounter (Signed)
[facility] called and just wanted to make you aware that patient had vitals that were outside their parameters during a visit this week and they have to notify the referring provider. Vitals- Resp. Rate=10, Temp. 96.9. Patient doing well with therapy and is asymptomatic otherwise. They had to notify us.

## 2021-01-21 ENCOUNTER — Encounter (HOSPITAL_COMMUNITY): Payer: Self-pay | Admitting: Orthopedic Surgery

## 2021-01-24 ENCOUNTER — Other Ambulatory Visit: Payer: Self-pay | Admitting: Surgical

## 2021-01-24 NOTE — Telephone Encounter (Signed)
Pls advise. Thanks.  

## 2021-01-25 ENCOUNTER — Telehealth: Payer: Self-pay | Admitting: Orthopedic Surgery

## 2021-01-25 NOTE — Discharge Summary (Signed)
Physician Discharge Summary      Patient ID: William Mcfarland MRN: [id number] DOB/AGE: Jul 23, 1956 65 y.o.  Admit date: 01/16/2021 Discharge date: 01/17/21  Admission Diagnoses:  Active Problems:   S/P total knee arthroplasty, left   Discharge Diagnoses:  Same  Surgeries: Procedure(s): LEFT TOTAL KNEE ARTHROPLASTY on 01/16/2021   Consultants:   Discharged Condition: Stable  Hospital Course: William Mcfarland is an 65 y.o. male who was admitted 01/16/2021 with a chief complaint of left knee pain, and found to have a diagnosis of left knee OA.  They were brought to the operating room on 01/16/2021 and underwent the above named procedures.  Pt awoke from anesthesia without complication and was transferred to the floor. On POD1, patient's pain was well-controlled and he mobilized well. He was discharged home on POD1 and will f/u with Dr. Marlou Sa in clinic in ~2 weeks.   Antibiotics given:  Anti-infectives (From admission, onward)   Start     Dose/Rate Route Frequency Ordered Stop   01/17/21 0600  ceFAZolin (ANCEF) IVPB 2g/100 mL premix        2 g 200 mL/hr over 30 Minutes Intravenous On call to O.R. 01/16/21 1254 01/17/21 0630   01/17/21 0000  ceFAZolin (ANCEF) IVPB 2g/100 mL premix        2 g 200 mL/hr over 30 Minutes Intravenous Every 8 hours 01/16/21 2045 01/17/21 0537   01/16/21 1309  ceFAZolin (ANCEF) 2-4 GM/100ML-% IVPB       Note to Pharmacy: Odelia Gage   : cabinet override      01/16/21 1309 01/17/21 0114    .  Recent vital signs:  Vitals:   01/17/21 0329 01/17/21 0729  BP: 123/73 138/82  Pulse: 68 68  Resp: 20 17  Temp: 98.5 F (36.9 C) 97.6 F (36.4 C)  SpO2: 94% 98%    Recent laboratory studies:  Results for orders placed or performed during the hospital encounter of 01/16/21  Glucose, capillary  Result Value Ref Range   Glucose-Capillary 133 (H) 70 - 99 mg/dL  Glucose, capillary  Result Value Ref Range   Glucose-Capillary 218 (H) 70 - 99 mg/dL    Comment 1 Notify RN    Comment 2 Document in Chart   Glucose, capillary  Result Value Ref Range   Glucose-Capillary 307 (H) 70 - 99 mg/dL   Comment 1 Notify RN    Comment 2 Document in Chart   Glucose, capillary  Result Value Ref Range   Glucose-Capillary 182 (H) 70 - 99 mg/dL   Comment 1 Notify RN    Comment 2 Document in Chart     Discharge Medications:   Allergies as of 01/17/2021   No Known Allergies     Medication List    TAKE these medications   aspirin 81 MG chewable tablet Commonly known as: Aspirin Childrens Chew 1 tablet (81 mg total) by mouth daily.   atorvastatin 10 MG tablet Commonly known as: LIPITOR Take 10 mg by mouth daily in the afternoon.   bictegravir-emtricitabine-tenofovir AF 50-200-25 MG Tabs tablet Commonly known as: BIKTARVY Take 1 tablet by mouth daily.   celecoxib 100 MG capsule Commonly known as: CeleBREX Take 1 capsule (100 mg total) by mouth 2 (two) times daily.   diclofenac 1.3 % Ptch Commonly known as: Flector Place 1 patch onto the skin 2 (two) times daily.   gabapentin 300 MG capsule Commonly known as: Neurontin Take 1 capsule (300 mg total) by mouth 3 (three) times daily. What  changed:   when to take this  additional instructions   lisinopril-hydrochlorothiazide 20-12.5 MG tablet Commonly known as: ZESTORETIC Take 1 tablet by mouth 2 (two) times a day.   metFORMIN 500 MG tablet Commonly known as: GLUCOPHAGE Take 500 mg by mouth 2 (two) times daily with a meal.   methocarbamol 500 MG tablet Commonly known as: Robaxin Take 1 tablet (500 mg total) by mouth every 8 (eight) hours as needed.   oxyCODONE-acetaminophen 5-325 MG tablet Commonly known as: Percocet Take 1 tablet by mouth every 4 (four) hours as needed for severe pain.       Diagnostic Studies: No results found.  Disposition: Discharge disposition: 01-Home or Self Care       Discharge Instructions    Call MD / Call 911   Complete by: As directed     If you experience chest pain or shortness of breath, CALL 911 and be transported to the hospital emergency room.  If you develope a fever above 101 F, pus (white drainage) or increased drainage or redness at the wound, or calf pain, call your surgeon's office.   Constipation Prevention   Complete by: As directed    Drink plenty of fluids.  Prune juice may be helpful.  You may use a stool softener, such as Colace (over the counter) 100 mg twice a day.  Use MiraLax (over the counter) for constipation as needed.   Diet - low sodium heart healthy   Complete by: As directed    Discharge instructions   Complete by: As directed    You may shower, dressing is waterproof.  Do not remove the dressing, we will remove it at your first post-op appointment.  Do not take a bath or soak the knee in a tub or pool.  You may weightbear as you can tolerate on the operative leg with a walker.  Use the knee immobilizer when you are walking around. Dont need to use the immobilizer when you are sedentary.  Continue using the CPM machine 3 times per day for one hour each time, increasing the degrees of range of motion daily.  Good to use the ice machine after using CPM machine.  Do not use ice machine for more than 30 mins at a time. Use the blue cradle boot under your heel to work on getting your leg straight.  Do NOT put a pillow under your knee.  You will follow-up with Dr. Marlou Sa in the clinic in 2 weeks at your given appointment date.    Dental Antibiotics:  In most cases prophylactic antibiotics for Dental procdeures after total joint surgery are not necessary.  Exceptions are as follows:  1. History of prior total joint infection  2. Severely immunocompromised (Organ Transplant, cancer chemotherapy, Rheumatoid biologic meds such as Gardners)  3. Poorly controlled diabetes (A1C &gt; 8.0, blood glucose over 200)  If you have one of these conditions, contact your surgeon for an antibiotic prescription, prior to  your dental procedure.   Increase activity slowly as tolerated   Complete by: As directed          Signed: Donella Stade 01/25/2021, 1:05 AM

## 2021-01-25 NOTE — Telephone Encounter (Signed)
Tried calling. No answer. LMVM advising per below.

## 2021-01-25 NOTE — Telephone Encounter (Signed)
Pls advise.  

## 2021-01-25 NOTE — Telephone Encounter (Signed)
Pt called stating he has a post op visit on 01/31/21 and he's been doing his home PT but the therapist was wondering if Dr. Marlou Sa planned to do any PT when he came in for his visit on 01/31/21? If not should the PT still come in the morning on the 16th? Pt would like a CB  [phone]

## 2021-01-25 NOTE — Telephone Encounter (Signed)
We will refer him to PT on that date but he will not do any PT on 2/16 in our office. Okay for hhpt that day

## 2021-01-28 DIAGNOSIS — M1712 Unilateral primary osteoarthritis, left knee: Secondary | ICD-10-CM

## 2021-01-31 ENCOUNTER — Ambulatory Visit (INDEPENDENT_AMBULATORY_CARE_PROVIDER_SITE_OTHER): Payer: BC Managed Care – PPO | Admitting: Orthopedic Surgery

## 2021-01-31 ENCOUNTER — Ambulatory Visit (INDEPENDENT_AMBULATORY_CARE_PROVIDER_SITE_OTHER): Payer: BC Managed Care – PPO

## 2021-01-31 ENCOUNTER — Encounter: Payer: Self-pay | Admitting: Orthopedic Surgery

## 2021-01-31 DIAGNOSIS — Z96652 Presence of left artificial knee joint: Secondary | ICD-10-CM

## 2021-01-31 MED ORDER — OXYCODONE-ACETAMINOPHEN 5-325 MG PO TABS: 1.0000 | ORAL_TABLET | Freq: Three times a day (TID) | ORAL | 0 refills | Status: DC | PRN

## 2021-01-31 NOTE — Progress Notes (Signed)
Post-Op Visit Note   Patient: William Mcfarland           Date of Birth: 1956/02/18           MRN: [id number] Visit Date: 01/31/2021 PCP: Kathyrn Lass, MD   Assessment & Plan:  Chief Complaint:  Chief Complaint  Patient presents with  . Left Knee - Routine Post Op   Visit Diagnoses:  1. Status post total left knee replacement     Plan: [name] is a 65 year old patient is now 2 weeks out left total knee replacement.  Doing well.  CPM 105.  No calf tenderness negative Homans.  Incision intact.  Radiographs look good.  Transition to outpatient PT and return office visit in 4 weeks.  Follow-Up Instructions: Return in about 4 weeks (around 02/28/2021).   Orders:  Orders Placed This Encounter  Procedures  . XR Knee 1-2 Views Left   No orders of the defined types were placed in this encounter.   Imaging: XR Knee 1-2 Views Left  Result Date: 01/31/2021 AP lateral left knee reviewed.  Total knee prosthesis in good position alignment.  No complicating features.  Adjacent to but not abutting the proximal aspect of the tibial nail.   PMFS History: Patient Active Problem List   Diagnosis Date Noted  . Arthritis of left knee   . S/P total knee arthroplasty, left 01/16/2021  . Gout 07/03/2020  . 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 Healthcare maintenance 06/03/2019  . Human immunodeficiency virus (HIV) disease ([place]) 05/03/2019  . Type 2 diabetes mellitus with hyperglycemia, without long-term current use of insulin ([place]) 05/03/2019  . Essential hypertension 05/03/2019  . Class 1 obesity due to excess calories with serious comorbidity and body mass index (BMI) of 32.0 to 32.9 in adult 05/03/2019  . Degenerative arthritis of shoulder region 05/21/2016  . Malignant neoplasm of prostate ([place]) 11/20/2014  . Travel advice encounter 04/01/2014   Past Medical History:  Diagnosis Date  . Arthritis    shoulders, knees, hips  . At risk for sleep apnea    STOP-BANG= 5      SENT TO PCP 04-12-2015  . History of colon polyps   . History of radiation therapy 45 Gy plus seed implants on 04-13-2015   prostate external beam 02-20-2015 to 03-24-2015  . Hyperlipidemia   . Hypertension   . Lower urinary tract symptoms (LUTS)   . Prostate cancer [facility]) urologist-  dr ottelin/  oncologist- dr Tammi Klippel   T1c, Gleason 4+3,  PSA 6.83,  vol. 32.56cc--  s/p  external beam radiation 02-20-2015 to 03-24-2015  . Type 2 diabetes mellitus ([place])   . Type 2 diabetes, diet controlled ([place])   . Wears glasses     Family History  Problem Relation Age of Onset  . Stroke Father   . Stroke Mother   . Cancer Neg Hx     Past Surgical History:  Procedure Laterality Date  . COLONOSCOPY W/ POLYPECTOMY  2013  . EYE SURGERY     Cornea replacement left eye (2018)  . JOINT REPLACEMENT    . ORIF LEFT LEG FX  1988  . ORIF RIGHT ANKLE FX  1985  . PROSTATE BIOPSY    . RADIOACTIVE SEED IMPLANT N/A 04/14/2015   Procedure: RADIOACTIVE SEED IMPLANT;  Surgeon: Kathie Rhodes, MD;  Location: [facility];  Service: Urology;  Laterality: N/A;   64     seeds implanted no seeds found in bladder  . TOTAL KNEE  ARTHROPLASTY Left 01/16/2021   Procedure: LEFT TOTAL KNEE ARTHROPLASTY;  Surgeon: Meredith Pel, MD;  Location: [place];  Service: Orthopedics;  Laterality: Left;  . TOTAL SHOULDER ARTHROPLASTY Right 05/21/2016   Procedure: TOTAL SHOULDER ARTHROPLASTY;  Surgeon: Meredith Pel, MD;  Location: [place];  Service: Orthopedics;  Laterality: Right;  . TOTAL SHOULDER ARTHROPLASTY Left 10/05/2019   Procedure: LEFT Total shoulder arthroplasty;  Surgeon: Meredith Pel, MD;  Location: [place];  Service: Orthopedics;  Laterality: Left;   Social History   Occupational History  . Occupation: Air traffic controller  Tobacco Use  . Smoking status: Former Smoker    Packs/day: 0.50    Years: 15.00    Pack years: 7.50    Types: Cigarettes    Quit date: 04/12/1999    Years since quitting:  21.8  . Smokeless tobacco: Never Used  Vaping Use  . Vaping Use: Never used  Substance and Sexual Activity  . Alcohol use: Yes    Comment: occasional  . Drug use: No  . Sexual activity: Not on file

## 2021-02-01 ENCOUNTER — Ambulatory Visit: Payer: BC Managed Care – PPO | Admitting: Physical Therapy

## 2021-02-05 ENCOUNTER — Other Ambulatory Visit: Payer: Self-pay

## 2021-02-05 ENCOUNTER — Ambulatory Visit: Payer: BC Managed Care – PPO | Admitting: Rehabilitative and Restorative Service Providers"

## 2021-02-05 ENCOUNTER — Encounter: Payer: Self-pay | Admitting: Rehabilitative and Restorative Service Providers"

## 2021-02-05 DIAGNOSIS — M25562 Pain in left knee: Secondary | ICD-10-CM

## 2021-02-05 DIAGNOSIS — R262 Difficulty in walking, not elsewhere classified: Secondary | ICD-10-CM

## 2021-02-05 DIAGNOSIS — R293 Abnormal posture: Secondary | ICD-10-CM | POA: Diagnosis not present

## 2021-02-05 DIAGNOSIS — M6281 Muscle weakness (generalized): Secondary | ICD-10-CM

## 2021-02-05 DIAGNOSIS — M25512 Pain in left shoulder: Secondary | ICD-10-CM

## 2021-02-05 DIAGNOSIS — M25612 Stiffness of left shoulder, not elsewhere classified: Secondary | ICD-10-CM | POA: Diagnosis not present

## 2021-02-05 DIAGNOSIS — R600 Localized edema: Secondary | ICD-10-CM

## 2021-02-05 NOTE — Therapy (Signed)
[facility] [address] [place], [place], [address] Phone: [phone]   Fax:  [phone]  Physical Therapy Evaluation  Patient Details  Name: William Mcfarland MRN: [id number] Date of Birth: [birth year] Referring Provider (PT): Dr. Marlou Sa   Encounter Date: 02/05/2021   PT End of Session - 02/05/21 1000    Visit Number 1    Number of Visits 16    Date for PT Re-Evaluation 04/16/21    Progress Note Due on Visit 10    PT Start Time 1010    PT Stop Time 1050    PT Time Calculation (min) 40 min    Activity Tolerance Patient tolerated treatment well    Behavior During Therapy [facility] for tasks assessed/performed           Past Medical History:  Diagnosis Date  . Arthritis    shoulders, knees, hips  . At risk for sleep apnea    STOP-BANG= 5     SENT TO PCP 04-12-2015  . History of colon polyps   . History of radiation therapy 45 Gy plus seed implants on 04-13-2015   prostate external beam 02-20-2015 to 03-24-2015  . Hyperlipidemia   . Hypertension   . Lower urinary tract symptoms (LUTS)   . Prostate cancer [facility]) urologist-  dr ottelin/  oncologist- dr Tammi Klippel   T1c, Gleason 4+3,  PSA 6.83,  vol. 32.56cc--  s/p  external beam radiation 02-20-2015 to 03-24-2015  . Type 2 diabetes mellitus ([place])   . Type 2 diabetes, diet controlled ([place])   . Wears glasses     Past Surgical History:  Procedure Laterality Date  . COLONOSCOPY W/ POLYPECTOMY  2013  . EYE SURGERY     Cornea replacement left eye (2018)  . JOINT REPLACEMENT    . ORIF LEFT LEG FX  1988  . ORIF RIGHT ANKLE FX  1985  . PROSTATE BIOPSY    . RADIOACTIVE SEED IMPLANT N/A 04/14/2015   Procedure: RADIOACTIVE SEED IMPLANT;  Surgeon: Kathie Rhodes, MD;  Location: [facility];  Service: Urology;  Laterality: N/A;   64     seeds implanted no seeds found in bladder  . TOTAL KNEE ARTHROPLASTY Left 01/16/2021   Procedure: LEFT TOTAL KNEE ARTHROPLASTY;  Surgeon: Meredith Pel, MD;  Location: [place];  Service: Orthopedics;  Laterality: Left;  . TOTAL SHOULDER ARTHROPLASTY Right 05/21/2016   Procedure: TOTAL SHOULDER ARTHROPLASTY;  Surgeon: Meredith Pel, MD;  Location: [place];  Service: Orthopedics;  Laterality: Right;  . TOTAL SHOULDER ARTHROPLASTY Left 10/05/2019   Procedure: LEFT Total shoulder arthroplasty;  Surgeon: Meredith Pel, MD;  Location: [place];  Service: Orthopedics;  Laterality: Left;    There were no vitals filed for this visit.    Subjective Assessment - 02/05/21 1012    Subjective Pt. indicated surgery 01/16/2021 with home health and CPM.  Pt. stated having trouble sleeping with waking several times but able to get back to sleep eventually. Pt. stated quad cane use in public, no cane at home.  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  Pt. stated stairs in house for laundy room.  Ground floor apartment and has stick shift car.    Pertinent History OA, HTN, prostate cancer, DM, HIV+    Limitations Standing;Walking;House hold activities    Patient Stated Goals Reduce pain, walk independenty, return to work    Currently in Pain? Yes    Pain Score 4  at worst 6/10   Pain Location Knee    Pain Orientation Left    Pain Descriptors / Indicators Aching;Throbbing;Tightness;Sore    Pain Type Surgical pain    Pain Onset 1 to 4 weeks ago    Pain Frequency Constant    Aggravating Factors  end of day, end range complaints, car transfers, squats    Pain Relieving Factors pain medicine at times.    Effect of Pain on Daily Activities Independent walking, ride motorcycle, out of work (ladder climbing)              [facility] PT Assessment - 02/05/21 0001      Assessment   Medical Diagnosis Lt TKA    Referring Provider (PT) Dr. Marlou Sa    Onset Date/Surgical Date 01/16/21    Hand Dominance Right      Precautions   Precautions None      Restrictions   Weight Bearing Restrictions No      Balance Screen    Has the patient fallen in the past 6 months No    Has the patient had a decrease in activity level because of a fear of falling?  No    Is the patient reluctant to leave their home because of a fear of falling?  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      Observation/Other Assessments   Focus on Therapeutic Outcomes (FOTO)  intake 44 %      Functional Tests   Functional tests Single leg stance;Sit to Stand      Single Leg Stance   Comments Lt SLS < 3 seconds, Rt: 5 seconds      Sit to Stand   Comments able to perform after several attempts from 18 inch chair s UE, noted deviation to Rt LE      ROM / Strength   AROM / PROM / Strength Strength;PROM;AROM      AROM   AROM Assessment Site Knee    Right/Left Knee Left;Right    Left Knee Extension -10   in LAQ, supine quad set -4   Left Knee Flexion 75   supine heel slide     PROM   PROM Assessment Site Knee    Right/Left Knee Left;Right    Left Knee Extension -2   supine   Left Knee Flexion 92   supine     Strength   Strength Assessment Site Ankle;Hip;Knee    Right/Left Hip Left;Right    Right Hip Flexion 5/5    Left Hip Flexion 5/5    Right/Left Knee Left;Right    Right Knee Flexion 5/5    Right Knee Extension 5/5    Left Knee Flexion 4+/5    Left Knee Extension 4/5    Right/Left Ankle Left;Right    Right Ankle Dorsiflexion 5/5    Left Ankle Dorsiflexion 5/5      Ambulation/Gait   Gait Pattern Left flexed knee in stance;Antalgic   Lt LE ER noted more than Rt, lacking TKE in swing terminal, reduced stance on Lt LE   Gait Comments Quad cane Rt UE in clinic with periods of carrying.  Objective  measurements completed on examination: See above findings.       [facility] Adult PT Treatment/Exercise - 02/05/21 0001      Exercises   Exercises Other Exercises;Knee/Hip    Other Exercises  HEP instruction/performance c cues for techniques, HEP handout provided.  Trial set of heel slides c strap, supine SLR, heel prop, LAQ for home      Modalities   Modalities Vasopneumatic      Vasopneumatic   Number Minutes Vasopneumatic  10 minutes    Vasopnuematic Location  Knee   Lt   Vasopneumatic Pressure Medium    Vasopneumatic Temperature  34                  PT Education - 02/05/21 1056    Education Details HEP, POC    Person(s) Educated Patient    Methods Explanation;Demonstration;Verbal cues;Handout    Comprehension Verbalized understanding;Returned demonstration;Verbal cues required            PT Short Term Goals - 02/05/21 1003      PT SHORT TERM GOAL #1   Title Patient will demonstrate independent use of home exercise program to maintain progress from in clinic treatments.    Time 3    Period Weeks    Status New    Target Date 02/26/21             PT Long Term Goals - 02/05/21 1004      PT LONG TERM GOAL #1   Title Patient will demonstrate/report pain at worst less than or equal to 2/10 to facilitate minimal limitation in daily activity secondary to pain symptoms.    Time 10    Period Weeks    Status New    Target Date 04/16/21      PT LONG TERM GOAL #2   Title Patient will demonstrate independent use of home exercise program to facilitate ability to maintain/progress functional gains from skilled physical therapy services.    Time 10    Period Weeks    Status New    Target Date 04/16/21      PT LONG TERM GOAL #3   Title Patient will demonstrate independent ambulation community distances > 300 ft to facilitate community integration at [facility].    Time 10    Period Weeks    Status New    Target Date 04/16/21      PT LONG TERM GOAL #4   Title Patient  will demonstrate Lt knee AROM 0-110 degrees to facilitate ability to perform transfers, sitting, ambulation, stair navigation s restriction due to mobility.    Time 10    Period Weeks    Status New    Target Date 04/16/21      PT LONG TERM GOAL #5   Title Patient will demonstrate Lt LE MMT 5/5 throughout to facilitate ability to perform usual standing, walking, stairs at PLOF s limitation due to symptoms.    Time 10    Period Weeks    Status New    Target Date 04/16/21      Additional Long Term Goals   Additional Long Term Goals Yes      PT LONG TERM GOAL #6   Title Pt. will demonstrate FOTO score > 59 to indicated reduced disability.    Time 10    Period Weeks    Status New    Target Date 04/16/21  Plan - 02/05/21 1001    Clinical Impression Statement Patient is a 65 y.o. male who comes to clinic with complaints of Lt knee pain with mobility, strength and movement coordination deficits that impair their ability to perform usual daily and recreational functional activities without increase difficulty/symptoms at this time.  Patient to benefit from skilled PT services to address impairments and limitations to improve to previous level of function without restriction secondary to condition.    Personal Factors and Comorbidities Comorbidity 3+    Comorbidities OA, HTN, prostate cancer, DM, HIV+    Examination-Activity Limitations Sit;Sleep;Squat;Bend;Stairs;Stand;Transfers;Locomotion Level    Stability/Clinical Decision Making Stable/Uncomplicated    Clinical Decision Making Low    Rehab Potential Good    PT Frequency 2x / week   2x/week then transitioning to 1-2x/week as appropraite   PT Duration --   10 weeks   PT Treatment/Interventions ADLs/Self Care Home Management;Cryotherapy;Electrical Stimulation;Moist Heat;Balance training;Therapeutic exercise;Iontophoresis 4mg /ml Dexamethasone;Therapeutic activities;Functional mobility training;Stair training;Gait  training;DME Instruction;Neuromuscular re-education;Patient/family education;Manual techniques;Vasopneumatic Device;Taping;Dry needling;Passive range of motion;Spinal Manipulations;Joint Manipulations    PT Next Visit Plan Reassess HEP, manual for mobility gains, strengthening OKC, CKC.    PT Home Exercise Plan [facility]    Consulted and Agree with Plan of Care Patient           Patient will benefit from skilled therapeutic intervention in order to improve the following deficits and impairments:  Pain,Decreased coordination,Impaired flexibility,Impaired perceived functional ability,Improper body mechanics,Difficulty walking,Decreased mobility,Decreased balance,Decreased range of motion,Decreased strength,Decreased activity tolerance,Increased edema,Hypomobility,Decreased endurance,Abnormal gait  Visit Diagnosis: Left knee pain, unspecified chronicity  Muscle weakness (generalized)  Difficulty in walking, not elsewhere classified  Localized edema     Problem List Patient Active Problem List   Diagnosis Date Noted  . Arthritis of left knee   . S/P total knee arthroplasty, left 01/16/2021  . Gout 07/03/2020  . 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 Healthcare maintenance 06/03/2019  . Human immunodeficiency virus (HIV) disease ([place]) 05/03/2019  . Type 2 diabetes mellitus with hyperglycemia, without long-term current use of insulin ([place]) 05/03/2019  . Essential hypertension 05/03/2019  . Class 1 obesity due to excess calories with serious comorbidity and body mass index (BMI) of 32.0 to 32.9 in adult 05/03/2019  . Degenerative arthritis of shoulder region 05/21/2016  . Malignant neoplasm of prostate ([place]) 11/20/2014  . Travel advice encounter 04/01/2014    Scot Jun, PT, DPT, OCS, ATC 02/05/21  11:23 AM    [facility] [address] [place], [place], [address] Phone: [phone]   Fax:  [phone]  Name: William Mcfarland MRN: [id number] Date of Birth: 01-Jun-1956

## 2021-02-05 NOTE — Patient Instructions (Signed)
Access Code: [facility] URL: [email or web address] Date: 02/05/2021 Prepared by: Scot Jun  Exercises Supine Heel Slide with Strap - 3 x daily - 7 x weekly - 1 sets - 10 reps - 10 hold Supine Knee Extension Mobilization with Weight - 1 x daily - 7 x weekly - 1 sets - 4 reps - to tolerance up to 15 mins hold Small Range Straight Leg Raise - 2 x daily - 7 x weekly - 2-3 sets - 10 reps Seated Long Arc Quad - 2 x daily - 7 x weekly - 10 reps - 3 sets - 2 hold

## 2021-02-09 ENCOUNTER — Ambulatory Visit: Payer: BC Managed Care – PPO | Admitting: Physical Therapy

## 2021-02-09 ENCOUNTER — Other Ambulatory Visit: Payer: Self-pay

## 2021-02-09 ENCOUNTER — Other Ambulatory Visit: Payer: Self-pay | Admitting: Surgical

## 2021-02-09 DIAGNOSIS — R262 Difficulty in walking, not elsewhere classified: Secondary | ICD-10-CM

## 2021-02-09 DIAGNOSIS — M25562 Pain in left knee: Secondary | ICD-10-CM

## 2021-02-09 DIAGNOSIS — M6281 Muscle weakness (generalized): Secondary | ICD-10-CM | POA: Diagnosis not present

## 2021-02-09 DIAGNOSIS — R600 Localized edema: Secondary | ICD-10-CM

## 2021-02-09 NOTE — Therapy (Signed)
[facility] Physical Therapy [address] [place], [place], [address] Phone: [phone]   Fax:  [phone]  Physical Therapy Treatment  Patient Details  Name: William Mcfarland MRN: [id number] Date of Birth: 10/02/1956 Referring Provider (PT): Dr. Marlou Sa   Encounter Date: 02/09/2021   PT End of Session - 02/09/21 1444    Visit Number 2    Number of Visits 16    Date for PT Re-Evaluation 04/16/21    Progress Note Due on Visit 10    PT Start Time 3016    PT Stop Time 1450    PT Time Calculation (min) 45 min    Activity Tolerance Patient tolerated treatment well    Behavior During Therapy [facility] for tasks assessed/performed           Past Medical History:  Diagnosis Date  . Arthritis    shoulders, knees, hips  . At risk for sleep apnea    STOP-BANG= 5     SENT TO PCP 04-12-2015  . History of colon polyps   . History of radiation therapy 45 Gy plus seed implants on 04-13-2015   prostate external beam 02-20-2015 to 03-24-2015  . Hyperlipidemia   . Hypertension   . Lower urinary tract symptoms (LUTS)   . Prostate cancer [facility]) urologist-  dr ottelin/  oncologist- dr Tammi Klippel   T1c, Gleason 4+3,  PSA 6.83,  vol. 32.56cc--  s/p  external beam radiation 02-20-2015 to 03-24-2015  . Type 2 diabetes mellitus ([place])   . Type 2 diabetes, diet controlled ([place])   . Wears glasses     Past Surgical History:  Procedure Laterality Date  . COLONOSCOPY W/ POLYPECTOMY  2013  . EYE SURGERY     Cornea replacement left eye (2018)  . JOINT REPLACEMENT    . ORIF LEFT LEG FX  1988  . ORIF RIGHT ANKLE FX  1985  . PROSTATE BIOPSY    . RADIOACTIVE SEED IMPLANT N/A 04/14/2015   Procedure: RADIOACTIVE SEED IMPLANT;  Surgeon: Kathie Rhodes, MD;  Location: [facility];  Service: Urology;  Laterality: N/A;   64     seeds implanted no seeds found in bladder  . TOTAL KNEE ARTHROPLASTY Left 01/16/2021   Procedure: LEFT TOTAL KNEE ARTHROPLASTY;  Surgeon: Meredith Pel, MD;  Location: [place];  Service: Orthopedics;  Laterality: Left;  . TOTAL SHOULDER ARTHROPLASTY Right 05/21/2016   Procedure: TOTAL SHOULDER ARTHROPLASTY;  Surgeon: Meredith Pel, MD;  Location: [place];  Service: Orthopedics;  Laterality: Right;  . TOTAL SHOULDER ARTHROPLASTY Left 10/05/2019   Procedure: LEFT Total shoulder arthroplasty;  Surgeon: Meredith Pel, MD;  Location: [place];  Service: Orthopedics;  Laterality: Left;    There were no vitals filed for this visit.   Subjective Assessment - 02/09/21 1424    Subjective Pt relays 3-4/10 overall today in his left knee.    Pertinent History OA, HTN, prostate cancer, DM, HIV+    Limitations Standing;Walking;House hold activities    Patient Stated Goals Reduce pain, walk independenty, return to work    Pain Onset 1 to 4 weeks ago             [facility] Adult PT Treatment/Exercise - 02/09/21 0001      Exercises   Exercises Knee/Hip      Knee/Hip Exercises: Stretches   Active Hamstring Stretch Left;3 reps;30 seconds    Active Hamstring Stretch Limitations supine with strap    Quad Stretch Left    Quad  Stretch Limitations supine with strap attempted to hold 10 seconds at a time and be gentle but he was unable due to pain and deferred this.    Knee: Self-Stretch Limitations seated tailgate flexion stretch 10 sec X 10      Knee/Hip Exercises: Aerobic   Nustep L5-4 X 10 min      Knee/Hip Exercises: Seated   Long Arc Quad Left;2 sets;10 reps    Long Arc Quad Weight 3 lbs.    Hamstring Curl Left;2 sets;10 reps    Hamstring Limitations red    Sit to Sand 10 reps;without UE support   slightly raised mat     Vasopneumatic   Number Minutes Vasopneumatic  10 minutes    Vasopnuematic Location  Knee    Vasopneumatic Pressure Medium    Vasopneumatic Temperature  38      Manual Therapy   Manual therapy comments Lt knee PROM for flexion and extension, gentle flexion mobs and patella mobs                    PT  Short Term Goals - 02/05/21 1003      PT SHORT TERM GOAL #1   Title Patient will demonstrate independent use of home exercise program to maintain progress from in clinic treatments.    Time 3    Period Weeks    Status New    Target Date 02/26/21             PT Long Term Goals - 02/05/21 1004      PT LONG TERM GOAL #1   Title Patient will demonstrate/report pain at worst less than or equal to 2/10 to facilitate minimal limitation in daily activity secondary to pain symptoms.    Time 10    Period Weeks    Status New    Target Date 04/16/21      PT LONG TERM GOAL #2   Title Patient will demonstrate independent use of home exercise program to facilitate ability to maintain/progress functional gains from skilled physical therapy services.    Time 10    Period Weeks    Status New    Target Date 04/16/21      PT LONG TERM GOAL #3   Title Patient will demonstrate independent ambulation community distances > 300 ft to facilitate community integration at [facility].    Time 10    Period Weeks    Status New    Target Date 04/16/21      PT LONG TERM GOAL #4   Title Patient will demonstrate Lt knee AROM 0-110 degrees to facilitate ability to perform transfers, sitting, ambulation, stair navigation s restriction due to mobility.    Time 10    Period Weeks    Status New    Target Date 04/16/21      PT LONG TERM GOAL #5   Title Patient will demonstrate Lt LE MMT 5/5 throughout to facilitate ability to perform usual standing, walking, stairs at PLOF s limitation due to symptoms.    Time 10    Period Weeks    Status New    Target Date 04/16/21      Additional Long Term Goals   Additional Long Term Goals Yes      PT LONG TERM GOAL #6   Title Pt. will demonstrate FOTO score > 59 to indicated reduced disability.    Time 10    Period Weeks    Status New    Target Date  04/16/21                 Plan - 02/09/21 1446    Clinical Impression Statement Session focused on Lt knee  ROM and beginning strengthening to his overall tolerance. Good tolerance to session except for quad stretch so this was discontinued by his request. PT will attempt to progress ROM and strength as able.    Personal Factors and Comorbidities Comorbidity 3+    Comorbidities OA, HTN, prostate cancer, DM, HIV+    Examination-Activity Limitations Sit;Sleep;Squat;Bend;Stairs;Stand;Transfers;Locomotion Level    Stability/Clinical Decision Making Stable/Uncomplicated    Rehab Potential Good    PT Frequency 2x / week   2x/week then transitioning to 1-2x/week as appropraite   PT Duration --   10 weeks   PT Treatment/Interventions ADLs/Self Care Home Management;Cryotherapy;Electrical Stimulation;Moist Heat;Balance training;Therapeutic exercise;Iontophoresis 4mg /ml Dexamethasone;Therapeutic activities;Functional mobility training;Stair training;Gait training;DME Instruction;Neuromuscular re-education;Patient/family education;Manual techniques;Vasopneumatic Device;Taping;Dry needling;Passive range of motion;Spinal Manipulations;Joint Manipulations    PT Next Visit Plan Reassess HEP, manual for mobility gains, strengthening OKC, CKC.    PT Home Exercise Plan [facility]    Consulted and Agree with Plan of Care Patient           Patient will benefit from skilled therapeutic intervention in order to improve the following deficits and impairments:  Pain,Decreased coordination,Impaired flexibility,Impaired perceived functional ability,Improper body mechanics,Difficulty walking,Decreased mobility,Decreased balance,Decreased range of motion,Decreased strength,Decreased activity tolerance,Increased edema,Hypomobility,Decreased endurance,Abnormal gait  Visit Diagnosis: Left knee pain, unspecified chronicity  Muscle weakness (generalized)  Difficulty in walking, not elsewhere classified  Localized edema     Problem List Patient Active Problem List   Diagnosis Date Noted  . Arthritis of left knee   . S/P  total knee arthroplasty, left 01/16/2021  . Gout 07/03/2020  . 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 Healthcare maintenance 06/03/2019  . Human immunodeficiency virus (HIV) disease ([place]) 05/03/2019  . Type 2 diabetes mellitus with hyperglycemia, without long-term current use of insulin ([place]) 05/03/2019  . Essential hypertension 05/03/2019  . Class 1 obesity due to excess calories with serious comorbidity and body mass index (BMI) of 32.0 to 32.9 in adult 05/03/2019  . Degenerative arthritis of shoulder region 05/21/2016  . Malignant neoplasm of prostate ([place]) 11/20/2014  . Travel advice encounter 04/01/2014    Silvestre Mesi 02/09/2021, 2:48 PM  [facility] Physical Therapy [address] [place], [place], [address] Phone: [phone]   Fax:  [phone]  Name: William Mcfarland MRN: [id number] Date of Birth: [birth year]

## 2021-02-12 ENCOUNTER — Ambulatory Visit (INDEPENDENT_AMBULATORY_CARE_PROVIDER_SITE_OTHER): Payer: BC Managed Care – PPO | Admitting: Physical Therapy

## 2021-02-12 ENCOUNTER — Other Ambulatory Visit: Payer: Self-pay

## 2021-02-12 DIAGNOSIS — R262 Difficulty in walking, not elsewhere classified: Secondary | ICD-10-CM | POA: Diagnosis not present

## 2021-02-12 DIAGNOSIS — M6281 Muscle weakness (generalized): Secondary | ICD-10-CM | POA: Diagnosis not present

## 2021-02-12 DIAGNOSIS — R600 Localized edema: Secondary | ICD-10-CM

## 2021-02-12 DIAGNOSIS — M25562 Pain in left knee: Secondary | ICD-10-CM

## 2021-02-12 NOTE — Therapy (Signed)
[facility] Physical Therapy [address] [place], [place], [address] Phone: [phone]   Fax:  [phone]  Physical Therapy Treatment  Patient Details  Name: William Mcfarland MRN: [id number] Date of Birth: 1956-07-11 Referring Provider (PT): Dr. Marlou Sa   Encounter Date: 02/12/2021   PT End of Session - 02/12/21 1350    Visit Number 3    Number of Visits 16    Date for PT Re-Evaluation 04/16/21    Progress Note Due on Visit 10    PT Start Time 1300    PT Stop Time 1339    PT Time Calculation (min) 39 min    Activity Tolerance Patient tolerated treatment well    Behavior During Therapy [facility] for tasks assessed/performed           Past Medical History:  Diagnosis Date  . Arthritis    shoulders, knees, hips  . At risk for sleep apnea    STOP-BANG= 5     SENT TO PCP 04-12-2015  . History of colon polyps   . History of radiation therapy 45 Gy plus seed implants on 04-13-2015   prostate external beam 02-20-2015 to 03-24-2015  . Hyperlipidemia   . Hypertension   . Lower urinary tract symptoms (LUTS)   . Prostate cancer [facility]) urologist-  dr ottelin/  oncologist- dr Tammi Klippel   T1c, Gleason 4+3,  PSA 6.83,  vol. 32.56cc--  s/p  external beam radiation 02-20-2015 to 03-24-2015  . Type 2 diabetes mellitus ([place])   . Type 2 diabetes, diet controlled ([place])   . Wears glasses     Past Surgical History:  Procedure Laterality Date  . COLONOSCOPY W/ POLYPECTOMY  2013  . EYE SURGERY     Cornea replacement left eye (2018)  . JOINT REPLACEMENT    . ORIF LEFT LEG FX  1988  . ORIF RIGHT ANKLE FX  1985  . PROSTATE BIOPSY    . RADIOACTIVE SEED IMPLANT N/A 04/14/2015   Procedure: RADIOACTIVE SEED IMPLANT;  Surgeon: Kathie Rhodes, MD;  Location: [facility];  Service: Urology;  Laterality: N/A;   64     seeds implanted no seeds found in bladder  . TOTAL KNEE ARTHROPLASTY Left 01/16/2021   Procedure: LEFT TOTAL KNEE ARTHROPLASTY;  Surgeon: Meredith Pel, MD;  Location: [place];  Service: Orthopedics;  Laterality: Left;  . TOTAL SHOULDER ARTHROPLASTY Right 05/21/2016   Procedure: TOTAL SHOULDER ARTHROPLASTY;  Surgeon: Meredith Pel, MD;  Location: [place];  Service: Orthopedics;  Laterality: Right;  . TOTAL SHOULDER ARTHROPLASTY Left 10/05/2019   Procedure: LEFT Total shoulder arthroplasty;  Surgeon: Meredith Pel, MD;  Location: [place];  Service: Orthopedics;  Laterality: Left;    There were no vitals filed for this visit.   Subjective Assessment - 02/12/21 1331    Subjective Pt relays overall more pain 6/10 in his knee but has been going up/down a lot of steps. Does relay some lack of interest overall    Pertinent History OA, HTN, prostate cancer, DM, HIV+    Limitations Standing;Walking;House hold activities    Patient Stated Goals Reduce pain, walk independenty, return to work    Pain Onset 1 to 4 weeks ago              [facility] PT Assessment - 02/12/21 0001      Assessment   Medical Diagnosis Lt TKA    Referring Provider (PT) Dr. Marlou Sa    Onset Date/Surgical Date 01/16/21  PROM   Left Knee Flexion 96            [facility] Adult PT Treatment/Exercise - 02/12/21 0001      Knee/Hip Exercises: Stretches   Active Hamstring Stretch Left;30 seconds;2 reps    Active Hamstring Stretch Limitations seated    Knee: Self-Stretch Limitations seated tailgate flexion stretch 10 sec X 10      Knee/Hip Exercises: Aerobic   Recumbent Bike UBE leg portion 6 min rocking ROM      Knee/Hip Exercises: Machines for Strengthening   Total Gym Leg Press Lt leg 25 lbs 3X10      Knee/Hip Exercises: Standing   Functional Squat Limitations with UE support at sink 2 sets of 10      Knee/Hip Exercises: Seated   Long Arc Quad Left;2 sets;10 reps    Long Arc Quad Weight 3 lbs.    Other Seated Knee/Hip Exercises seated SLR 2 sets of 10    Hamstring Curl Left;2 sets;15 reps    Hamstring Limitations green    Sit to Sand 10 reps;without UE  support      Manual Therapy   Manual therapy comments Lt knee PROM for flexion and extension, gentle flexion mobs and patella mobs, hip IR/ER stretching                    PT Short Term Goals - 02/05/21 1003      PT SHORT TERM GOAL #1   Title Patient will demonstrate independent use of home exercise program to maintain progress from in clinic treatments.    Time 3    Period Weeks    Status New    Target Date 02/26/21             PT Long Term Goals - 02/05/21 1004      PT LONG TERM GOAL #1   Title Patient will demonstrate/report pain at worst less than or equal to 2/10 to facilitate minimal limitation in daily activity secondary to pain symptoms.    Time 10    Period Weeks    Status New    Target Date 04/16/21      PT LONG TERM GOAL #2   Title Patient will demonstrate independent use of home exercise program to facilitate ability to maintain/progress functional gains from skilled physical therapy services.    Time 10    Period Weeks    Status New    Target Date 04/16/21      PT LONG TERM GOAL #3   Title Patient will demonstrate independent ambulation community distances > 300 ft to facilitate community integration at [facility].    Time 10    Period Weeks    Status New    Target Date 04/16/21      PT LONG TERM GOAL #4   Title Patient will demonstrate Lt knee AROM 0-110 degrees to facilitate ability to perform transfers, sitting, ambulation, stair navigation s restriction due to mobility.    Time 10    Period Weeks    Status New    Target Date 04/16/21      PT LONG TERM GOAL #5   Title Patient will demonstrate Lt LE MMT 5/5 throughout to facilitate ability to perform usual standing, walking, stairs at PLOF s limitation due to symptoms.    Time 10    Period Weeks    Status New    Target Date 04/16/21      Additional Long Term Goals   Additional  Long Term Goals Yes      PT LONG TERM GOAL #6   Title Pt. will demonstrate FOTO score > 59 to indicated reduced  disability.    Time 10    Period Weeks    Status New    Target Date 04/16/21                 Plan - 02/12/21 1350    Clinical Impression Statement Continued to focus on knee ROM and gentle strength to his tolerance. He showed improved knee flexion PROM today with updated measurment. Continue POC.    Personal Factors and Comorbidities Comorbidity 3+    Comorbidities OA, HTN, prostate cancer, DM, HIV+    Examination-Activity Limitations Sit;Sleep;Squat;Bend;Stairs;Stand;Transfers;Locomotion Level    Stability/Clinical Decision Making Stable/Uncomplicated    Rehab Potential Good    PT Frequency 2x / week   2x/week then transitioning to 1-2x/week as appropraite   PT Duration --   10 weeks   PT Treatment/Interventions ADLs/Self Care Home Management;Cryotherapy;Electrical Stimulation;Moist Heat;Balance training;Therapeutic exercise;Iontophoresis 4mg /ml Dexamethasone;Therapeutic activities;Functional mobility training;Stair training;Gait training;DME Instruction;Neuromuscular re-education;Patient/family education;Manual techniques;Vasopneumatic Device;Taping;Dry needling;Passive range of motion;Spinal Manipulations;Joint Manipulations    PT Next Visit Plan manual for mobility gains, strengthening OKC, CKC.    PT Home Exercise Plan [facility]    Consulted and Agree with Plan of Care Patient           Patient will benefit from skilled therapeutic intervention in order to improve the following deficits and impairments:  Pain,Decreased coordination,Impaired flexibility,Impaired perceived functional ability,Improper body mechanics,Difficulty walking,Decreased mobility,Decreased balance,Decreased range of motion,Decreased strength,Decreased activity tolerance,Increased edema,Hypomobility,Decreased endurance,Abnormal gait  Visit Diagnosis: Left knee pain, unspecified chronicity  Muscle weakness (generalized)  Difficulty in walking, not elsewhere classified  Localized edema     Problem  List Patient Active Problem List   Diagnosis Date Noted  . Arthritis of left knee   . S/P total knee arthroplasty, left 01/16/2021  . Gout 07/03/2020  . 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 Healthcare maintenance 06/03/2019  . Human immunodeficiency virus (HIV) disease ([place]) 05/03/2019  . Type 2 diabetes mellitus with hyperglycemia, without long-term current use of insulin ([place]) 05/03/2019  . Essential hypertension 05/03/2019  . Class 1 obesity due to excess calories with serious comorbidity and body mass index (BMI) of 32.0 to 32.9 in adult 05/03/2019  . Degenerative arthritis of shoulder region 05/21/2016  . Malignant neoplasm of prostate ([place]) 11/20/2014  . Travel advice encounter 04/01/2014    Silvestre Mesi 02/12/2021, 1:52 PM  [facility] Physical Therapy [address] [place], [place], [address] Phone: [phone]   Fax:  [phone]  Name: OMAREE FUQUA MRN: [id number] Date of Birth: [birth year]

## 2021-02-14 ENCOUNTER — Other Ambulatory Visit: Payer: Self-pay

## 2021-02-14 ENCOUNTER — Encounter: Payer: Self-pay | Admitting: Physical Therapy

## 2021-02-14 ENCOUNTER — Ambulatory Visit (INDEPENDENT_AMBULATORY_CARE_PROVIDER_SITE_OTHER): Payer: BC Managed Care – PPO | Admitting: Physical Therapy

## 2021-02-14 DIAGNOSIS — M25562 Pain in left knee: Secondary | ICD-10-CM | POA: Diagnosis not present

## 2021-02-14 DIAGNOSIS — R600 Localized edema: Secondary | ICD-10-CM

## 2021-02-14 DIAGNOSIS — M6281 Muscle weakness (generalized): Secondary | ICD-10-CM | POA: Diagnosis not present

## 2021-02-14 DIAGNOSIS — R262 Difficulty in walking, not elsewhere classified: Secondary | ICD-10-CM | POA: Diagnosis not present

## 2021-02-14 DIAGNOSIS — M25612 Stiffness of left shoulder, not elsewhere classified: Secondary | ICD-10-CM

## 2021-02-14 DIAGNOSIS — M25512 Pain in left shoulder: Secondary | ICD-10-CM

## 2021-02-14 DIAGNOSIS — R293 Abnormal posture: Secondary | ICD-10-CM

## 2021-02-14 NOTE — Therapy (Signed)
[facility] Physical Therapy [address] [place], [place], [address] Phone: [phone]   Fax:  [phone]  Physical Therapy Treatment  Patient Details  Name: William Mcfarland MRN: [id number] Date of Birth: 1956-05-28 Referring Provider (PT): Dr. Marlou Sa   Encounter Date: 02/14/2021   PT End of Session - 02/14/21 1310    Visit Number 4    Number of Visits 16    Date for PT Re-Evaluation 04/16/21    Progress Note Due on Visit 10    PT Start Time 1300    PT Stop Time 1345    PT Time Calculation (min) 45 min    Activity Tolerance Patient tolerated treatment well    Behavior During Therapy [facility] for tasks assessed/performed           Past Medical History:  Diagnosis Date   Arthritis    shoulders, knees, hips   At risk for sleep apnea    STOP-BANG= 5     SENT TO PCP 04-12-2015   History of colon polyps    History of radiation therapy 45 Gy plus seed implants on 04-13-2015   prostate external beam 02-20-2015 to 03-24-2015   Hyperlipidemia    Hypertension    Lower urinary tract symptoms (LUTS)    Prostate cancer [facility]) urologist-  dr ottelin/  oncologist- dr Tammi Klippel   T1c, Gleason 4+3,  PSA 6.83,  vol. 32.56cc--  s/p  external beam radiation 02-20-2015 to 03-24-2015   Type 2 diabetes mellitus ([place])    Type 2 diabetes, diet controlled ([place])    Wears glasses     Past Surgical History:  Procedure Laterality Date   COLONOSCOPY W/ POLYPECTOMY  2013   EYE SURGERY     Cornea replacement left eye (2018)   JOINT REPLACEMENT     ORIF LEFT LEG FX  1988   ORIF RIGHT ANKLE FX  1985   PROSTATE BIOPSY     RADIOACTIVE SEED IMPLANT N/A 04/14/2015   Procedure: RADIOACTIVE SEED IMPLANT;  Surgeon: Kathie Rhodes, MD;  Location: [place];  Service: Urology;  Laterality: N/A;   64     seeds implanted no seeds found in bladder   TOTAL KNEE ARTHROPLASTY Left 01/16/2021   Procedure: LEFT TOTAL KNEE ARTHROPLASTY;  Surgeon: Meredith Pel, MD;  Location: [place];  Service: Orthopedics;  Laterality: Left;   TOTAL SHOULDER ARTHROPLASTY Right 05/21/2016   Procedure: TOTAL SHOULDER ARTHROPLASTY;  Surgeon: Meredith Pel, MD;  Location: [place];  Service: Orthopedics;  Laterality: Right;   TOTAL SHOULDER ARTHROPLASTY Left 10/05/2019   Procedure: LEFT Total shoulder arthroplasty;  Surgeon: Meredith Pel, MD;  Location: [place];  Service: Orthopedics;  Laterality: Left;    There were no vitals filed for this visit.   Subjective Assessment - 02/14/21 1308    Subjective Patient states his pain is 5/10 but is feeling better after hip PROM last visit. Still notes having to lift his leg up to get out of the car.    Pertinent History OA, HTN, prostate cancer, DM, HIV+    Limitations Standing;Walking;House hold activities    Patient Stated Goals Reduce pain, walk independenty, return to work    Currently in Pain? Yes    Pain Score 5     Pain Location Knee    Pain Orientation Left    Pain Descriptors / Indicators Aching    Pain Type Surgical pain    Pain Onset 1 to 4 weeks ago    Pain  Frequency Constant    Pain Relieving Factors pain meds, rest    Effect of Pain on Daily Activities walking, motorcycle riding, getting out of car                 [facility] Adult PT Treatment/Exercise - 02/14/21 0001      Knee/Hip Exercises: Stretches   Active Hamstring Stretch Left;30 seconds;2 reps    Active Hamstring Stretch Limitations supine with strap    Knee: Self-Stretch Limitations seated tailgate flexion stretch 10 sec X 10      Knee/Hip Exercises: Aerobic   Recumbent Bike Rocking ROM forward/reverse 6 mins      Knee/Hip Exercises: Machines for Strengthening   Total Gym Leg Press LLE 37 lbs 2x10, 43 lbs 1x10      Knee/Hip Exercises: Seated   Long Arc Quad Left;2 sets;10 reps    Long Arc Quad Weight 3 lbs.    Other Seated Knee/Hip Exercises ball squeeze between knees with red band around ankles 10x 5 sec hold, ball squeeze  between ankles red band around knees 10x 5 sec hold    Hamstring Curl 2 sets;10 reps;Left    Hamstring Limitations red    Sit to Sand 10 reps;without UE support      Knee/Hip Exercises: Supine   Heel Slides AAROM;Left;1 set;10 reps   103 ending knee flexion   Straight Leg Raises 5 reps;Left      Manual Therapy   Manual therapy comments Lt knee PROM for flexion and extension, gentle flexion mobs and patella mobs, hip IR/ER stretching                    PT Short Term Goals - 02/05/21 1003      PT SHORT TERM GOAL #1   Title Patient will demonstrate independent use of home exercise program to maintain progress from in clinic treatments.    Time 3    Period Weeks    Status New    Target Date 02/26/21             PT Long Term Goals - 02/05/21 1004      PT LONG TERM GOAL #1   Title Patient will demonstrate/report pain at worst less than or equal to 2/10 to facilitate minimal limitation in daily activity secondary to pain symptoms.    Time 10    Period Weeks    Status New    Target Date 04/16/21      PT LONG TERM GOAL #2   Title Patient will demonstrate independent use of home exercise program to facilitate ability to maintain/progress functional gains from skilled physical therapy services.    Time 10    Period Weeks    Status New    Target Date 04/16/21      PT LONG TERM GOAL #3   Title Patient will demonstrate independent ambulation community distances > 300 ft to facilitate community integration at [facility].    Time 10    Period Weeks    Status New    Target Date 04/16/21      PT LONG TERM GOAL #4   Title Patient will demonstrate Lt knee AROM 0-110 degrees to facilitate ability to perform transfers, sitting, ambulation, stair navigation s restriction due to mobility.    Time 10    Period Weeks    Status New    Target Date 04/16/21      PT LONG TERM GOAL #5   Title Patient will demonstrate Lt LE  MMT 5/5 throughout to facilitate ability to perform usual  standing, walking, stairs at PLOF s limitation due to symptoms.    Time 10    Period Weeks    Status New    Target Date 04/16/21      Additional Long Term Goals   Additional Long Term Goals Yes      PT LONG TERM GOAL #6   Title Pt. will demonstrate FOTO score > 59 to indicated reduced disability.    Time 10    Period Weeks    Status New    Target Date 04/16/21                 Plan - 02/14/21 1346    Clinical Impression Statement Patient exercise tolerance is increasing. Patient reports feeling more limber with PROM of hip and knee. AROM and PROM knee flexion also improved slightly.    Personal Factors and Comorbidities Comorbidity 3+    Comorbidities OA, HTN, prostate cancer, DM, HIV+    Examination-Activity Limitations Sit;Sleep;Squat;Bend;Stairs;Stand;Transfers;Locomotion Level    Stability/Clinical Decision Making Stable/Uncomplicated    Rehab Potential Good    PT Frequency 2x / week   2x/week then transitioning to 1-2x/week as appropraite   PT Duration --   10 weeks   PT Treatment/Interventions ADLs/Self Care Home Management;Cryotherapy;Electrical Stimulation;Moist Heat;Balance training;Therapeutic exercise;Iontophoresis 4mg /ml Dexamethasone;Therapeutic activities;Functional mobility training;Stair training;Gait training;DME Instruction;Neuromuscular re-education;Patient/family education;Manual techniques;Vasopneumatic Device;Taping;Dry needling;Passive range of motion;Spinal Manipulations;Joint Manipulations    PT Next Visit Plan manual for mobility gains, strengthening and AROM/PROM    PT Home Exercise Plan [facility]    Consulted and Agree with Plan of Care Patient           Patient will benefit from skilled therapeutic intervention in order to improve the following deficits and impairments:  Pain,Decreased coordination,Impaired flexibility,Impaired perceived functional ability,Improper body mechanics,Difficulty walking,Decreased mobility,Decreased balance,Decreased  range of motion,Decreased strength,Decreased activity tolerance,Increased edema,Hypomobility,Decreased endurance,Abnormal gait  Visit Diagnosis: Left knee pain, unspecified chronicity  Muscle weakness (generalized)  Difficulty in walking, not elsewhere classified  Localized edema  Acute pain of left shoulder  Stiffness of left shoulder, not elsewhere classified  Abnormal posture     Problem List Patient Active Problem List   Diagnosis Date Noted   Arthritis of left knee    S/P total knee arthroplasty, left 01/16/2021   Gout 07/03/2020   OA (osteoarthritis) of shoulder 10/05/2019   Healthcare maintenance 06/03/2019   Human immunodeficiency virus (HIV) disease ([place]) 05/03/2019   Type 2 diabetes mellitus with hyperglycemia, without long-term current use of insulin ([place]) 05/03/2019   Essential hypertension 05/03/2019   Class 1 obesity due to excess calories with serious comorbidity and body mass index (BMI) of 32.0 to 32.9 in adult 05/03/2019   Degenerative arthritis of shoulder region 05/21/2016   Malignant neoplasm of prostate ([place]) 11/20/2014   Travel advice encounter 04/01/2014    Glenetta Hew, SPT 02/14/2021, 1:52 PM   During this treatment session, this physical therapist was present, participating in and directing the treatment.   This note has been reviewed and this clinician agrees with the information provided.  Elsie Ra, PT, DPT 02/15/21 8:45 AM   [facility] Physical Therapy [address] [place], [place], [address] Phone: [phone]   Fax:  [phone]  Name: DOCTOR SHEAHAN MRN: [id number] Date of Birth: 1956-02-13

## 2021-02-19 ENCOUNTER — Other Ambulatory Visit: Payer: Self-pay

## 2021-02-19 ENCOUNTER — Other Ambulatory Visit: Payer: BC Managed Care – PPO

## 2021-02-19 DIAGNOSIS — Z79899 Other long term (current) drug therapy: Secondary | ICD-10-CM

## 2021-02-19 DIAGNOSIS — B20 Human immunodeficiency virus [HIV] disease: Secondary | ICD-10-CM

## 2021-02-20 ENCOUNTER — Ambulatory Visit (INDEPENDENT_AMBULATORY_CARE_PROVIDER_SITE_OTHER): Payer: BC Managed Care – PPO | Admitting: Physical Therapy

## 2021-02-20 ENCOUNTER — Other Ambulatory Visit: Payer: Self-pay | Admitting: Orthopedic Surgery

## 2021-02-20 ENCOUNTER — Other Ambulatory Visit: Payer: Self-pay

## 2021-02-20 DIAGNOSIS — R262 Difficulty in walking, not elsewhere classified: Secondary | ICD-10-CM

## 2021-02-20 DIAGNOSIS — M6281 Muscle weakness (generalized): Secondary | ICD-10-CM | POA: Diagnosis not present

## 2021-02-20 DIAGNOSIS — M25562 Pain in left knee: Secondary | ICD-10-CM | POA: Diagnosis not present

## 2021-02-20 DIAGNOSIS — R600 Localized edema: Secondary | ICD-10-CM

## 2021-02-20 LAB — T-HELPER CELL (CD4) - (RCID CLINIC ONLY)
CD4 % Helper T Cell: 54 % (ref 33–65)
CD4 T Cell Abs: 1071 /uL (ref 400–1790)

## 2021-02-20 NOTE — Telephone Encounter (Signed)
Please advise 

## 2021-02-20 NOTE — Therapy (Signed)
[facility] Physical Therapy [address] [place], [place], [address] Phone: [phone]   Fax:  [phone]  Physical Therapy Treatment  Patient Details  Name: William Mcfarland MRN: [id number] Date of Birth: 03-26-1956 Referring Provider (PT): Dr. Marlou Sa   Encounter Date: 02/20/2021   PT End of Session - 02/20/21 1012    Visit Number 5    Number of Visits 16    Date for PT Re-Evaluation 04/16/21    Progress Note Due on Visit 10    PT Start Time 0930    PT Stop Time 1020    PT Time Calculation (min) 50 min    Activity Tolerance Patient tolerated treatment well    Behavior During Therapy [facility] for tasks assessed/performed           Past Medical History:  Diagnosis Date  . Arthritis    shoulders, knees, hips  . At risk for sleep apnea    STOP-BANG= 5     SENT TO PCP 04-12-2015  . History of colon polyps   . History of radiation therapy 45 Gy plus seed implants on 04-13-2015   prostate external beam 02-20-2015 to 03-24-2015  . Hyperlipidemia   . Hypertension   . Lower urinary tract symptoms (LUTS)   . Prostate cancer [facility]) urologist-  dr ottelin/  oncologist- dr Tammi Klippel   T1c, Gleason 4+3,  PSA 6.83,  vol. 32.56cc--  s/p  external beam radiation 02-20-2015 to 03-24-2015  . Type 2 diabetes mellitus ([place])   . Type 2 diabetes, diet controlled ([place])   . Wears glasses     Past Surgical History:  Procedure Laterality Date  . COLONOSCOPY W/ POLYPECTOMY  2013  . EYE SURGERY     Cornea replacement left eye (2018)  . JOINT REPLACEMENT    . ORIF LEFT LEG FX  1988  . ORIF RIGHT ANKLE FX  1985  . PROSTATE BIOPSY    . RADIOACTIVE SEED IMPLANT N/A 04/14/2015   Procedure: RADIOACTIVE SEED IMPLANT;  Surgeon: Kathie Rhodes, MD;  Location: [facility];  Service: Urology;  Laterality: N/A;   64     seeds implanted no seeds found in bladder  . TOTAL KNEE ARTHROPLASTY Left 01/16/2021   Procedure: LEFT TOTAL KNEE ARTHROPLASTY;  Surgeon: Meredith Pel, MD;  Location: [place];  Service: Orthopedics;  Laterality: Left;  . TOTAL SHOULDER ARTHROPLASTY Right 05/21/2016   Procedure: TOTAL SHOULDER ARTHROPLASTY;  Surgeon: Meredith Pel, MD;  Location: [place];  Service: Orthopedics;  Laterality: Right;  . TOTAL SHOULDER ARTHROPLASTY Left 10/05/2019   Procedure: LEFT Total shoulder arthroplasty;  Surgeon: Meredith Pel, MD;  Location: [place];  Service: Orthopedics;  Laterality: Left;    There were no vitals filed for this visit.   Subjective Assessment - 02/20/21 1011    Subjective Patient states still with some knee stiffness and irritation in his knee.    Pertinent History OA, HTN, prostate cancer, DM, HIV+    Limitations Standing;Walking;House hold activities    Patient Stated Goals Reduce pain, walk independenty, return to work    Pain Onset 1 to 4 weeks ago            [facility] Adult PT Treatment/Exercise - 02/20/21 0001      Knee/Hip Exercises: Stretches   Active Hamstring Stretch Left;3 reps;30 seconds    Active Hamstring Stretch Limitations seated    Knee: Self-Stretch Limitations seated tailgate flexion stretch 10 sec X 10 contract-relax  Knee/Hip Exercises: Aerobic   Recumbent Bike rocking then full revolutions 10 min total      Knee/Hip Exercises: Machines for Strengthening   Cybex Knee Extension Lt leg 5 lbs 3x10    Cybex Knee Flexion bilat 35 lbs 3X10    Total Gym Leg Press DL 75 lbs 2x10, LLE 37 lbs 2X10      Knee/Hip Exercises: Seated   Sit to Sand 2 sets;10 reps;without UE support   raised mat surface     Vasopneumatic   Number Minutes Vasopneumatic  10 minutes    Vasopnuematic Location  Knee    Vasopneumatic Pressure Medium    Vasopneumatic Temperature  34      Manual Therapy   Manual therapy comments Lt knee PROM for flexion and extension, gentle flexion mobs and patella mobs, hip IR/ER stretching                    PT Short Term Goals - 02/05/21 1003      PT SHORT TERM GOAL #1    Title Patient will demonstrate independent use of home exercise program to maintain progress from in clinic treatments.    Time 3    Period Weeks    Status New    Target Date 02/26/21             PT Long Term Goals - 02/05/21 1004      PT LONG TERM GOAL #1   Title Patient will demonstrate/report pain at worst less than or equal to 2/10 to facilitate minimal limitation in daily activity secondary to pain symptoms.    Time 10    Period Weeks    Status New    Target Date 04/16/21      PT LONG TERM GOAL #2   Title Patient will demonstrate independent use of home exercise program to facilitate ability to maintain/progress functional gains from skilled physical therapy services.    Time 10    Period Weeks    Status New    Target Date 04/16/21      PT LONG TERM GOAL #3   Title Patient will demonstrate independent ambulation community distances > 300 ft to facilitate community integration at [facility].    Time 10    Period Weeks    Status New    Target Date 04/16/21      PT LONG TERM GOAL #4   Title Patient will demonstrate Lt knee AROM 0-110 degrees to facilitate ability to perform transfers, sitting, ambulation, stair navigation s restriction due to mobility.    Time 10    Period Weeks    Status New    Target Date 04/16/21      PT LONG TERM GOAL #5   Title Patient will demonstrate Lt LE MMT 5/5 throughout to facilitate ability to perform usual standing, walking, stairs at PLOF s limitation due to symptoms.    Time 10    Period Weeks    Status New    Target Date 04/16/21      Additional Long Term Goals   Additional Long Term Goals Yes      PT LONG TERM GOAL #6   Title Pt. will demonstrate FOTO score > 59 to indicated reduced disability.    Time 10    Period Weeks    Status New    Target Date 04/16/21                 Plan - 02/20/21 1012  Clinical Impression Statement He is progressing well with strength, ROM more slowly progressing but continues to improve  each week. PT will continue to progress as able.    Personal Factors and Comorbidities Comorbidity 3+    Comorbidities OA, HTN, prostate cancer, DM, HIV+    Examination-Activity Limitations Sit;Sleep;Squat;Bend;Stairs;Stand;Transfers;Locomotion Level    Stability/Clinical Decision Making Stable/Uncomplicated    Rehab Potential Good    PT Frequency 2x / week   2x/week then transitioning to 1-2x/week as appropraite   PT Duration --   10 weeks   PT Treatment/Interventions ADLs/Self Care Home Management;Cryotherapy;Electrical Stimulation;Moist Heat;Balance training;Therapeutic exercise;Iontophoresis 4mg /ml Dexamethasone;Therapeutic activities;Functional mobility training;Stair training;Gait training;DME Instruction;Neuromuscular re-education;Patient/family education;Manual techniques;Vasopneumatic Device;Taping;Dry needling;Passive range of motion;Spinal Manipulations;Joint Manipulations    PT Next Visit Plan manual for mobility gains, strengthening and AROM/PROM    PT Home Exercise Plan [facility]    Consulted and Agree with Plan of Care Patient           Patient will benefit from skilled therapeutic intervention in order to improve the following deficits and impairments:  Pain,Decreased coordination,Impaired flexibility,Impaired perceived functional ability,Improper body mechanics,Difficulty walking,Decreased mobility,Decreased balance,Decreased range of motion,Decreased strength,Decreased activity tolerance,Increased edema,Hypomobility,Decreased endurance,Abnormal gait  Visit Diagnosis: Left knee pain, unspecified chronicity  Muscle weakness (generalized)  Difficulty in walking, not elsewhere classified  Localized edema     Problem List Patient Active Problem List   Diagnosis Date Noted  . Arthritis of left knee   . S/P total knee arthroplasty, left 01/16/2021  . Gout 07/03/2020  . 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 Healthcare maintenance 06/03/2019  . Human  immunodeficiency virus (HIV) disease ([place]) 05/03/2019  . Type 2 diabetes mellitus with hyperglycemia, without long-term current use of insulin ([place]) 05/03/2019  . Essential hypertension 05/03/2019  . Class 1 obesity due to excess calories with serious comorbidity and body mass index (BMI) of 32.0 to 32.9 in adult 05/03/2019  . Degenerative arthritis of shoulder region 05/21/2016  . Malignant neoplasm of prostate ([place]) 11/20/2014  . Travel advice encounter 04/01/2014    Debbe Odea, PT,DPT 02/20/2021, 10:13 AM  [facility] Physical Therapy [address] [place], [place], [address] Phone: [phone]   Fax:  [phone]  Name: KOHAN AZIZI MRN: [id number] Date of Birth: 1956-10-12

## 2021-02-20 NOTE — Telephone Encounter (Signed)
Okay to stop this, looks like we were tapering down

## 2021-02-21 LAB — COMPREHENSIVE METABOLIC PANEL
AG Ratio: 1.5 (calc) (ref 1.0–2.5)
ALT: 17 U/L (ref 9–46)
AST: 13 U/L (ref 10–35)
Albumin: 4.1 g/dL (ref 3.6–5.1)
Alkaline phosphatase (APISO): 90 U/L (ref 35–144)
BUN: 16 mg/dL (ref 7–25)
CO2: 28 mmol/L (ref 20–32)
Calcium: 9.7 mg/dL (ref 8.6–10.3)
Chloride: 100 mmol/L (ref 98–110)
Creat: 0.79 mg/dL (ref 0.70–1.25)
Globulin: 2.8 g/dL (calc) (ref 1.9–3.7)
Glucose, Bld: 144 mg/dL — ABNORMAL HIGH (ref 65–99)
Potassium: 4 mmol/L (ref 3.5–5.3)
Sodium: 139 mmol/L (ref 135–146)
Total Bilirubin: 0.3 mg/dL (ref 0.2–1.2)
Total Protein: 6.9 g/dL (ref 6.1–8.1)

## 2021-02-21 LAB — HIV-1 RNA QUANT-NO REFLEX-BLD
HIV 1 RNA Quant: NOT DETECTED Copies/mL
HIV-1 RNA Quant, Log: NOT DETECTED Log cps/mL

## 2021-02-21 LAB — LIPID PANEL
Cholesterol: 152 mg/dL (ref <200)
HDL: 48 mg/dL (ref >=40)
LDL Cholesterol (Calc): 77 mg/dL (calc)
Non-HDL Cholesterol (Calc): 104 mg/dL (calc) (ref <130)
Total CHOL/HDL Ratio: 3.2 (calc) (ref <5.0)
Triglycerides: 167 mg/dL — ABNORMAL HIGH (ref <150)

## 2021-02-22 ENCOUNTER — Encounter: Payer: Self-pay | Admitting: Physical Therapy

## 2021-02-22 ENCOUNTER — Ambulatory Visit (INDEPENDENT_AMBULATORY_CARE_PROVIDER_SITE_OTHER): Payer: BC Managed Care – PPO | Admitting: Physical Therapy

## 2021-02-22 ENCOUNTER — Other Ambulatory Visit: Payer: Self-pay

## 2021-02-22 DIAGNOSIS — R262 Difficulty in walking, not elsewhere classified: Secondary | ICD-10-CM | POA: Diagnosis not present

## 2021-02-22 DIAGNOSIS — M6281 Muscle weakness (generalized): Secondary | ICD-10-CM | POA: Diagnosis not present

## 2021-02-22 DIAGNOSIS — M25562 Pain in left knee: Secondary | ICD-10-CM | POA: Diagnosis not present

## 2021-02-22 DIAGNOSIS — R600 Localized edema: Secondary | ICD-10-CM | POA: Diagnosis not present

## 2021-02-22 NOTE — Therapy (Signed)
[facility] Physical Therapy [address] [place], [place], [address] Phone: [phone]   Fax:  [phone]  Physical Therapy Treatment  Patient Details  Name: William Mcfarland MRN: [id number] Date of Birth: May 02, 1956 Referring Provider (PT): Dr. Marlou Sa   Encounter Date: 02/22/2021   PT End of Session - 02/22/21 1056    Visit Number 6    Number of Visits 16    Date for PT Re-Evaluation 04/16/21    Progress Note Due on Visit 10    PT Start Time 1012    PT Stop Time 1102    PT Time Calculation (min) 50 min    Activity Tolerance Patient tolerated treatment well    Behavior During Therapy [facility] for tasks assessed/performed           Past Medical History:  Diagnosis Date  . Arthritis    shoulders, knees, hips  . At risk for sleep apnea    STOP-BANG= 5     SENT TO PCP 04-12-2015  . History of colon polyps   . History of radiation therapy 45 Gy plus seed implants on 04-13-2015   prostate external beam 02-20-2015 to 03-24-2015  . Hyperlipidemia   . Hypertension   . Lower urinary tract symptoms (LUTS)   . Prostate cancer [facility]) urologist-  dr ottelin/  oncologist- dr Tammi Klippel   T1c, Gleason 4+3,  PSA 6.83,  vol. 32.56cc--  s/p  external beam radiation 02-20-2015 to 03-24-2015  . Type 2 diabetes mellitus ([place])   . Type 2 diabetes, diet controlled ([place])   . Wears glasses     Past Surgical History:  Procedure Laterality Date  . COLONOSCOPY W/ POLYPECTOMY  2013  . EYE SURGERY     Cornea replacement left eye (2018)  . JOINT REPLACEMENT    . ORIF LEFT LEG FX  1988  . ORIF RIGHT ANKLE FX  1985  . PROSTATE BIOPSY    . RADIOACTIVE SEED IMPLANT N/A 04/14/2015   Procedure: RADIOACTIVE SEED IMPLANT;  Surgeon: Kathie Rhodes, MD;  Location: [facility];  Service: Urology;  Laterality: N/A;   64     seeds implanted no seeds found in bladder  . TOTAL KNEE ARTHROPLASTY Left 01/16/2021   Procedure: LEFT TOTAL KNEE ARTHROPLASTY;  Surgeon: Meredith Pel, MD;  Location: [place];  Service: Orthopedics;  Laterality: Left;  . TOTAL SHOULDER ARTHROPLASTY Right 05/21/2016   Procedure: TOTAL SHOULDER ARTHROPLASTY;  Surgeon: Meredith Pel, MD;  Location: [place];  Service: Orthopedics;  Laterality: Right;  . TOTAL SHOULDER ARTHROPLASTY Left 10/05/2019   Procedure: LEFT Total shoulder arthroplasty;  Surgeon: Meredith Pel, MD;  Location: [place];  Service: Orthopedics;  Laterality: Left;    There were no vitals filed for this visit.   Subjective Assessment - 02/22/21 1015    Subjective knee is still    Pertinent History OA, HTN, prostate cancer, DM, HIV+    Limitations Standing;Walking;House hold activities    Patient Stated Goals Reduce pain, walk independenty, return to work    Currently in Pain? Yes    Pain Score 3     Pain Location Knee    Pain Orientation Left    Pain Descriptors / Indicators Aching    Pain Type Surgical pain;Acute pain    Pain Onset 1 to 4 weeks ago    Pain Frequency Constant    Aggravating Factors  end of day, end range, car transfers, squats    Pain Relieving Factors meds, rest  [facility] PT Assessment - 02/22/21 1043      Assessment   Medical Diagnosis Lt TKA    Referring Provider (PT) Dr. Marlou Sa    Onset Date/Surgical Date 01/16/21      AROM   Left Knee Extension 0    Left Knee Flexion 102      PROM   Left Knee Flexion 108                         [facility] Adult PT Treatment/Exercise - 02/22/21 1013      Knee/Hip Exercises: Stretches   Knee: Self-Stretch Limitations seated tailgate flexion stretch 10 sec X 10 contract-relax      Knee/Hip Exercises: Aerobic   Recumbent Bike rocking then full revolutions 10 min total      Knee/Hip Exercises: Machines for Strengthening   Cybex Knee Extension 10# LLE 3x10    Cybex Knee Flexion LLE 20# 3x10    Total Gym Leg Press LLE 43# 3x10      Knee/Hip Exercises: Supine   Bridges Both;3 sets;10 reps    Other Supine Knee/Hip  Exercises hooklying LLE march 3x10 with L4 band      Vasopneumatic   Number Minutes Vasopneumatic  10 minutes    Vasopnuematic Location  Knee    Vasopneumatic Pressure High    Vasopneumatic Temperature  34      Manual Therapy   Manual therapy comments Lt knee flexion PROM sitting, with RLE LAQ                    PT Short Term Goals - 02/05/21 1003      PT SHORT TERM GOAL #1   Title Patient will demonstrate independent use of home exercise program to maintain progress from in clinic treatments.    Time 3    Period Weeks    Status New    Target Date 02/26/21             PT Long Term Goals - 02/05/21 1004      PT LONG TERM GOAL #1   Title Patient will demonstrate/report pain at worst less than or equal to 2/10 to facilitate minimal limitation in daily activity secondary to pain symptoms.    Time 10    Period Weeks    Status New    Target Date 04/16/21      PT LONG TERM GOAL #2   Title Patient will demonstrate independent use of home exercise program to facilitate ability to maintain/progress functional gains from skilled physical therapy services.    Time 10    Period Weeks    Status New    Target Date 04/16/21      PT LONG TERM GOAL #3   Title Patient will demonstrate independent ambulation community distances > 300 ft to facilitate community integration at [facility].    Time 10    Period Weeks    Status New    Target Date 04/16/21      PT LONG TERM GOAL #4   Title Patient will demonstrate Lt knee AROM 0-110 degrees to facilitate ability to perform transfers, sitting, ambulation, stair navigation s restriction due to mobility.    Time 10    Period Weeks    Status New    Target Date 04/16/21      PT LONG TERM GOAL #5   Title Patient will demonstrate Lt LE MMT 5/5 throughout to facilitate ability to perform usual standing, walking, stairs  at PLOF s limitation due to symptoms.    Time 10    Period Weeks    Status New    Target Date 04/16/21       Additional Long Term Goals   Additional Long Term Goals Yes      PT LONG TERM GOAL #6   Title Pt. will demonstrate FOTO score > 59 to indicated reduced disability.    Time 10    Period Weeks    Status New    Target Date 04/16/21                 Plan - 02/22/21 1056    Clinical Impression Statement Pt demonstrating excellent progress with ROM today, and slowly demonstrating improved strength.  He likes to use UEs to lift LLE so discussed decreasing reliance on UEs as well as adding some hip strengthening exercises.  Need to check STGs next visit.    Personal Factors and Comorbidities Comorbidity 3+    Comorbidities OA, HTN, prostate cancer, DM, HIV+    Examination-Activity Limitations Sit;Sleep;Squat;Bend;Stairs;Stand;Transfers;Locomotion Level    Stability/Clinical Decision Making Stable/Uncomplicated    Rehab Potential Good    PT Frequency 2x / week   2x/week then transitioning to 1-2x/week as appropraite   PT Duration --   10 weeks   PT Treatment/Interventions ADLs/Self Care Home Management;Cryotherapy;Electrical Stimulation;Moist Heat;Balance training;Therapeutic exercise;Iontophoresis 4mg /ml Dexamethasone;Therapeutic activities;Functional mobility training;Stair training;Gait training;DME Instruction;Neuromuscular re-education;Patient/family education;Manual techniques;Vasopneumatic Device;Taping;Dry needling;Passive range of motion;Spinal Manipulations;Joint Manipulations    PT Next Visit Plan manual for mobility gains, strengthening and AROM/PROM; check STGs    PT Home Exercise Plan [facility]    Consulted and Agree with Plan of Care Patient           Patient will benefit from skilled therapeutic intervention in order to improve the following deficits and impairments:  Pain,Decreased coordination,Impaired flexibility,Impaired perceived functional ability,Improper body mechanics,Difficulty walking,Decreased mobility,Decreased balance,Decreased range of motion,Decreased  strength,Decreased activity tolerance,Increased edema,Hypomobility,Decreased endurance,Abnormal gait  Visit Diagnosis: Left knee pain, unspecified chronicity  Muscle weakness (generalized)  Difficulty in walking, not elsewhere classified  Localized edema     Problem List Patient Active Problem List   Diagnosis Date Noted  . Arthritis of left knee   . S/P total knee arthroplasty, left 01/16/2021  . Gout 07/03/2020  . 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 Healthcare maintenance 06/03/2019  . Human immunodeficiency virus (HIV) disease ([place]) 05/03/2019  . Type 2 diabetes mellitus with hyperglycemia, without long-term current use of insulin ([place]) 05/03/2019  . Essential hypertension 05/03/2019  . Class 1 obesity due to excess calories with serious comorbidity and body mass index (BMI) of 32.0 to 32.9 in adult 05/03/2019  . Degenerative arthritis of shoulder region 05/21/2016  . Malignant neoplasm of prostate ([place]) 11/20/2014  . Travel advice encounter 04/01/2014      Laureen Abrahams, PT, DPT 02/22/21 11:03 AM    [facility] Physical Therapy [address] [place], [place], [address] Phone: [phone]   Fax:  [phone]  Name: William Mcfarland MRN: [id number] Date of Birth: 01/28/1956

## 2021-02-25 ENCOUNTER — Other Ambulatory Visit: Payer: Self-pay | Admitting: Orthopedic Surgery

## 2021-02-26 NOTE — Telephone Encounter (Signed)
Please advise 

## 2021-02-27 ENCOUNTER — Encounter: Payer: Self-pay | Admitting: Physical Therapy

## 2021-02-27 ENCOUNTER — Other Ambulatory Visit: Payer: Self-pay

## 2021-02-27 ENCOUNTER — Ambulatory Visit (INDEPENDENT_AMBULATORY_CARE_PROVIDER_SITE_OTHER): Payer: BC Managed Care – PPO | Admitting: Physical Therapy

## 2021-02-27 DIAGNOSIS — M25562 Pain in left knee: Secondary | ICD-10-CM

## 2021-02-27 DIAGNOSIS — R262 Difficulty in walking, not elsewhere classified: Secondary | ICD-10-CM

## 2021-02-27 DIAGNOSIS — R600 Localized edema: Secondary | ICD-10-CM | POA: Diagnosis not present

## 2021-02-27 DIAGNOSIS — M6281 Muscle weakness (generalized): Secondary | ICD-10-CM

## 2021-02-27 NOTE — Therapy (Signed)
[facility] Physical Therapy [address] [place], [place], [address] Phone: [phone]   Fax:  [phone]  Physical Therapy Treatment  Patient Details  Name: William Mcfarland MRN: [id number] Date of Birth: Dec 19, 1955 Referring Provider (PT): Dr. Marlou Sa   Encounter Date: 02/27/2021   PT End of Session - 02/27/21 1003    Visit Number 7    Number of Visits 16    Date for PT Re-Evaluation 04/16/21    Progress Note Due on Visit 10    PT Start Time 0930    PT Stop Time 1018    PT Time Calculation (min) 48 min    Activity Tolerance Patient tolerated treatment well    Behavior During Therapy [facility] for tasks assessed/performed           Past Medical History:  Diagnosis Date  . Arthritis    shoulders, knees, hips  . At risk for sleep apnea    STOP-BANG= 5     SENT TO PCP 04-12-2015  . History of colon polyps   . History of radiation therapy 45 Gy plus seed implants on 04-13-2015   prostate external beam 02-20-2015 to 03-24-2015  . Hyperlipidemia   . Hypertension   . Lower urinary tract symptoms (LUTS)   . Prostate cancer [facility]) urologist-  dr ottelin/  oncologist- dr Tammi Klippel   T1c, Gleason 4+3,  PSA 6.83,  vol. 32.56cc--  s/p  external beam radiation 02-20-2015 to 03-24-2015  . Type 2 diabetes mellitus ([place])   . Type 2 diabetes, diet controlled ([place])   . Wears glasses     Past Surgical History:  Procedure Laterality Date  . COLONOSCOPY W/ POLYPECTOMY  2013  . EYE SURGERY     Cornea replacement left eye (2018)  . JOINT REPLACEMENT    . ORIF LEFT LEG FX  1988  . ORIF RIGHT ANKLE FX  1985  . PROSTATE BIOPSY    . RADIOACTIVE SEED IMPLANT N/A 04/14/2015   Procedure: RADIOACTIVE SEED IMPLANT;  Surgeon: Kathie Rhodes, MD;  Location: [facility];  Service: Urology;  Laterality: N/A;   64     seeds implanted no seeds found in bladder  . TOTAL KNEE ARTHROPLASTY Left 01/16/2021   Procedure: LEFT TOTAL KNEE ARTHROPLASTY;  Surgeon: Meredith Pel, MD;  Location: [place];  Service: Orthopedics;  Laterality: Left;  . TOTAL SHOULDER ARTHROPLASTY Right 05/21/2016   Procedure: TOTAL SHOULDER ARTHROPLASTY;  Surgeon: Meredith Pel, MD;  Location: [place];  Service: Orthopedics;  Laterality: Right;  . TOTAL SHOULDER ARTHROPLASTY Left 10/05/2019   Procedure: LEFT Total shoulder arthroplasty;  Surgeon: Meredith Pel, MD;  Location: [place];  Service: Orthopedics;  Laterality: Left;    There were no vitals filed for this visit.   Subjective Assessment - 02/27/21 0938    Subjective "knee is still a little swollen and the scar seems irritated". He relays 3/10 overall knee pain today and that he will follow up with MD tommorow.    Pertinent History OA, HTN, prostate cancer, DM, HIV+    Limitations Standing;Walking;House hold activities    Patient Stated Goals Reduce pain, walk independenty, return to work    Pain Onset 1 to 4 weeks ago              [facility] PT Assessment - 02/27/21 0001      Assessment   Medical Diagnosis Lt TKA    Referring Provider (PT) Dr. Marlou Sa    Onset Date/Surgical Date 01/16/21  AROM   Left Knee Extension 0    Left Knee Flexion 105      PROM   Left Knee Flexion 112      Strength   Left Knee Flexion 4+/5    Left Knee Extension 4+/5             [facility] Adult PT Treatment/Exercise - 02/27/21 0001      Knee/Hip Exercises: Stretches   Active Hamstring Stretch Left;2 reps;30 seconds    Active Hamstring Stretch Limitations seated    Knee: Self-Stretch Limitations seated tailgate flexion stretch 10 sec X 10 contract-relax      Knee/Hip Exercises: Aerobic   Recumbent Bike rocking then full revolutions and L2 for  10 min total      Knee/Hip Exercises: Machines for Strengthening   Cybex Knee Extension 20# both LE 3x10    Cybex Knee Flexion LLE 25# 2x10    Total Gym Leg Press LLE 50# 3x10      Knee/Hip Exercises: Standing   Other Standing Knee Exercises hip flexion, abd, ext with green X 15 ea  on Lt      Knee/Hip Exercises: Seated   Sit to Sand 2 sets;10 reps;without UE support      Vasopneumatic   Number Minutes Vasopneumatic  10 minutes    Vasopnuematic Location  Knee    Vasopneumatic Pressure High    Vasopneumatic Temperature  34      Manual Therapy   Manual therapy comments Lt knee PROM and flexion mobs                    PT Short Term Goals - 02/27/21 1013      PT SHORT TERM GOAL #1   Title Patient will demonstrate independent use of home exercise program to maintain progress from in clinic treatments.    Time 3    Period Weeks    Status Achieved    Target Date 02/26/21             PT Long Term Goals - 02/05/21 1004      PT LONG TERM GOAL #1   Title Patient will demonstrate/report pain at worst less than or equal to 2/10 to facilitate minimal limitation in daily activity secondary to pain symptoms.    Time 10    Period Weeks    Status New    Target Date 04/16/21      PT LONG TERM GOAL #2   Title Patient will demonstrate independent use of home exercise program to facilitate ability to maintain/progress functional gains from skilled physical therapy services.    Time 10    Period Weeks    Status New    Target Date 04/16/21      PT LONG TERM GOAL #3   Title Patient will demonstrate independent ambulation community distances > 300 ft to facilitate community integration at [facility].    Time 10    Period Weeks    Status New    Target Date 04/16/21      PT LONG TERM GOAL #4   Title Patient will demonstrate Lt knee AROM 0-110 degrees to facilitate ability to perform transfers, sitting, ambulation, stair navigation s restriction due to mobility.    Time 10    Period Weeks    Status New    Target Date 04/16/21      PT LONG TERM GOAL #5   Title Patient will demonstrate Lt LE MMT 5/5 throughout to facilitate ability to perform  usual standing, walking, stairs at PLOF s limitation due to symptoms.    Time 10    Period Weeks    Status New     Target Date 04/16/21      Additional Long Term Goals   Additional Long Term Goals Yes      PT LONG TERM GOAL #6   Title Pt. will demonstrate FOTO score > 59 to indicated reduced disability.    Time 10    Period Weeks    Status New    Target Date 04/16/21                 Plan - 02/27/21 1005    Clinical Impression Statement He is doing well with ROM and only mild deficit with knee flexion ROM. Strength continues to improve but also still with mld defiicts PT will continue to progress as able and recoomend about 2 more weeks of PT.    Personal Factors and Comorbidities Comorbidity 3+    Comorbidities OA, HTN, prostate cancer, DM, HIV+    Examination-Activity Limitations Sit;Sleep;Squat;Bend;Stairs;Stand;Transfers;Locomotion Level    Stability/Clinical Decision Making Stable/Uncomplicated    Rehab Potential Good    PT Frequency 2x / week   2x/week then transitioning to 1-2x/week as appropraite   PT Duration --   10 weeks   PT Treatment/Interventions ADLs/Self Care Home Management;Cryotherapy;Electrical Stimulation;Moist Heat;Balance training;Therapeutic exercise;Iontophoresis 4mg /ml Dexamethasone;Therapeutic activities;Functional mobility training;Stair training;Gait training;DME Instruction;Neuromuscular re-education;Patient/family education;Manual techniques;Vasopneumatic Device;Taping;Dry needling;Passive range of motion;Spinal Manipulations;Joint Manipulations    PT Next Visit Plan manual for mobility gains, strengthening and AROM/PROM; check STGs    PT Home Exercise Plan [facility]    Consulted and Agree with Plan of Care Patient           Patient will benefit from skilled therapeutic intervention in order to improve the following deficits and impairments:  Pain,Decreased coordination,Impaired flexibility,Impaired perceived functional ability,Improper body mechanics,Difficulty walking,Decreased mobility,Decreased balance,Decreased range of motion,Decreased strength,Decreased  activity tolerance,Increased edema,Hypomobility,Decreased endurance,Abnormal gait  Visit Diagnosis: Left knee pain, unspecified chronicity  Muscle weakness (generalized)  Difficulty in walking, not elsewhere classified  Localized edema     Problem List Patient Active Problem List   Diagnosis Date Noted  . Arthritis of left knee   . S/P total knee arthroplasty, left 01/16/2021  . Gout 07/03/2020  . 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 Healthcare maintenance 06/03/2019  . Human immunodeficiency virus (HIV) disease ([place]) 05/03/2019  . Type 2 diabetes mellitus with hyperglycemia, without long-term current use of insulin ([place]) 05/03/2019  . Essential hypertension 05/03/2019  . Class 1 obesity due to excess calories with serious comorbidity and body mass index (BMI) of 32.0 to 32.9 in adult 05/03/2019  . Degenerative arthritis of shoulder region 05/21/2016  . Malignant neoplasm of prostate ([place]) 11/20/2014  . Travel advice encounter 04/01/2014    Debbe Odea, PT,DPT 02/27/2021, 10:13 AM  [facility] Physical Therapy [address] [place], [place], [address] Phone: [phone]   Fax:  [phone]  Name: William Mcfarland MRN: [id number] Date of Birth: November 08, 1956

## 2021-02-28 ENCOUNTER — Encounter: Payer: Self-pay | Admitting: Orthopedic Surgery

## 2021-02-28 ENCOUNTER — Ambulatory Visit (INDEPENDENT_AMBULATORY_CARE_PROVIDER_SITE_OTHER): Payer: BC Managed Care – PPO | Admitting: Orthopedic Surgery

## 2021-02-28 DIAGNOSIS — Z96652 Presence of left artificial knee joint: Secondary | ICD-10-CM

## 2021-02-28 NOTE — Progress Notes (Signed)
Post-Op Visit Note   Patient: William Mcfarland           Date of Birth: August 07, 1956           MRN: [id number] Visit Date: 02/28/2021 PCP: Kathyrn Lass, MD   Assessment & Plan:  Chief Complaint:  Chief Complaint  Patient presents with  . Left Knee - Routine Post Op   Visit Diagnoses:  1. Status post total left knee replacement     Plan: Patient is a 65 year old male who is s/p left total knee arthroplasty on 01/16/2021.  Complains of increased hamstring and lateral thigh pain at the left lower extremity.  This is improving with time of time out from surgery.  He is going to physical therapy 2 times per week.  Only has to take pain medicine prior to bed.  Wants to try melatonin to help him sleep, sure that this is okay.  0 degrees of extension and 100 100 degrees of flexion today.  Incision is healing well.  He reaches 1 to is 112 degrees of flexion in therapy.  No calf tenderness.  Negative Homans' sign.  He is able to do stairs in reciprocal fashion.  Overall he is progressing well and plan to follow-up in 6 weeks for final check.  Follow-Up Instructions: No follow-ups on file.   Orders:  No orders of the defined types were placed in this encounter.  No orders of the defined types were placed in this encounter.   Imaging: No results found.  PMFS History: Patient Active Problem List   Diagnosis Date Noted  . Arthritis of left knee   . S/P total knee arthroplasty, left 01/16/2021  . Gout 07/03/2020  . 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 Healthcare maintenance 06/03/2019  . Human immunodeficiency virus (HIV) disease ([place]) 05/03/2019  . Type 2 diabetes mellitus with hyperglycemia, without long-term current use of insulin ([place]) 05/03/2019  . Essential hypertension 05/03/2019  . Class 1 obesity due to excess calories with serious comorbidity and body mass index (BMI) of 32.0 to 32.9 in adult 05/03/2019  . Degenerative arthritis of shoulder region 05/21/2016  .  Malignant neoplasm of prostate ([place]) 11/20/2014  . Travel advice encounter 04/01/2014   Past Medical History:  Diagnosis Date  . Arthritis    shoulders, knees, hips  . At risk for sleep apnea    STOP-BANG= 5     SENT TO PCP 04-12-2015  . History of colon polyps   . History of radiation therapy 45 Gy plus seed implants on 04-13-2015   prostate external beam 02-20-2015 to 03-24-2015  . Hyperlipidemia   . Hypertension   . Lower urinary tract symptoms (LUTS)   . Prostate cancer [facility]) urologist-  dr ottelin/  oncologist- dr Tammi Klippel   T1c, Gleason 4+3,  PSA 6.83,  vol. 32.56cc--  s/p  external beam radiation 02-20-2015 to 03-24-2015  . Type 2 diabetes mellitus ([place])   . Type 2 diabetes, diet controlled ([place])   . Wears glasses     Family History  Problem Relation Age of Onset  . Stroke Father   . Stroke Mother   . Cancer Neg Hx     Past Surgical History:  Procedure Laterality Date  . COLONOSCOPY W/ POLYPECTOMY  2013  . EYE SURGERY     Cornea replacement left eye (2018)  . JOINT REPLACEMENT    . ORIF LEFT LEG FX  1988  . ORIF RIGHT ANKLE FX  1985  . PROSTATE BIOPSY    .  RADIOACTIVE SEED IMPLANT N/A 04/14/2015   Procedure: RADIOACTIVE SEED IMPLANT;  Surgeon: Kathie Rhodes, MD;  Location: [facility];  Service: Urology;  Laterality: N/A;   64     seeds implanted no seeds found in bladder  . TOTAL KNEE ARTHROPLASTY Left 01/16/2021   Procedure: LEFT TOTAL KNEE ARTHROPLASTY;  Surgeon: Meredith Pel, MD;  Location: [place];  Service: Orthopedics;  Laterality: Left;  . TOTAL SHOULDER ARTHROPLASTY Right 05/21/2016   Procedure: TOTAL SHOULDER ARTHROPLASTY;  Surgeon: Meredith Pel, MD;  Location: [place];  Service: Orthopedics;  Laterality: Right;  . TOTAL SHOULDER ARTHROPLASTY Left 10/05/2019   Procedure: LEFT Total shoulder arthroplasty;  Surgeon: Meredith Pel, MD;  Location: [place];  Service: Orthopedics;  Laterality: Left;   Social History   Occupational  History  . Occupation: Air traffic controller  Tobacco Use  . Smoking status: Former Smoker    Packs/day: 0.50    Years: 15.00    Pack years: 7.50    Types: Cigarettes    Quit date: 04/12/1999    Years since quitting: 21.8  . Smokeless tobacco: Never Used  Vaping Use  . Vaping Use: Never used  Substance and Sexual Activity  . Alcohol use: Yes    Comment: occasional  . Drug use: No  . Sexual activity: Not on file

## 2021-03-01 ENCOUNTER — Other Ambulatory Visit: Payer: Self-pay

## 2021-03-01 ENCOUNTER — Ambulatory Visit (INDEPENDENT_AMBULATORY_CARE_PROVIDER_SITE_OTHER): Payer: BC Managed Care – PPO | Admitting: Physical Therapy

## 2021-03-01 DIAGNOSIS — M25562 Pain in left knee: Secondary | ICD-10-CM | POA: Diagnosis not present

## 2021-03-01 DIAGNOSIS — R262 Difficulty in walking, not elsewhere classified: Secondary | ICD-10-CM | POA: Diagnosis not present

## 2021-03-01 DIAGNOSIS — R600 Localized edema: Secondary | ICD-10-CM | POA: Diagnosis not present

## 2021-03-01 DIAGNOSIS — M6281 Muscle weakness (generalized): Secondary | ICD-10-CM

## 2021-03-01 NOTE — Therapy (Signed)
[facility] Physical Therapy [address] [place], [place], [address] Phone: [phone]   Fax:  [phone]  Physical Therapy Treatment  Patient Details  Name: William Mcfarland MRN: [id number] Date of Birth: Jun 28, 1956 Referring Provider (PT): Dr. Marlou Sa   Encounter Date: 03/01/2021   PT End of Session - 03/01/21 1127    Visit Number 8    Number of Visits 16    Date for PT Re-Evaluation 04/16/21    Progress Note Due on Visit 10    PT Start Time 1100    PT Stop Time 1133    PT Time Calculation (min) 33 min    Activity Tolerance Patient tolerated treatment well    Behavior During Therapy [facility] for tasks assessed/performed           Past Medical History:  Diagnosis Date  . Arthritis    shoulders, knees, hips  . At risk for sleep apnea    STOP-BANG= 5     SENT TO PCP 04-12-2015  . History of colon polyps   . History of radiation therapy 45 Gy plus seed implants on 04-13-2015   prostate external beam 02-20-2015 to 03-24-2015  . Hyperlipidemia   . Hypertension   . Lower urinary tract symptoms (LUTS)   . Prostate cancer [facility]) urologist-  dr ottelin/  oncologist- dr Tammi Klippel   T1c, Gleason 4+3,  PSA 6.83,  vol. 32.56cc--  s/p  external beam radiation 02-20-2015 to 03-24-2015  . Type 2 diabetes mellitus ([place])   . Type 2 diabetes, diet controlled ([place])   . Wears glasses     Past Surgical History:  Procedure Laterality Date  . COLONOSCOPY W/ POLYPECTOMY  2013  . EYE SURGERY     Cornea replacement left eye (2018)  . JOINT REPLACEMENT    . ORIF LEFT LEG FX  1988  . ORIF RIGHT ANKLE FX  1985  . PROSTATE BIOPSY    . RADIOACTIVE SEED IMPLANT N/A 04/14/2015   Procedure: RADIOACTIVE SEED IMPLANT;  Surgeon: Kathie Rhodes, MD;  Location: [facility];  Service: Urology;  Laterality: N/A;   64     seeds implanted no seeds found in bladder  . TOTAL KNEE ARTHROPLASTY Left 01/16/2021   Procedure: LEFT TOTAL KNEE ARTHROPLASTY;  Surgeon: Meredith Pel, MD;  Location: [place];  Service: Orthopedics;  Laterality: Left;  . TOTAL SHOULDER ARTHROPLASTY Right 05/21/2016   Procedure: TOTAL SHOULDER ARTHROPLASTY;  Surgeon: Meredith Pel, MD;  Location: [place];  Service: Orthopedics;  Laterality: Right;  . TOTAL SHOULDER ARTHROPLASTY Left 10/05/2019   Procedure: LEFT Total shoulder arthroplasty;  Surgeon: Meredith Pel, MD;  Location: [place];  Service: Orthopedics;  Laterality: Left;    There were no vitals filed for this visit.   Subjective Assessment - 03/01/21 1125    Subjective he relays his hip/knee feels off today, he relays not much pain in his knee but his hip is hurting more and he mentioned that to MD yesterday during his F/U. He has been up/down the stairs a lot doing laundry.    Pertinent History OA, HTN, prostate cancer, DM, HIV+    Limitations Standing;Walking;House hold activities    Patient Stated Goals Reduce pain, walk independenty, return to work    Pain Onset 1 to 4 weeks ago                             [facility] Adult PT Treatment/Exercise -  03/01/21 0001      Knee/Hip Exercises: Stretches   Active Hamstring Stretch Left;2 reps;30 seconds    Active Hamstring Stretch Limitations seated      Knee/Hip Exercises: Aerobic   Recumbent Bike 5 min but painful on his hip so discontinued    Nustep 10 min L7      Knee/Hip Exercises: Machines for Strengthening   Total Gym Leg Press LLE 50# 3x10      Vasopneumatic   Number Minutes Vasopneumatic  10 minutes    Vasopnuematic Location  Knee    Vasopneumatic Pressure High    Vasopneumatic Temperature  34                    PT Short Term Goals - 02/27/21 1013      PT SHORT TERM GOAL #1   Title Patient will demonstrate independent use of home exercise program to maintain progress from in clinic treatments.    Time 3    Period Weeks    Status Achieved    Target Date 02/26/21             PT Long Term Goals - 02/05/21 1004      PT  LONG TERM GOAL #1   Title Patient will demonstrate/report pain at worst less than or equal to 2/10 to facilitate minimal limitation in daily activity secondary to pain symptoms.    Time 10    Period Weeks    Status New    Target Date 04/16/21      PT LONG TERM GOAL #2   Title Patient will demonstrate independent use of home exercise program to facilitate ability to maintain/progress functional gains from skilled physical therapy services.    Time 10    Period Weeks    Status New    Target Date 04/16/21      PT LONG TERM GOAL #3   Title Patient will demonstrate independent ambulation community distances > 300 ft to facilitate community integration at [facility].    Time 10    Period Weeks    Status New    Target Date 04/16/21      PT LONG TERM GOAL #4   Title Patient will demonstrate Lt knee AROM 0-110 degrees to facilitate ability to perform transfers, sitting, ambulation, stair navigation s restriction due to mobility.    Time 10    Period Weeks    Status New    Target Date 04/16/21      PT LONG TERM GOAL #5   Title Patient will demonstrate Lt LE MMT 5/5 throughout to facilitate ability to perform usual standing, walking, stairs at PLOF s limitation due to symptoms.    Time 10    Period Weeks    Status New    Target Date 04/16/21      Additional Long Term Goals   Additional Long Term Goals Yes      PT LONG TERM GOAL #6   Title Pt. will demonstrate FOTO score > 59 to indicated reduced disability.    Time 10    Period Weeks    Status New    Target Date 04/16/21                 Plan - 03/01/21 1130    Clinical Impression Statement he had more hip pain with the bike today so this was discontinued and moved to nu step, then he continued to have [name] with exercises so ended session early at  his request. He he does have history of Lt hip OA so he is concerned about this but let MD know about it yesterday at his follow up. He was advised to decrease his overall  activity until hip pain resolves then we will gradually progress back activity as tolerated.    Personal Factors and Comorbidities Comorbidity 3+    Comorbidities OA, HTN, prostate cancer, DM, HIV+    Examination-Activity Limitations Sit;Sleep;Squat;Bend;Stairs;Stand;Transfers;Locomotion Level    Stability/Clinical Decision Making Stable/Uncomplicated    Rehab Potential Good    PT Frequency 2x / week   2x/week then transitioning to 1-2x/week as appropraite   PT Duration --   10 weeks   PT Treatment/Interventions ADLs/Self Care Home Management;Cryotherapy;Electrical Stimulation;Moist Heat;Balance training;Therapeutic exercise;Iontophoresis 4mg /ml Dexamethasone;Therapeutic activities;Functional mobility training;Stair training;Gait training;DME Instruction;Neuromuscular re-education;Patient/family education;Manual techniques;Vasopneumatic Device;Taping;Dry needling;Passive range of motion;Spinal Manipulations;Joint Manipulations    PT Next Visit Plan manual for mobility gains, strengthening and AROM/PROM; check STGs    PT Home Exercise Plan [facility]    Consulted and Agree with Plan of Care Patient           Patient will benefit from skilled therapeutic intervention in order to improve the following deficits and impairments:  Pain,Decreased coordination,Impaired flexibility,Impaired perceived functional ability,Improper body mechanics,Difficulty walking,Decreased mobility,Decreased balance,Decreased range of motion,Decreased strength,Decreased activity tolerance,Increased edema,Hypomobility,Decreased endurance,Abnormal gait  Visit Diagnosis: Left knee pain, unspecified chronicity  Muscle weakness (generalized)  Difficulty in walking, not elsewhere classified  Localized edema     Problem List Patient Active Problem List   Diagnosis Date Noted  . Arthritis of left knee   . S/P total knee arthroplasty, left 01/16/2021  . Gout 07/03/2020  . 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  Healthcare maintenance 06/03/2019  . Human immunodeficiency virus (HIV) disease ([place]) 05/03/2019  . Type 2 diabetes mellitus with hyperglycemia, without long-term current use of insulin ([place]) 05/03/2019  . Essential hypertension 05/03/2019  . Class 1 obesity due to excess calories with serious comorbidity and body mass index (BMI) of 32.0 to 32.9 in adult 05/03/2019  . Degenerative arthritis of shoulder region 05/21/2016  . Malignant neoplasm of prostate ([place]) 11/20/2014  . Travel advice encounter 04/01/2014    Silvestre Mesi 03/01/2021, 11:32 AM  [facility] Physical Therapy [address] [place], [place], [address] Phone: [phone]   Fax:  [phone]  Name: William Mcfarland MRN: [id number] Date of Birth: 26-Oct-1956

## 2021-03-06 ENCOUNTER — Encounter: Payer: Self-pay | Admitting: Physical Therapy

## 2021-03-06 ENCOUNTER — Encounter: Payer: Self-pay | Admitting: Family

## 2021-03-06 ENCOUNTER — Ambulatory Visit (INDEPENDENT_AMBULATORY_CARE_PROVIDER_SITE_OTHER): Payer: BC Managed Care – PPO | Admitting: Family

## 2021-03-06 ENCOUNTER — Other Ambulatory Visit: Payer: Self-pay

## 2021-03-06 ENCOUNTER — Ambulatory Visit (INDEPENDENT_AMBULATORY_CARE_PROVIDER_SITE_OTHER): Payer: BC Managed Care – PPO | Admitting: Physical Therapy

## 2021-03-06 VITALS — BP 146/86 | HR 89 | Temp 98.0°F | Ht 66.0 in | Wt 213.0 lb

## 2021-03-06 DIAGNOSIS — R600 Localized edema: Secondary | ICD-10-CM | POA: Diagnosis not present

## 2021-03-06 DIAGNOSIS — M6281 Muscle weakness (generalized): Secondary | ICD-10-CM | POA: Diagnosis not present

## 2021-03-06 DIAGNOSIS — R262 Difficulty in walking, not elsewhere classified: Secondary | ICD-10-CM | POA: Diagnosis not present

## 2021-03-06 DIAGNOSIS — M25562 Pain in left knee: Secondary | ICD-10-CM

## 2021-03-06 DIAGNOSIS — Z0000 Encounter for general adult medical examination without abnormal findings: Secondary | ICD-10-CM | POA: Diagnosis not present

## 2021-03-06 DIAGNOSIS — B20 Human immunodeficiency virus [HIV] disease: Secondary | ICD-10-CM

## 2021-03-06 MED ORDER — BICTEGRAVIR-EMTRICITAB-TENOFOV 50-200-25 MG PO TABS
1.0000 | ORAL_TABLET | Freq: Every day | ORAL | 5 refills | Status: DC
Start: 2021-03-06 — End: 2021-09-06

## 2021-03-06 NOTE — Assessment & Plan Note (Signed)
   Discussed importance of safe sexual practice to reduce risk of STI.  Condoms declined.  Routine dental care is up-to-date per recommendations.  Vaccines up-to-date per recommendations.

## 2021-03-06 NOTE — Therapy (Signed)
[facility] Physical Therapy [address] [place], [place], [address] Phone: [phone]   Fax:  [phone]  Physical Therapy Treatment  Patient Details  Name: William Mcfarland MRN: [id number] Date of Birth: Apr 28, 1956 Referring Provider (PT): Dr. Marlou Sa   Encounter Date: 03/06/2021   PT End of Session - 03/06/21 1046    Visit Number 9    Number of Visits 16    Date for PT Re-Evaluation 04/16/21    Progress Note Due on Visit 10    PT Start Time 1015    PT Stop Time 1055    PT Time Calculation (min) 40 min    Activity Tolerance Patient tolerated treatment well    Behavior During Therapy [facility] for tasks assessed/performed           Past Medical History:  Diagnosis Date  . Arthritis    shoulders, knees, hips  . At risk for sleep apnea    STOP-BANG= 5     SENT TO PCP 04-12-2015  . History of colon polyps   . History of radiation therapy 45 Gy plus seed implants on 04-13-2015   prostate external beam 02-20-2015 to 03-24-2015  . Hyperlipidemia   . Hypertension   . Lower urinary tract symptoms (LUTS)   . Prostate cancer [facility]) urologist-  dr ottelin/  oncologist- dr Tammi Klippel   T1c, Gleason 4+3,  PSA 6.83,  vol. 32.56cc--  s/p  external beam radiation 02-20-2015 to 03-24-2015  . Type 2 diabetes mellitus ([place])   . Type 2 diabetes, diet controlled ([place])   . Wears glasses     Past Surgical History:  Procedure Laterality Date  . COLONOSCOPY W/ POLYPECTOMY  2013  . EYE SURGERY     Cornea replacement left eye (2018)  . JOINT REPLACEMENT    . ORIF LEFT LEG FX  1988  . ORIF RIGHT ANKLE FX  1985  . PROSTATE BIOPSY    . RADIOACTIVE SEED IMPLANT N/A 04/14/2015   Procedure: RADIOACTIVE SEED IMPLANT;  Surgeon: Kathie Rhodes, MD;  Location: [facility];  Service: Urology;  Laterality: N/A;   64     seeds implanted no seeds found in bladder  . TOTAL KNEE ARTHROPLASTY Left 01/16/2021   Procedure: LEFT TOTAL KNEE ARTHROPLASTY;  Surgeon: Meredith Pel, MD;  Location: [place];  Service: Orthopedics;  Laterality: Left;  . TOTAL SHOULDER ARTHROPLASTY Right 05/21/2016   Procedure: TOTAL SHOULDER ARTHROPLASTY;  Surgeon: Meredith Pel, MD;  Location: [place];  Service: Orthopedics;  Laterality: Right;  . TOTAL SHOULDER ARTHROPLASTY Left 10/05/2019   Procedure: LEFT Total shoulder arthroplasty;  Surgeon: Meredith Pel, MD;  Location: [place];  Service: Orthopedics;  Laterality: Left;    There were no vitals filed for this visit.   Subjective Assessment - 03/06/21 1016    Subjective Lt hip has been very painful the past week; Lt knee "is great."    Pertinent History OA, HTN, prostate cancer, DM, HIV+    Limitations Standing;Walking;House hold activities    Patient Stated Goals Reduce pain, walk independenty, return to work    Currently in Pain? Yes    Pain Score 0-No pain   Lt hip 5/10                            [facility] Adult PT Treatment/Exercise - 03/06/21 1017      Knee/Hip Exercises: Stretches   Quad Stretch Left;5 reps;20 seconds  Quad Stretch Limitations supine with strap with hip flexor stretch      Knee/Hip Exercises: Aerobic   Recumbent Bike L1-4 x 8 min      Knee/Hip Exercises: Machines for Strengthening   Total Gym Leg Press LLE 50# 3x10      Knee/Hip Exercises: Standing   Other Standing Knee Exercises RDL 3x10; 10# KB      Knee/Hip Exercises: Supine   Heel Slides AAROM;Left;1 set;10 reps    Bridges Both;15 reps      Vasopneumatic   Number Minutes Vasopneumatic  10 minutes    Vasopnuematic Location  Knee    Vasopneumatic Pressure High    Vasopneumatic Temperature  34                    PT Short Term Goals - 02/27/21 1013      PT SHORT TERM GOAL #1   Title Patient will demonstrate independent use of home exercise program to maintain progress from in clinic treatments.    Time 3    Period Weeks    Status Achieved    Target Date 02/26/21             PT Long Term Goals  - 03/06/21 1046      PT LONG TERM GOAL #1   Title Patient will demonstrate/report pain at worst less than or equal to 2/10 to facilitate minimal limitation in daily activity secondary to pain symptoms.    Time 10    Period Weeks    Status On-going    Target Date 04/16/21      PT LONG TERM GOAL #2   Title Patient will demonstrate independent use of home exercise program to facilitate ability to maintain/progress functional gains from skilled physical therapy services.    Time 10    Period Weeks    Status On-going    Target Date 04/16/21      PT LONG TERM GOAL #3   Title Patient will demonstrate independent ambulation community distances > 300 ft to facilitate community integration at [facility].    Time 10    Period Weeks    Status Achieved      PT LONG TERM GOAL #4   Title Patient will demonstrate Lt knee AROM 0-110 degrees to facilitate ability to perform transfers, sitting, ambulation, stair navigation s restriction due to mobility.    Time 10    Period Weeks    Status On-going    Target Date 04/16/21      PT LONG TERM GOAL #5   Title Patient will demonstrate Lt LE MMT 5/5 throughout to facilitate ability to perform usual standing, walking, stairs at PLOF s limitation due to symptoms.    Time 10    Period Weeks    Status On-going    Target Date 04/16/21      PT LONG TERM GOAL #6   Title Pt. will demonstrate FOTO score > 59 to indicated reduced disability.    Time 10    Period Weeks    Status On-going    Target Date 04/16/21                 Plan - 03/06/21 1047    Clinical Impression Statement Pt tolerated session well today but limited due to continued [name].  Continues to demonstrate good functional mobility related to knee.  Will continue to benefit from PT to maximize function.    Personal Factors and Comorbidities Comorbidity 3+  Comorbidities OA, HTN, prostate cancer, DM, HIV+    Examination-Activity Limitations  Sit;Sleep;Squat;Bend;Stairs;Stand;Transfers;Locomotion Level    Stability/Clinical Decision Making Stable/Uncomplicated    Rehab Potential Good    PT Frequency 2x / week   2x/week then transitioning to 1-2x/week as appropraite   PT Duration --   10 weeks   PT Treatment/Interventions ADLs/Self Care Home Management;Cryotherapy;Electrical Stimulation;Moist Heat;Balance training;Therapeutic exercise;Iontophoresis 4mg /ml Dexamethasone;Therapeutic activities;Functional mobility training;Stair training;Gait training;DME Instruction;Neuromuscular re-education;Patient/family education;Manual techniques;Vasopneumatic Device;Taping;Dry needling;Passive range of motion;Spinal Manipulations;Joint Manipulations    PT Next Visit Plan manual for mobility gains, strengthening and AROM/PROM; get FOTO status    PT Home Exercise Plan [facility]    Consulted and Agree with Plan of Care Patient           Patient will benefit from skilled therapeutic intervention in order to improve the following deficits and impairments:  Pain,Decreased coordination,Impaired flexibility,Impaired perceived functional ability,Improper body mechanics,Difficulty walking,Decreased mobility,Decreased balance,Decreased range of motion,Decreased strength,Decreased activity tolerance,Increased edema,Hypomobility,Decreased endurance,Abnormal gait  Visit Diagnosis: Left knee pain, unspecified chronicity  Muscle weakness (generalized)  Difficulty in walking, not elsewhere classified  Localized edema     Problem List Patient Active Problem List   Diagnosis Date Noted  . Arthritis of left knee   . S/P total knee arthroplasty, left 01/16/2021  . Gout 07/03/2020  . 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 Healthcare maintenance 06/03/2019  . Human immunodeficiency virus (HIV) disease ([place]) 05/03/2019  . Type 2 diabetes mellitus with hyperglycemia, without long-term current use of insulin ([place]) 05/03/2019  . Essential hypertension  05/03/2019  . Class 1 obesity due to excess calories with serious comorbidity and body mass index (BMI) of 32.0 to 32.9 in adult 05/03/2019  . Degenerative arthritis of shoulder region 05/21/2016  . Malignant neoplasm of prostate ([place]) 11/20/2014  . Travel advice encounter 04/01/2014      Laureen Abrahams, PT, DPT 03/06/21 10:48 AM   [facility] Physical Therapy [address] [place], [place], [address] Phone: [phone]   Fax:  [phone]  Name: William Mcfarland MRN: [id number] Date of Birth: September 13, 1956

## 2021-03-06 NOTE — Assessment & Plan Note (Signed)
William Mcfarland continues have well-controlled HIV disease with good adherence and tolerance to his ART regimen of Biktarvy.  No signs/symptoms of opportunistic infection or progressive HIV disease.  We reviewed lab work and discussed plan of care.  Continue current dose of Biktarvy.  Plan for follow-up in 6 months or sooner if needed with lab work 1 to 2 weeks prior to appointment.

## 2021-03-06 NOTE — Progress Notes (Signed)
Brief Narrative   Patient ID: William Mcfarland, male    DOB: Apr 23, 1956, 65 y.o.   MRN: [id number]  Mr. Scioneaux is a white male initially diagnosed with HIV on 03/20/19 with initial viral load of 1.24 million and CD4 count of 690. [id number] negative. Genotype was without significant mutations. Risk factor for HIV is MSM. No history of opportunistic infection.  Subjective:    Chief Complaint  Patient presents with  . HIV Positive/AIDS    HPI:  William Mcfarland is a 65 y.o. male with HIV disease last seen on 10/17/2020 with well-controlled virus and good adherence and tolerance to his ART regimen of Biktarvy.  Lab work showed a viral load that was undetectable with CD4 count of 1145.  Most recent blood work completed on 02/19/2021 with viral load that remains undetectable and CD4 count of 1071.  Kidney function, liver function, electrolytes within normal ranges.  Triglycerides elevated at 167, LDL 77 and HDL 48.  Here today for routine follow-up.  William Mcfarland continues to take his Biktarvy daily as prescribed with no adverse side effects or missed doses since his last office visit.  Feeling okay today.  Had a total knee replacement about 7 weeks ago and has been out of work. Denies fevers, chills, night sweats, headaches, changes in vision, neck pain/stiffness, nausea, diarrhea, vomiting, lesions or rashes.  William Mcfarland has no problems obtaining medication from the pharmacy and remains covered through [facility].  Feels like he has feelings of depression coming out at times primarily related to him being out of work and most recent surgery.  No recreational or illicit drug use or tobacco use.  Alcohol consumption is on occasion.  Declines condoms.  Routine dental care is up-to-date per recommendations.   No Known Allergies    Outpatient Medications Prior to Visit  Medication Sig Dispense Refill  . atorvastatin (LIPITOR) 10 MG tablet Take 10 mg by mouth daily in the  afternoon.     . celecoxib (CELEBREX) 100 MG capsule TAKE 1 CAPSULE(100 MG) BY MOUTH TWICE DAILY 60 capsule 2  . diclofenac (FLECTOR) 1.3 % PTCH Place 1 patch onto the skin 2 (two) times daily. 60 patch 0  . gabapentin (NEURONTIN) 300 MG capsule TAKE 1 CAPSULE(300 MG) BY MOUTH TWICE DAILY FOR 7 DAYS THEN TAKE 1 CAPSULE(300 MG) BY MOUTH DAILY FOR 7 DAYS 21 capsule 0  . lisinopril-hydrochlorothiazide (ZESTORETIC) 20-12.5 MG tablet Take 1 tablet by mouth 2 (two) times a day.    . metFORMIN (GLUCOPHAGE) 500 MG tablet Take 500 mg by mouth 2 (two) times daily with a meal.    . methocarbamol (ROBAXIN) 500 MG tablet Take 1 tablet (500 mg total) by mouth every 8 (eight) hours as needed. 30 tablet 0  . oxyCODONE-acetaminophen (PERCOCET) 5-325 MG tablet Take 1 tablet by mouth every 8 (eight) hours as needed for severe pain. 30 tablet 0  . bictegravir-emtricitabine-tenofovir AF (BIKTARVY) 50-200-25 MG TABS tablet Take 1 tablet by mouth daily. 30 tablet 5   No facility-administered medications prior to visit.     Past Medical History:  Diagnosis Date  . Arthritis    shoulders, knees, hips  . At risk for sleep apnea    STOP-BANG= 5     SENT TO PCP 04-12-2015  . History of colon polyps   . History of radiation therapy 45 Gy plus seed implants on 04-13-2015   prostate external beam 02-20-2015 to 03-24-2015  . Hyperlipidemia   .  Hypertension   . Lower urinary tract symptoms (LUTS)   . Prostate cancer [facility]) urologist-  dr ottelin/  oncologist- dr Tammi Klippel   T1c, Gleason 4+3,  PSA 6.83,  vol. 32.56cc--  s/p  external beam radiation 02-20-2015 to 03-24-2015  . Type 2 diabetes mellitus ([place])   . Type 2 diabetes, diet controlled ([place])   . Wears glasses      Past Surgical History:  Procedure Laterality Date  . COLONOSCOPY W/ POLYPECTOMY  2013  . EYE SURGERY     Cornea replacement left eye (2018)  . JOINT REPLACEMENT    . ORIF LEFT LEG FX  1988  . ORIF RIGHT ANKLE FX  1985  . PROSTATE BIOPSY    .  RADIOACTIVE SEED IMPLANT N/A 04/14/2015   Procedure: RADIOACTIVE SEED IMPLANT;  Surgeon: Kathie Rhodes, MD;  Location: [facility];  Service: Urology;  Laterality: N/A;   64     seeds implanted no seeds found in bladder  . TOTAL KNEE ARTHROPLASTY Left 01/16/2021   Procedure: LEFT TOTAL KNEE ARTHROPLASTY;  Surgeon: Meredith Pel, MD;  Location: [place];  Service: Orthopedics;  Laterality: Left;  . TOTAL SHOULDER ARTHROPLASTY Right 05/21/2016   Procedure: TOTAL SHOULDER ARTHROPLASTY;  Surgeon: Meredith Pel, MD;  Location: [place];  Service: Orthopedics;  Laterality: Right;  . TOTAL SHOULDER ARTHROPLASTY Left 10/05/2019   Procedure: LEFT Total shoulder arthroplasty;  Surgeon: Meredith Pel, MD;  Location: [place];  Service: Orthopedics;  Laterality: Left;       Review of Systems  Constitutional: Negative for appetite change, chills, fatigue, fever and unexpected weight change.  Eyes: Negative for visual disturbance.  Respiratory: Negative for cough, chest tightness, shortness of breath and wheezing.   Cardiovascular: Negative for chest pain and leg swelling.  Gastrointestinal: Negative for abdominal pain, constipation, diarrhea, nausea and vomiting.  Genitourinary: Negative for dysuria, flank pain, frequency, genital sores, hematuria and urgency.  Skin: Negative for rash.  Allergic/Immunologic: Negative for immunocompromised state.  Neurological: Negative for dizziness and headaches.      Objective:    BP (!) 146/86   Pulse 89   Temp 98 F (36.7 C)   Ht 5\' 6"  (1.676 m)   Wt 213 lb (96.6 kg)   BMI 34.38 kg/m  Nursing note and vital signs reviewed.  Physical Exam Constitutional:      General: He is not in acute distress.    Appearance: He is well-developed.  Eyes:     Conjunctiva/sclera: Conjunctivae normal.  Cardiovascular:     Rate and Rhythm: Normal rate and regular rhythm.     Heart sounds: Normal heart sounds. No murmur heard. No friction rub. No  gallop.   Pulmonary:     Effort: Pulmonary effort is normal. No respiratory distress.     Breath sounds: Normal breath sounds. No wheezing or rales.  Chest:     Chest wall: No tenderness.  Abdominal:     General: Bowel sounds are normal.     Palpations: Abdomen is soft.     Tenderness: There is no abdominal tenderness.  Musculoskeletal:     Cervical back: Neck supple.  Lymphadenopathy:     Cervical: No cervical adenopathy.  Skin:    General: Skin is warm and dry.     Findings: No rash.  Neurological:     Mental Status: He is alert and oriented to person, place, and time.  Psychiatric:        Behavior: Behavior normal.  Thought Content: Thought content normal.        Judgment: Judgment normal.      Depression screen [facility] 2/9 03/06/2021 10/17/2020 07/03/2020 10/18/2019 07/19/2019  Decreased Interest 0 0 0 0 0  Down, Depressed, Hopeless 0 1 0 0 0  PHQ - 2 Score 0 1 0 0 0  Altered sleeping - - - - -  Tired, decreased energy - - - - -  Change in appetite - - - - -  Feeling bad or failure about yourself  - - - - -  Trouble concentrating - - - - -  Moving slowly or fidgety/restless - - - - -  Suicidal thoughts - - - - -  PHQ-9 Score - - - - -       Assessment & Plan:    Patient Active Problem List   Diagnosis Date Noted  . Arthritis of left knee   . S/P total knee arthroplasty, left 01/16/2021  . Gout 07/03/2020  . 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 Healthcare maintenance 06/03/2019  . Human immunodeficiency virus (HIV) disease ([place]) 05/03/2019  . Type 2 diabetes mellitus with hyperglycemia, without long-term current use of insulin ([place]) 05/03/2019  . Essential hypertension 05/03/2019  . Class 1 obesity due to excess calories with serious comorbidity and body mass index (BMI) of 32.0 to 32.9 in adult 05/03/2019  . Degenerative arthritis of shoulder region 05/21/2016  . Malignant neoplasm of prostate ([place]) 11/20/2014  . Travel advice encounter 04/01/2014      Problem List Items Addressed This Visit      Other   Human immunodeficiency virus (HIV) disease ([place]) - Primary    Mr. Suski continues have well-controlled HIV disease with good adherence and tolerance to his ART regimen of Biktarvy.  No signs/symptoms of opportunistic infection or progressive HIV disease.  We reviewed lab work and discussed plan of care.  Continue current dose of Biktarvy.  Plan for follow-up in 6 months or sooner if needed with lab work 1 to 2 weeks prior to appointment.      Relevant Medications   bictegravir-emtricitabine-tenofovir AF (BIKTARVY) 50-200-25 MG TABS tablet   Other Relevant Orders   Comprehensive metabolic panel   HIV-1 RNA quant-no reflex-bld   T-helper cell (CD4)- ([facility] clinic only)   Healthcare maintenance     Discussed importance of safe sexual practice to reduce risk of STI.  Condoms declined.  Routine dental care is up-to-date per recommendations.  Vaccines up-to-date per recommendations.          I am having Lacie Draft maintain his lisinopril-hydrochlorothiazide, atorvastatin, metFORMIN, diclofenac, methocarbamol, oxyCODONE-acetaminophen, gabapentin, celecoxib, and bictegravir-emtricitabine-tenofovir AF.   Meds ordered this encounter  Medications  . bictegravir-emtricitabine-tenofovir AF (BIKTARVY) 50-200-25 MG TABS tablet    Sig: Take 1 tablet by mouth daily.    Dispense:  30 tablet    Refill:  5    Order Specific Question:   Supervising Provider    Answer:   Carlyle Basques [[id number]]     Follow-up: Return in about 6 months (around 09/06/2021), or if symptoms worsen or fail to improve.   Terri Piedra, MSN, FNP-C Nurse Practitioner [facility] [place] number: [phone]

## 2021-03-06 NOTE — Patient Instructions (Addendum)
Nice to see you.   Continue to take your [place] daily as prescribed.   Refills have been sent to the pharmacy.   If you are interested in counseling services:  Telehealth Non-Urgent appointment with counselor [phone]  Family Services [phone] / Walk-in Monday thru Friday   Plan for follow up in 6 months or sooner if needed with lab work 1-2 weeks prior to appointment.   Have a great day and stay safe!

## 2021-03-08 ENCOUNTER — Encounter: Payer: Self-pay | Admitting: Physical Therapy

## 2021-03-08 ENCOUNTER — Ambulatory Visit (INDEPENDENT_AMBULATORY_CARE_PROVIDER_SITE_OTHER): Payer: BC Managed Care – PPO | Admitting: Physical Therapy

## 2021-03-08 ENCOUNTER — Other Ambulatory Visit: Payer: Self-pay

## 2021-03-08 DIAGNOSIS — R600 Localized edema: Secondary | ICD-10-CM | POA: Diagnosis not present

## 2021-03-08 DIAGNOSIS — M6281 Muscle weakness (generalized): Secondary | ICD-10-CM | POA: Diagnosis not present

## 2021-03-08 DIAGNOSIS — R262 Difficulty in walking, not elsewhere classified: Secondary | ICD-10-CM

## 2021-03-08 DIAGNOSIS — M25562 Pain in left knee: Secondary | ICD-10-CM

## 2021-03-08 NOTE — Therapy (Signed)
[facility] Physical Therapy [address] [place], [place], [address] Phone: [phone]   Fax:  [phone]  Physical Therapy Treatment/Progress Note Progress Note Reporting Period 02/05/21 to 03/08/21   See note below for Objective Data and Assessment of Progress/Goals.       Patient Details  Name: William Mcfarland MRN: [id number] Date of Birth: 06-May-1956 Referring Provider (PT): Dr. Marlou Sa   Encounter Date: 03/08/2021   PT End of Session - 03/08/21 1058    Visit Number 10    Number of Visits 16    Date for PT Re-Evaluation 04/16/21    Progress Note Due on Visit 20    PT Start Time 1013    PT Stop Time 1105    PT Time Calculation (min) 52 min    Activity Tolerance Patient tolerated treatment well    Behavior During Therapy [facility] for tasks assessed/performed           Past Medical History:  Diagnosis Date  . Arthritis    shoulders, knees, hips  . At risk for sleep apnea    STOP-BANG= 5     SENT TO PCP 04-12-2015  . History of colon polyps   . History of radiation therapy 45 Gy plus seed implants on 04-13-2015   prostate external beam 02-20-2015 to 03-24-2015  . Hyperlipidemia   . Hypertension   . Lower urinary tract symptoms (LUTS)   . Prostate cancer [facility]) urologist-  dr ottelin/  oncologist- dr Tammi Klippel   T1c, Gleason 4+3,  PSA 6.83,  vol. 32.56cc--  s/p  external beam radiation 02-20-2015 to 03-24-2015  . Type 2 diabetes mellitus ([place])   . Type 2 diabetes, diet controlled ([place])   . Wears glasses     Past Surgical History:  Procedure Laterality Date  . COLONOSCOPY W/ POLYPECTOMY  2013  . EYE SURGERY     Cornea replacement left eye (2018)  . JOINT REPLACEMENT    . ORIF LEFT LEG FX  1988  . ORIF RIGHT ANKLE FX  1985  . PROSTATE BIOPSY    . RADIOACTIVE SEED IMPLANT N/A 04/14/2015   Procedure: RADIOACTIVE SEED IMPLANT;  Surgeon: Kathie Rhodes, MD;  [facility];  Service: Urology;  Laterality: N/A;   64      seeds implanted no seeds found in bladder  . TOTAL KNEE ARTHROPLASTY Left 01/16/2021   Procedure: LEFT TOTAL KNEE ARTHROPLASTY;  Surgeon: Meredith Pel, MD;  Location: [place];  Service: Orthopedics;  Laterality: Left;  . TOTAL SHOULDER ARTHROPLASTY Right 05/21/2016   Procedure: TOTAL SHOULDER ARTHROPLASTY;  Surgeon: Meredith Pel, MD;  Location: [place];  Service: Orthopedics;  Laterality: Right;  . TOTAL SHOULDER ARTHROPLASTY Left 10/05/2019   Procedure: LEFT Total shoulder arthroplasty;  Surgeon: Meredith Pel, MD;  Location: [place];  Service: Orthopedics;  Laterality: Left;    There were no vitals filed for this visit.   Subjective Assessment - 03/08/21 1014    Subjective hip is still very uncomfortabe - worse with standing after prolonged sitting    Pertinent History OA, HTN, prostate cancer, DM, HIV+    Limitations Standing;Walking;House hold activities    Patient Stated Goals Reduce pain, walk independenty, return to work    Currently in Pain? Yes    Pain Score 0-No pain   Lt hip 3/10             [facility] PT Assessment - 03/08/21 1038      Assessment   Medical Diagnosis  Lt TKA    Referring Provider (PT) Dr. Marlou Sa    Onset Date/Surgical Date 01/16/21    Next MD Visit 04/11/21      Home Environment   Additional Comments --      Observation/Other Assessments   Focus on Therapeutic Outcomes (FOTO)  56      AROM   Left Knee Flexion 106      PROM   Left Knee Flexion 112   Active Assistive                        [facility] Adult PT Treatment/Exercise - 03/08/21 1016      Knee/Hip Exercises: Stretches   Sports administrator Left;5 reps;20 seconds    Quad Stretch Limitations supine with strap with hip flexor stretch    Knee: Self-Stretch Limitations seated tailgate flexion stretch 10 sec X 10 overpressure with RLE      Knee/Hip Exercises: Aerobic   Recumbent Bike L4 x 8 min      Knee/Hip Exercises: Machines for Strengthening   Total Gym Leg Press LLE 50#  3x10      Knee/Hip Exercises: Seated   Long Arc Quad Left;3 sets;10 reps    Long Arc Quad Weight 5 lbs.      Knee/Hip Exercises: Supine   Heel Slides AAROM;Left;1 set;10 reps      Vasopneumatic   Number Minutes Vasopneumatic  10 minutes    Vasopnuematic Location  Knee    Vasopneumatic Pressure Medium    Vasopneumatic Temperature  34                    PT Short Term Goals - 02/27/21 1013      PT SHORT TERM GOAL #1   Title Patient will demonstrate independent use of home exercise program to maintain progress from in clinic treatments.    Time 3    Period Weeks    Status Achieved    Target Date 02/26/21             PT Long Term Goals - 03/08/21 1059      PT LONG TERM GOAL #1   Title Patient will demonstrate/report pain at worst less than or equal to 2/10 to facilitate minimal limitation in daily activity secondary to pain symptoms.    Time 10    Period Weeks    Status On-going    Target Date 04/16/21      PT LONG TERM GOAL #2   Title Patient will demonstrate independent use of home exercise program to facilitate ability to maintain/progress functional gains from skilled physical therapy services.    Time 10    Period Weeks    Status On-going    Target Date 04/16/21      PT LONG TERM GOAL #3   Title Patient will demonstrate independent ambulation community distances > 300 ft to facilitate community integration at [facility].    Time 10    Period Weeks    Status Achieved      PT LONG TERM GOAL #4   Title Patient will demonstrate Lt knee AROM 0-110 degrees to facilitate ability to perform transfers, sitting, ambulation, stair navigation s restriction due to mobility.    Time 10    Period Weeks    Status On-going    Target Date 04/16/21      PT LONG TERM GOAL #5   Title Patient will demonstrate Lt LE MMT 5/5 throughout to facilitate ability to perform usual  standing, walking, stairs at PLOF s limitation due to symptoms.    Time 10    Period Weeks    Status  On-going    Target Date 04/16/21      PT LONG TERM GOAL #6   Title Pt. will demonstrate FOTO score > 59 to indicated reduced disability.    Time 10    Period Weeks    Status On-going    Target Date 04/16/21                 Plan - 03/08/21 1059    Clinical Impression Statement Pt demonstrating progress towards all LTGs at this time with steady improvements in AROM and FOTO score improved by 12 points to date.  Overall progressing well with PT in regards to Lt knee, and Lt hip with increased pain limiting standing tolerance and activities.  He is to consider following up with MD regarding Lt hip if pain persists.    Personal Factors and Comorbidities Comorbidity 3+    Comorbidities OA, HTN, prostate cancer, DM, HIV+    Examination-Activity Limitations Sit;Sleep;Squat;Bend;Stairs;Stand;Transfers;Locomotion Level    Stability/Clinical Decision Making Stable/Uncomplicated    Rehab Potential Good    PT Frequency 2x / week   2x/week then transitioning to 1-2x/week as appropraite   PT Duration --   10 weeks   PT Treatment/Interventions ADLs/Self Care Home Management;Cryotherapy;Electrical Stimulation;Moist Heat;Balance training;Therapeutic exercise;Iontophoresis 4mg /ml Dexamethasone;Therapeutic activities;Functional mobility training;Stair training;Gait training;DME Instruction;Neuromuscular re-education;Patient/family education;Manual techniques;Vasopneumatic Device;Taping;Dry needling;Passive range of motion;Spinal Manipulations;Joint Manipulations    PT Next Visit Plan manual for mobility gains, strengthening and AROM/PROM    PT Home Exercise Plan [facility]    Consulted and Agree with Plan of Care Patient           Patient will benefit from skilled therapeutic intervention in order to improve the following deficits and impairments:  Pain,Decreased coordination,Impaired flexibility,Impaired perceived functional ability,Improper body mechanics,Difficulty walking,Decreased  mobility,Decreased balance,Decreased range of motion,Decreased strength,Decreased activity tolerance,Increased edema,Hypomobility,Decreased endurance,Abnormal gait  Visit Diagnosis: Left knee pain, unspecified chronicity  Muscle weakness (generalized)  Difficulty in walking, not elsewhere classified  Localized edema     Problem List Patient Active Problem List   Diagnosis Date Noted  . Arthritis of left knee   . S/P total knee arthroplasty, left 01/16/2021  . Gout 07/03/2020  . 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 Healthcare maintenance 06/03/2019  . Human immunodeficiency virus (HIV) disease ([place]) 05/03/2019  . Type 2 diabetes mellitus with hyperglycemia, without long-term current use of insulin ([place]) 05/03/2019  . Essential hypertension 05/03/2019  . Class 1 obesity due to excess calories with serious comorbidity and body mass index (BMI) of 32.0 to 32.9 in adult 05/03/2019  . Degenerative arthritis of shoulder region 05/21/2016  . Malignant neoplasm of prostate ([place]) 11/20/2014  . Travel advice encounter 04/01/2014      Laureen Abrahams, PT, DPT 03/08/21 11:02 AM     [facility] Physical Therapy [address] [place], [place], [address] Phone: [phone]   Fax:  [phone]  Name: William Mcfarland MRN: [id number] Date of Birth: 12/12/1956

## 2021-03-13 ENCOUNTER — Encounter: Payer: Self-pay | Admitting: Physical Therapy

## 2021-03-13 ENCOUNTER — Ambulatory Visit (INDEPENDENT_AMBULATORY_CARE_PROVIDER_SITE_OTHER): Payer: BC Managed Care – PPO | Admitting: Physical Therapy

## 2021-03-13 ENCOUNTER — Other Ambulatory Visit: Payer: Self-pay

## 2021-03-13 DIAGNOSIS — M25562 Pain in left knee: Secondary | ICD-10-CM | POA: Diagnosis not present

## 2021-03-13 DIAGNOSIS — M6281 Muscle weakness (generalized): Secondary | ICD-10-CM | POA: Diagnosis not present

## 2021-03-13 DIAGNOSIS — R600 Localized edema: Secondary | ICD-10-CM

## 2021-03-13 DIAGNOSIS — R262 Difficulty in walking, not elsewhere classified: Secondary | ICD-10-CM

## 2021-03-13 NOTE — Therapy (Signed)
[facility] Physical Therapy [address] [place], [place], [address] Phone: [phone]   Fax:  [phone]  Physical Therapy Treatment  Patient Details  Name: William Mcfarland MRN: [id number] Date of Birth: 04-18-1956 Referring Provider (PT): Dr. Marlou Sa   Encounter Date: 03/13/2021   PT End of Session - 03/13/21 1111    Visit Number 11    Number of Visits 16    Date for PT Re-Evaluation 04/16/21    Progress Note Due on Visit 20    PT Start Time 1014    PT Stop Time 1105    PT Time Calculation (min) 51 min    Activity Tolerance Patient tolerated treatment well    Behavior During Therapy [facility] for tasks assessed/performed           Past Medical History:  Diagnosis Date  . Arthritis    shoulders, knees, hips  . At risk for sleep apnea    STOP-BANG= 5     SENT TO PCP 04-12-2015  . History of colon polyps   . History of radiation therapy 45 Gy plus seed implants on 04-13-2015   prostate external beam 02-20-2015 to 03-24-2015  . Hyperlipidemia   . Hypertension   . Lower urinary tract symptoms (LUTS)   . Prostate cancer [facility]) urologist-  dr ottelin/  oncologist- dr Tammi Klippel   T1c, Gleason 4+3,  PSA 6.83,  vol. 32.56cc--  s/p  external beam radiation 02-20-2015 to 03-24-2015  . Type 2 diabetes mellitus ([place])   . Type 2 diabetes, diet controlled ([place])   . Wears glasses     Past Surgical History:  Procedure Laterality Date  . COLONOSCOPY W/ POLYPECTOMY  2013  . EYE SURGERY     Cornea replacement left eye (2018)  . JOINT REPLACEMENT    . ORIF LEFT LEG FX  1988  . ORIF RIGHT ANKLE FX  1985  . PROSTATE BIOPSY    . RADIOACTIVE SEED IMPLANT N/A 04/14/2015   Procedure: RADIOACTIVE SEED IMPLANT;  Surgeon: Kathie Rhodes, MD;  Location: [facility];  Service: Urology;  Laterality: N/A;   64     seeds implanted no seeds found in bladder  . TOTAL KNEE ARTHROPLASTY Left 01/16/2021   Procedure: LEFT TOTAL KNEE ARTHROPLASTY;  Surgeon: Meredith Pel, MD;  Location: [place];  Service: Orthopedics;  Laterality: Left;  . TOTAL SHOULDER ARTHROPLASTY Right 05/21/2016   Procedure: TOTAL SHOULDER ARTHROPLASTY;  Surgeon: Meredith Pel, MD;  Location: [place];  Service: Orthopedics;  Laterality: Right;  . TOTAL SHOULDER ARTHROPLASTY Left 10/05/2019   Procedure: LEFT Total shoulder arthroplasty;  Surgeon: Meredith Pel, MD;  Location: [place];  Service: Orthopedics;  Laterality: Left;    There were no vitals filed for this visit.   Subjective Assessment - 03/13/21 1015    Subjective knee is doing well, sees Dr. Marlou Sa next week about the hip    Pertinent History OA, HTN, prostate cancer, DM, HIV+    Limitations Standing;Walking;House hold activities    Patient Stated Goals Reduce pain, walk independenty, return to work    Pain Score 0-No pain                             [facility] Adult PT Treatment/Exercise - 03/13/21 1016      Neuro Re-ed    Neuro Re-ed Details  tandem walk forward/backward with intermittent UE support; SLS LLE 5x15 sec  Knee/Hip Exercises: Aerobic   Recumbent Bike L5 x 8 min      Knee/Hip Exercises: Machines for Strengthening   Total Gym Leg Press LLE 50# 3x10      Knee/Hip Exercises: Standing   Forward Step Up Both;2 sets;10 reps;Hand Hold: 1;Step Height: 6"    Other Standing Knee Exercises sidestepping with march and UE support x 4 laps in // bars    Other Standing Knee Exercises RDL x10; 10# KB      Knee/Hip Exercises: Seated   Long Arc Quad Left;3 sets;10 reps    Long Arc Quad Weight 5 lbs.   7# x 2 sets   Sit to Sand 2 sets;10 reps;without UE support      Vasopneumatic   Number Minutes Vasopneumatic  10 minutes    Vasopnuematic Location  Knee    Vasopneumatic Pressure High    Vasopneumatic Temperature  34                    PT Short Term Goals - 02/27/21 1013      PT SHORT TERM GOAL #1   Title Patient will demonstrate independent use of home exercise program to  maintain progress from in clinic treatments.    Time 3    Period Weeks    Status Achieved    Target Date 02/26/21             PT Long Term Goals - 03/08/21 1059      PT LONG TERM GOAL #1   Title Patient will demonstrate/report pain at worst less than or equal to 2/10 to facilitate minimal limitation in daily activity secondary to pain symptoms.    Time 10    Period Weeks    Status On-going    Target Date 04/16/21      PT LONG TERM GOAL #2   Title Patient will demonstrate independent use of home exercise program to facilitate ability to maintain/progress functional gains from skilled physical therapy services.    Time 10    Period Weeks    Status On-going    Target Date 04/16/21      PT LONG TERM GOAL #3   Title Patient will demonstrate independent ambulation community distances > 300 ft to facilitate community integration at [facility].    Time 10    Period Weeks    Status Achieved      PT LONG TERM GOAL #4   Title Patient will demonstrate Lt knee AROM 0-110 degrees to facilitate ability to perform transfers, sitting, ambulation, stair navigation s restriction due to mobility.    Time 10    Period Weeks    Status On-going    Target Date 04/16/21      PT LONG TERM GOAL #5   Title Patient will demonstrate Lt LE MMT 5/5 throughout to facilitate ability to perform usual standing, walking, stairs at PLOF s limitation due to symptoms.    Time 10    Period Weeks    Status On-going    Target Date 04/16/21      PT LONG TERM GOAL #6   Title Pt. will demonstrate FOTO score > 59 to indicated reduced disability.    Time 10    Period Weeks    Status On-going    Target Date 04/16/21                 Plan - 03/13/21 1111    Clinical Impression Statement Pt tolerated session well today with continued  focus on strengthening and maximizing ROM.  Initiated some balance work today with him needing intermittent UE support for activities due to decreased balance.  Will continue to  benefit from PT to maximize function.    Personal Factors and Comorbidities Comorbidity 3+    Comorbidities OA, HTN, prostate cancer, DM, HIV+    Examination-Activity Limitations Sit;Sleep;Squat;Bend;Stairs;Stand;Transfers;Locomotion Level    Stability/Clinical Decision Making Stable/Uncomplicated    Rehab Potential Good    PT Frequency 2x / week   2x/week then transitioning to 1-2x/week as appropraite   PT Duration --   10 weeks   PT Treatment/Interventions ADLs/Self Care Home Management;Cryotherapy;Electrical Stimulation;Moist Heat;Balance training;Therapeutic exercise;Iontophoresis 4mg /ml Dexamethasone;Therapeutic activities;Functional mobility training;Stair training;Gait training;DME Instruction;Neuromuscular re-education;Patient/family education;Manual techniques;Vasopneumatic Device;Taping;Dry needling;Passive range of motion;Spinal Manipulations;Joint Manipulations    PT Next Visit Plan manual for mobility gains, strengthening and AROM/PROM, balance work    PT [place] and Agree with Plan of Care Patient           Patient will benefit from skilled therapeutic intervention in order to improve the following deficits and impairments:  Pain,Decreased coordination,Impaired flexibility,Impaired perceived functional ability,Improper body mechanics,Difficulty walking,Decreased mobility,Decreased balance,Decreased range of motion,Decreased strength,Decreased activity tolerance,Increased edema,Hypomobility,Decreased endurance,Abnormal gait  Visit Diagnosis: Left knee pain, unspecified chronicity  Muscle weakness (generalized)  Difficulty in walking, not elsewhere classified  Localized edema     Problem List Patient Active Problem List   Diagnosis Date Noted  . Arthritis of left knee   . S/P total knee arthroplasty, left 01/16/2021  . Gout 07/03/2020  . 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 Healthcare maintenance 06/03/2019  . Human  immunodeficiency virus (HIV) disease ([place]) 05/03/2019  . Type 2 diabetes mellitus with hyperglycemia, without long-term current use of insulin ([place]) 05/03/2019  . Essential hypertension 05/03/2019  . Class 1 obesity due to excess calories with serious comorbidity and body mass index (BMI) of 32.0 to 32.9 in adult 05/03/2019  . Degenerative arthritis of shoulder region 05/21/2016  . Malignant neoplasm of prostate ([place]) 11/20/2014  . Travel advice encounter 04/01/2014      Laureen Abrahams, PT, DPT 03/13/21 11:13 AM     [facility] Physical Therapy [address] [place], [place], [address] Phone: [phone]   Fax:  [phone]  Name: STEADMAN PROSPERI MRN: [id number] Date of Birth: 08-Apr-1956

## 2021-03-15 ENCOUNTER — Encounter: Payer: BC Managed Care – PPO | Admitting: Physical Therapy

## 2021-03-19 ENCOUNTER — Ambulatory Visit: Payer: BC Managed Care – PPO | Admitting: Orthopedic Surgery

## 2021-03-20 ENCOUNTER — Ambulatory Visit (INDEPENDENT_AMBULATORY_CARE_PROVIDER_SITE_OTHER): Payer: BC Managed Care – PPO | Admitting: Physical Therapy

## 2021-03-20 ENCOUNTER — Other Ambulatory Visit: Payer: Self-pay

## 2021-03-20 ENCOUNTER — Telehealth: Payer: Self-pay | Admitting: Orthopedic Surgery

## 2021-03-20 ENCOUNTER — Encounter: Payer: Self-pay | Admitting: Physical Therapy

## 2021-03-20 DIAGNOSIS — M6281 Muscle weakness (generalized): Secondary | ICD-10-CM

## 2021-03-20 DIAGNOSIS — M25562 Pain in left knee: Secondary | ICD-10-CM | POA: Diagnosis not present

## 2021-03-20 DIAGNOSIS — R600 Localized edema: Secondary | ICD-10-CM

## 2021-03-20 DIAGNOSIS — R262 Difficulty in walking, not elsewhere classified: Secondary | ICD-10-CM | POA: Diagnosis not present

## 2021-03-20 NOTE — Telephone Encounter (Signed)
His arthritis in the left hip is worsening more than likely.  If he wants to get an injection we will set him up with an injection with [facility] or [place].  If he wants to talk to me about surgery was do that in the morning sometime when xu or  Ninfa Linden are also in clinic thanks

## 2021-03-20 NOTE — Telephone Encounter (Signed)
See below. Are you ok with working patient in? He was scheduled to come in yesterday but cancelled it because he stated he had to go out of town.  He is having left hip/groin pain.  Periodic radicular leg pain, denies N/T, some back pain. We did see him about a year ago for exact same thing. Please advise.

## 2021-03-20 NOTE — Telephone Encounter (Signed)
Colletta [place] asked me to see if you could get this pt worked in this week because his hip is affecting his PT. I put him down for the 18th but she would like for you to make it sooner! [phone]

## 2021-03-20 NOTE — Therapy (Addendum)
[facility] Physical Therapy [address] [place], [place], [address] Phone: [phone]   Fax:  [phone]  Physical Therapy Treatment/Discharge  Patient Details  Name: William Mcfarland MRN: [id number] Date of Birth: Apr 11, 1956 Referring Provider (PT): Dr. Marlou Sa   Encounter Date: 03/20/2021   PT End of Session - 03/20/21 1054    Visit Number 12    Number of Visits 16    Date for PT Re-Evaluation 04/16/21    Progress Note Due on Visit 20    PT Start Time 1015    PT Stop Time 1053    PT Time Calculation (min) 38 min    Activity Tolerance Patient tolerated treatment well    Behavior During Therapy [facility] for tasks assessed/performed           Past Medical History:  Diagnosis Date  . Arthritis    shoulders, knees, hips  . At risk for sleep apnea    STOP-BANG= 5     SENT TO PCP 04-12-2015  . History of colon polyps   . History of radiation therapy 45 Gy plus seed implants on 04-13-2015   prostate external beam 02-20-2015 to 03-24-2015  . Hyperlipidemia   . Hypertension   . Lower urinary tract symptoms (LUTS)   . Prostate cancer [facility]) urologist-  dr ottelin/  oncologist- dr Tammi Klippel   T1c, Gleason 4+3,  PSA 6.83,  vol. 32.56cc--  s/p  external beam radiation 02-20-2015 to 03-24-2015  . Type 2 diabetes mellitus ([place])   . Type 2 diabetes, diet controlled ([place])   . Wears glasses     Past Surgical History:  Procedure Laterality Date  . COLONOSCOPY W/ POLYPECTOMY  2013  . EYE SURGERY     Cornea replacement left eye (2018)  . JOINT REPLACEMENT    . ORIF LEFT LEG FX  1988  . ORIF RIGHT ANKLE FX  1985  . PROSTATE BIOPSY    . RADIOACTIVE SEED IMPLANT N/A 04/14/2015   Procedure: RADIOACTIVE SEED IMPLANT;  Surgeon: Kathie Rhodes, MD;  Location: [facility];  Service: Urology;  Laterality: N/A;   64     seeds implanted no seeds found in bladder  . TOTAL KNEE ARTHROPLASTY Left 01/16/2021   Procedure: LEFT TOTAL KNEE ARTHROPLASTY;  Surgeon:  Meredith Pel, MD;  Location: [place];  Service: Orthopedics;  Laterality: Left;  . TOTAL SHOULDER ARTHROPLASTY Right 05/21/2016   Procedure: TOTAL SHOULDER ARTHROPLASTY;  Surgeon: Meredith Pel, MD;  Location: [place];  Service: Orthopedics;  Laterality: Right;  . TOTAL SHOULDER ARTHROPLASTY Left 10/05/2019   Procedure: LEFT Total shoulder arthroplasty;  Surgeon: Meredith Pel, MD;  Location: [place];  Service: Orthopedics;  Laterality: Left;    There were no vitals filed for this visit.   Subjective Assessment - 03/20/21 1016    Subjective "my hip is killing me." had to cx appt yesterday with Dr. Marlou Sa due to going out of town and hasn't rescheduled.  His hip is worse after sitting in car for long periods driving to [place].    Pertinent History OA, HTN, prostate cancer, DM, HIV+    Limitations Standing;Walking;House hold activities    Patient Stated Goals Reduce pain, walk independenty, return to work    Currently in Pain? Yes    Pain Score 0-No pain    Pain Location Knee    Multiple Pain Sites Yes    Pain Score 5    Pain Location Hip    Pain Orientation Left  Pain Descriptors / Indicators Sharp;Shooting    Pain Type Acute pain    Pain Onset More than a month ago    Pain Frequency Intermittent    Aggravating Factors  prolonged sitting                             [facility] Adult PT Treatment/Exercise - 03/20/21 1020      Knee/Hip Exercises: Stretches   Passive Hamstring Stretch Left;30 seconds;4 reps    Passive Hamstring Stretch Limitations supine with strap    Quad Stretch Left;4 reps;30 seconds    Quad Stretch Limitations supine with strap with hip flexor stretch      Knee/Hip Exercises: Machines for Strengthening   Cybex Knee Extension 10# 3x10; LLE only    Cybex Knee Flexion LLE 25# 2x10    Other Machine standing hip 3-way (no adduction) with cable 5# x 20 reps each, Lt only (attempted Rt - unable due to LLE pain)                     PT Short Term Goals - 02/27/21 1013      PT SHORT TERM GOAL #1   Title Patient will demonstrate independent use of home exercise program to maintain progress from in clinic treatments.    Time 3    Period Weeks    Status Achieved    Target Date 02/26/21             PT Long Term Goals - 03/08/21 1059      PT LONG TERM GOAL #1   Title Patient will demonstrate/report pain at worst less than or equal to 2/10 to facilitate minimal limitation in daily activity secondary to pain symptoms.    Time 10    Period Weeks    Status On-going    Target Date 04/16/21      PT LONG TERM GOAL #2   Title Patient will demonstrate independent use of home exercise program to facilitate ability to maintain/progress functional gains from skilled physical therapy services.    Time 10    Period Weeks    Status On-going    Target Date 04/16/21      PT LONG TERM GOAL #3   Title Patient will demonstrate independent ambulation community distances > 300 ft to facilitate community integration at [facility].    Time 10    Period Weeks    Status Achieved      PT LONG TERM GOAL #4   Title Patient will demonstrate Lt knee AROM 0-110 degrees to facilitate ability to perform transfers, sitting, ambulation, stair navigation s restriction due to mobility.    Time 10    Period Weeks    Status On-going    Target Date 04/16/21      PT LONG TERM GOAL #5   Title Patient will demonstrate Lt LE MMT 5/5 throughout to facilitate ability to perform usual standing, walking, stairs at PLOF s limitation due to symptoms.    Time 10    Period Weeks    Status On-going    Target Date 04/16/21      PT LONG TERM GOAL #6   Title Pt. will demonstrate FOTO score > 59 to indicated reduced disability.    Time 10    Period Weeks    Status On-going    Target Date 04/16/21  Plan - 03/20/21 1054    Clinical Impression Statement [name] is continuing to impact rehab progress and exercises at this time, and  had to cx appt yesterday with Dr. Marlou Sa due to needing to travel out of town last minute.  Recommend follow up with Dr. Marlou Sa to assess Lt hip.  Despite pain Lt knee continues to do well with exercises that he can tolerate.    Personal Factors and Comorbidities Comorbidity 3+    Comorbidities OA, HTN, prostate cancer, DM, HIV+    Examination-Activity Limitations Sit;Sleep;Squat;Bend;Stairs;Stand;Transfers;Locomotion Level    Stability/Clinical Decision Making Stable/Uncomplicated    Rehab Potential Good    PT Frequency 2x / week   2x/week then transitioning to 1-2x/week as appropraite   PT Duration --   10 weeks   PT Treatment/Interventions ADLs/Self Care Home Management;Cryotherapy;Electrical Stimulation;Moist Heat;Balance training;Therapeutic exercise;Iontophoresis 38m/ml Dexamethasone;Therapeutic activities;Functional mobility training;Stair training;Gait training;DME Instruction;Neuromuscular re-education;Patient/family education;Manual techniques;Vasopneumatic Device;Taping;Dry needling;Passive range of motion;Spinal Manipulations;Joint Manipulations    PT Next Visit Plan manual for mobility gains, strengthening and AROM/PROM, balance work    PT [place] Agree with Plan of Care Patient           Patient will benefit from skilled therapeutic intervention in order to improve the following deficits and impairments:  Pain,Decreased coordination,Impaired flexibility,Impaired perceived functional ability,Improper body mechanics,Difficulty walking,Decreased mobility,Decreased balance,Decreased range of motion,Decreased strength,Decreased activity tolerance,Increased edema,Hypomobility,Decreased endurance,Abnormal gait  Visit Diagnosis: Left knee pain, unspecified chronicity  Muscle weakness (generalized)  Difficulty in walking, not elsewhere classified  Localized edema     Problem List Patient Active Problem List   Diagnosis Date Noted  . Arthritis of  left knee   . S/P total knee arthroplasty, left 01/16/2021  . Gout 07/03/2020  . 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 Healthcare maintenance 06/03/2019  . Human immunodeficiency virus (HIV) disease ([place] 05/03/2019  . Type 2 diabetes mellitus with hyperglycemia, without long-term current use of insulin ([place] 05/03/2019  . Essential hypertension 05/03/2019  . Class 1 obesity due to excess calories with serious comorbidity and body mass index (BMI) of 32.0 to 32.9 in adult 05/03/2019  . Degenerative arthritis of shoulder region 05/21/2016  . Malignant neoplasm of prostate ([place] 11/20/2014  . Travel advice encounter 04/01/2014      SLaureen Abrahams PT, DPT 03/20/21 10:56 AM   PHYSICAL THERAPY DISCHARGE SUMMARY  Visits from Start of Care: 12  Current functional level related to goals / functional outcomes: See note   Remaining deficits: See note   Education / Equipment: HEP Plan: Patient agrees to discharge.  Patient goals were partially met. Patient is being discharged due to not returning since the last visit.  ?????     MScot Jun PT, DPT, OCS, ATC 04/13/21  9:04 AM       [facility] [address] [address]Phone: [phone]  Fax:  [phone] Name: William CHASENMRN: [id number] of Birth: 103-27-1957

## 2021-03-20 NOTE — Telephone Encounter (Signed)
Will call patient Wednesday

## 2021-03-21 NOTE — Telephone Encounter (Signed)
IC LMVM for patient to call me back to discuss below.

## 2021-03-22 ENCOUNTER — Encounter: Payer: BC Managed Care – PPO | Admitting: Physical Therapy

## 2021-03-27 ENCOUNTER — Encounter: Payer: BC Managed Care – PPO | Admitting: Physical Therapy

## 2021-03-29 ENCOUNTER — Encounter: Payer: BC Managed Care – PPO | Admitting: Physical Therapy

## 2021-04-02 ENCOUNTER — Ambulatory Visit: Payer: Self-pay

## 2021-04-02 ENCOUNTER — Ambulatory Visit (INDEPENDENT_AMBULATORY_CARE_PROVIDER_SITE_OTHER): Payer: BC Managed Care – PPO

## 2021-04-02 ENCOUNTER — Ambulatory Visit (INDEPENDENT_AMBULATORY_CARE_PROVIDER_SITE_OTHER): Payer: BC Managed Care – PPO | Admitting: Orthopedic Surgery

## 2021-04-02 DIAGNOSIS — M25552 Pain in left hip: Secondary | ICD-10-CM

## 2021-04-02 DIAGNOSIS — M25562 Pain in left knee: Secondary | ICD-10-CM

## 2021-04-02 DIAGNOSIS — M1612 Unilateral primary osteoarthritis, left hip: Secondary | ICD-10-CM

## 2021-04-02 MED ORDER — TRAMADOL HCL 50 MG PO TABS: 50.0000 mg | ORAL_TABLET | Freq: Every evening | ORAL | 0 refills | Status: DC | PRN

## 2021-04-03 ENCOUNTER — Encounter: Payer: BC Managed Care – PPO | Admitting: Physical Therapy

## 2021-04-04 ENCOUNTER — Encounter: Payer: Self-pay | Admitting: Orthopedic Surgery

## 2021-04-04 ENCOUNTER — Telehealth: Payer: Self-pay | Admitting: Orthopaedic Surgery

## 2021-04-04 NOTE — Progress Notes (Signed)
Post-Op Visit Note   Patient: William Mcfarland           Date of Birth: January 09, 1956           MRN: [id number] Visit Date: 04/02/2021 PCP: Kathyrn Lass, MD   Assessment & Plan:  Chief Complaint:  Chief Complaint  Patient presents with  . Left Knee - Pain  . Left Hip - Pain   Visit Diagnoses:  1. Unilateral primary osteoarthritis, left hip   2. Left knee pain, unspecified chronicity   3. Pain in left hip     Plan: Patient is a 65 year old male who presents s/p left total knee arthroplasty on 01/16/2021.  He is here for reevaluation of his left knee as well as new complaint of hip pain.  His knee is doing well overall.  He does have occasional pain in the knee that travels down the anterior shin but this is only on occasion and seems to be improving.  Denies any instability events, effusion, fevers, chills, night sweats, malaise.  No change in the appearance of the incision and the incision is healing well.  He has excellent extension to 0 degrees and flexion to about 105 degrees.  He has gone to about 108 degrees in therapy.  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  Excellent quadricep strength and can perform multiple straight leg raises.  No calf tenderness.  Negative Homans' sign.  Radiographs of the left knee taken today show left knee prosthesis in excellent position with no complicating features.  He has expressed concern about the proximity of the tibial implant to the tibial nail but there is no change in position of the implant since prior radiographs.  Overall he is very pleased with how his knee has turned out and has no complaints really.  He does complain of significantly worsening left hip pain that he localizes primarily to the groin with some occasional lateral hip pain.  This has been worsening over the last several months.  On exam he has increased pain with passive hip flexion and internal rotation.  Positive Stinchfield exam of the left hip.  Very  minimal tenderness over the trochanteric bursa.  Negative straight leg raise.  Decreased passive internal rotation of the left hip compared to the contralateral hip.  He walks with a slight Trendelenburg gait.  Radiographs were taken today of the left hip which revealed severe end-stage osteoarthritis of the left hip that is significantly progressed since prior radiographs 1 year ago.  Discussed options available patient including living with his symptoms, intra-articular hip injection, hip replacement.  I do think that hip injection would likely provide minimal, fleeting relief.  Given this, patient would like to proceed with left total hip arthroplasty.  Plan to refer patient to Dr. Erlinda Hong for hip replacement.  Patient agreed with this plan.  Follow-Up Instructions: No follow-ups on file.   Orders:  Orders Placed This Encounter  Procedures  . XR Knee 1-2 Views Left  . XR HIP UNILAT W OR W/O PELVIS 2-3 VIEWS LEFT   Meds ordered this encounter  Medications  . traMADol (ULTRAM) 50 MG tablet    Sig: Take 1 tablet (50 mg total) by mouth at bedtime as needed.    Dispense:  20 tablet    Refill:  0    Imaging: No results found.  PMFS History: Patient Active Problem List   Diagnosis Date Noted  . Arthritis of left knee   . S/P total  knee arthroplasty, left 01/16/2021  . Gout 07/03/2020  . 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 Healthcare maintenance 06/03/2019  . Human immunodeficiency virus (HIV) disease ([place]) 05/03/2019  . Type 2 diabetes mellitus with hyperglycemia, without long-term current use of insulin ([place]) 05/03/2019  . Essential hypertension 05/03/2019  . Class 1 obesity due to excess calories with serious comorbidity and body mass index (BMI) of 32.0 to 32.9 in adult 05/03/2019  . Degenerative arthritis of shoulder region 05/21/2016  . Malignant neoplasm of prostate ([place]) 11/20/2014  . Travel advice encounter 04/01/2014   Past Medical History:  Diagnosis Date  .  Arthritis    shoulders, knees, hips  . At risk for sleep apnea    STOP-BANG= 5     SENT TO PCP 04-12-2015  . History of colon polyps   . History of radiation therapy 45 Gy plus seed implants on 04-13-2015   prostate external beam 02-20-2015 to 03-24-2015  . Hyperlipidemia   . Hypertension   . Lower urinary tract symptoms (LUTS)   . Prostate cancer [facility]) urologist-  dr ottelin/  oncologist- dr Tammi Klippel   T1c, Gleason 4+3,  PSA 6.83,  vol. 32.56cc--  s/p  external beam radiation 02-20-2015 to 03-24-2015  . Type 2 diabetes mellitus ([place])   . Type 2 diabetes, diet controlled ([place])   . Wears glasses     Family History  Problem Relation Age of Onset  . Stroke Father   . Stroke Mother   . Cancer Neg Hx     Past Surgical History:  Procedure Laterality Date  . COLONOSCOPY W/ POLYPECTOMY  2013  . EYE SURGERY     Cornea replacement left eye (2018)  . JOINT REPLACEMENT    . ORIF LEFT LEG FX  1988  . ORIF RIGHT ANKLE FX  1985  . PROSTATE BIOPSY    . RADIOACTIVE SEED IMPLANT N/A 04/14/2015   Procedure: RADIOACTIVE SEED IMPLANT;  Surgeon: Kathie Rhodes, MD;  Location: [facility];  Service: Urology;  Laterality: N/A;   64     seeds implanted no seeds found in bladder  . TOTAL KNEE ARTHROPLASTY Left 01/16/2021   Procedure: LEFT TOTAL KNEE ARTHROPLASTY;  Surgeon: Meredith Pel, MD;  Location: [place];  Service: Orthopedics;  Laterality: Left;  . TOTAL SHOULDER ARTHROPLASTY Right 05/21/2016   Procedure: TOTAL SHOULDER ARTHROPLASTY;  Surgeon: Meredith Pel, MD;  Location: [place];  Service: Orthopedics;  Laterality: Right;  . TOTAL SHOULDER ARTHROPLASTY Left 10/05/2019   Procedure: LEFT Total shoulder arthroplasty;  Surgeon: Meredith Pel, MD;  Location: [place];  Service: Orthopedics;  Laterality: Left;   Social History   Occupational History  . Occupation: Air traffic controller  Tobacco Use  . Smoking status: Former Smoker    Packs/day: 0.50    Years: 15.00    Pack  years: 7.50    Types: Cigarettes    Quit date: 04/12/1999    Years since quitting: 21.9  . Smokeless tobacco: Never Used  Vaping Use  . Vaping Use: Never used  Substance and Sexual Activity  . Alcohol use: Yes    Comment: occasional  . Drug use: No  . Sexual activity: Not on file

## 2021-04-04 NOTE — Telephone Encounter (Signed)
Appt scheduled

## 2021-04-04 NOTE — Telephone Encounter (Signed)
Patient called requesting a call back from Autumn F. Or Lauren F. Patient states he seen Dr. Marlou Sa and Dr. Marlou Sa stated to him that he would refer him to see Dr. Erlinda Hong about hip replacement surgery. Patient states he knows it's only been a few days since his appt with Dr. Marlou Sa but he is in a lot of pain. Please call patient at [phone]. Patient states if he is unable to answer please leave a message.

## 2021-04-05 ENCOUNTER — Ambulatory Visit (INDEPENDENT_AMBULATORY_CARE_PROVIDER_SITE_OTHER): Payer: BC Managed Care – PPO | Admitting: Orthopaedic Surgery

## 2021-04-05 ENCOUNTER — Encounter: Payer: BC Managed Care – PPO | Admitting: Physical Therapy

## 2021-04-05 ENCOUNTER — Encounter: Payer: Self-pay | Admitting: Orthopaedic Surgery

## 2021-04-05 VITALS — Ht 66.0 in | Wt 212.0 lb

## 2021-04-05 DIAGNOSIS — E1165 Type 2 diabetes mellitus with hyperglycemia: Secondary | ICD-10-CM | POA: Diagnosis not present

## 2021-04-05 DIAGNOSIS — Z96651 Presence of right artificial knee joint: Secondary | ICD-10-CM

## 2021-04-05 DIAGNOSIS — M87052 Idiopathic aseptic necrosis of left femur: Secondary | ICD-10-CM

## 2021-04-05 DIAGNOSIS — B20 Human immunodeficiency virus [HIV] disease: Secondary | ICD-10-CM | POA: Diagnosis not present

## 2021-04-05 NOTE — Progress Notes (Signed)
Office Visit Note   Patient: William Mcfarland           Date of Birth: 1956/01/25           MRN: [id number] Visit Date: 04/05/2021              Requested by: William Mcfarland, [place],  [place] [id number] PCP: William Lass, MD   Assessment & Plan: Visit Diagnoses:  1. Avascular necrosis of bone of left hip ([place])   2. Human immunodeficiency virus (HIV) disease ([place])   3. Type 2 diabetes mellitus with hyperglycemia, without long-term current use of insulin (H[facility])     Plan: William Mcfarland has done very well from his knee replacement however his progress is severely hampered by the left hip disease.  Given the severity of his avascular necrosis and secondary degenerative joint disease I do not feel that conservative measures such as medications or injections or physical therapy will provide him with any long-term relief or benefit.  Therefore I have recommended a total hip replacement.  Risk benefits rehab recovery are reviewed with the patient in detail.  All questions answered.  I do not believe that he is ready to return back to work in early May due to his ongoing recovery from his left knee replacement and severe hip disease.  Follow-Up Instructions: Return if symptoms worsen or fail to improve.   Orders:  No orders of the defined types were placed in this encounter.  No orders of the defined types were placed in this encounter.     Procedures: No procedures performed   Clinical Data: No additional findings.   Subjective: Chief Complaint  Patient presents with  . Left Hip - Pain    William Mcfarland is a very pleasant 65 year old gentleman referral from William Mcfarland for evaluation of painful left hip due to avascular necrosis.  Patient has well-controlled diabetes and HIV.  He is about 11 weeks status post left total knee replacement by William Mcfarland that did very well.  Unfortunately his progress in physical therapy has been significantly limited by his severe chronic  left hip and groin pain.  He walks with an antalgic gait due to leg length discrepancy.   Review of Systems  Constitutional: Negative.   All other systems reviewed and are negative.    Objective: Vital Signs: Ht 5\' 6"  (1.676 m)   Wt 212 lb (96.2 kg)   BMI 34.22 kg/m   Physical Exam Vitals and nursing note reviewed.  Constitutional:      Appearance: He is well-developed.  Pulmonary:     Effort: Pulmonary effort is normal.  Abdominal:     Palpations: Abdomen is soft.  Skin:    General: Skin is warm.  Neurological:     Mental Status: He is alert and oriented to person, place, and time.  Psychiatric:        Behavior: Behavior normal.        Thought Content: Thought content normal.        Judgment: Judgment normal.     Ortho Exam Left hip exam shows catching logroll.  Positive FADIR.  Lateral hip is mildly to palpation.  Decreased hip flexion strength secondary to pain in the groin and hip.  Significant limitation in range of motion as well. Specialty Comments:  No specialty comments available.  Imaging: No results found.   PMFS History: Patient Active Problem List   Diagnosis Date Noted  . Arthritis of left knee   .  S/P total knee arthroplasty, left 01/16/2021  . Gout 07/03/2020  . 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 Healthcare maintenance 06/03/2019  . Human immunodeficiency virus (HIV) disease ([place]) 05/03/2019  . Type 2 diabetes mellitus with hyperglycemia, without long-term current use of insulin ([place]) 05/03/2019  . Essential hypertension 05/03/2019  . Class 1 obesity due to excess calories with serious comorbidity and body mass index (BMI) of 32.0 to 32.9 in adult 05/03/2019  . Degenerative arthritis of shoulder region 05/21/2016  . Malignant neoplasm of prostate ([place]) 11/20/2014  . Travel advice encounter 04/01/2014   Past Medical History:  Diagnosis Date  . Arthritis    shoulders, knees, hips  . At risk for sleep apnea    STOP-BANG= 5      SENT TO PCP 04-12-2015  . History of colon polyps   . History of radiation therapy 45 Gy plus seed implants on 04-13-2015   prostate external beam 02-20-2015 to 03-24-2015  . Hyperlipidemia   . Hypertension   . Lower urinary tract symptoms (LUTS)   . Prostate cancer [facility]) urologist-  dr William Mcfarland/  oncologist- dr William Mcfarland   T1c, Gleason 4+3,  PSA 6.83,  vol. 32.56cc--  s/p  external beam radiation 02-20-2015 to 03-24-2015  . Type 2 diabetes mellitus ([place])   . Type 2 diabetes, diet controlled ([place])   . Wears glasses     Family History  Problem Relation Age of Onset  . Stroke Father   . Stroke Mother   . Cancer Neg Hx     Past Surgical History:  Procedure Laterality Date  . COLONOSCOPY W/ POLYPECTOMY  2013  . EYE SURGERY     Cornea replacement left eye (2018)  . JOINT REPLACEMENT    . ORIF LEFT LEG FX  1988  . ORIF RIGHT ANKLE FX  1985  . PROSTATE BIOPSY    . RADIOACTIVE SEED IMPLANT N/A 04/14/2015   Procedure: RADIOACTIVE SEED IMPLANT;  Surgeon: William Rhodes, MD;  [facility];  Service: Urology;  Laterality: N/A;   64     seeds implanted no seeds found in bladder  . TOTAL KNEE ARTHROPLASTY Left 01/16/2021   Procedure: LEFT TOTAL KNEE ARTHROPLASTY;  Surgeon: William Pel, MD;  Location: [place];  Service: Orthopedics;  Laterality: Left;  . TOTAL SHOULDER ARTHROPLASTY Right 05/21/2016   Procedure: TOTAL SHOULDER ARTHROPLASTY;  Surgeon: William Pel, MD;  Location: [place];  Service: Orthopedics;  Laterality: Right;  . TOTAL SHOULDER ARTHROPLASTY Left 10/05/2019   Procedure: LEFT Total shoulder arthroplasty;  Surgeon: William Pel, MD;  Location: [place];  Service: Orthopedics;  Laterality: Left;   Social History   Occupational History  . Occupation: Air traffic controller  Tobacco Use  . Smoking status: Former Smoker    Packs/day: 0.50    Years: 15.00    Pack years: 7.50    Types: Cigarettes    Quit date: 04/12/1999    Years since quitting:  21.9  . Smokeless tobacco: Never Used  Vaping Use  . Vaping Use: Never used  Substance and Sexual Activity  . Alcohol use: Yes    Comment: occasional  . Drug use: No  . Sexual activity: Not on file

## 2021-04-07 ENCOUNTER — Encounter: Payer: Self-pay | Admitting: Orthopedic Surgery

## 2021-04-11 ENCOUNTER — Encounter: Payer: Self-pay | Admitting: Orthopedic Surgery

## 2021-04-11 ENCOUNTER — Ambulatory Visit (INDEPENDENT_AMBULATORY_CARE_PROVIDER_SITE_OTHER): Payer: BC Managed Care – PPO | Admitting: Orthopedic Surgery

## 2021-04-11 DIAGNOSIS — Z96652 Presence of left artificial knee joint: Secondary | ICD-10-CM

## 2021-04-11 NOTE — Progress Notes (Signed)
Post-Op Visit Note   Patient: William Mcfarland           Date of Birth: 01-07-1956           MRN: [id number] Visit Date: 04/11/2021 PCP: Kathyrn Lass, MD   Assessment & Plan:  Chief Complaint:  Chief Complaint  Patient presents with  . Left Knee - Routine Post Op    01/16/21 Left TKA   Visit Diagnoses:  1. Status post total left knee replacement     Plan: Patient William Mcfarland is a patient who underwent left total knee replacement 01/16/2021.  Doing well.  Scheduled for hip replacement on 04/30/2021.  On exam he has range of motion 0-100.  No effusion.  Incision looks good.  Groin pain with internal ex rotation of the leg.  plan at this time his hip replacement.  I will follow-up with him as needed For the left total knee.   Follow-Up Instructions: Return if symptoms worsen or fail to improve.   Orders:  No orders of the defined types were placed in this encounter.  No orders of the defined types were placed in this encounter.   Imaging: No results found.  PMFS History: Patient Active Problem List   Diagnosis Date Noted  . Arthritis of left knee   . S/P total knee arthroplasty, left 01/16/2021  . Gout 07/03/2020  . 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 Healthcare maintenance 06/03/2019  . Human immunodeficiency virus (HIV) disease ([place]) 05/03/2019  . Type 2 diabetes mellitus with hyperglycemia, without long-term current use of insulin ([place]) 05/03/2019  . Essential hypertension 05/03/2019  . Class 1 obesity due to excess calories with serious comorbidity and body mass index (BMI) of 32.0 to 32.9 in adult 05/03/2019  . Degenerative arthritis of shoulder region 05/21/2016  . Malignant neoplasm of prostate ([place]) 11/20/2014  . Travel advice encounter 04/01/2014   Past Medical History:  Diagnosis Date  . Arthritis    shoulders, knees, hips  . At risk for sleep apnea    STOP-BANG= 5     SENT TO PCP 04-12-2015  . History of colon polyps   . History of radiation  therapy 45 Gy plus seed implants on 04-13-2015   prostate external beam 02-20-2015 to 03-24-2015  . Hyperlipidemia   . Hypertension   . Lower urinary tract symptoms (LUTS)   . Prostate cancer [facility]) urologist-  dr ottelin/  oncologist- dr Tammi Klippel   T1c, Gleason 4+3,  PSA 6.83,  vol. 32.56cc--  s/p  external beam radiation 02-20-2015 to 03-24-2015  . Type 2 diabetes mellitus ([place])   . Type 2 diabetes, diet controlled ([place])   . Wears glasses     Family History  Problem Relation Age of Onset  . Stroke Father   . Stroke Mother   . Cancer Neg Hx     Past Surgical History:  Procedure Laterality Date  . COLONOSCOPY W/ POLYPECTOMY  2013  . EYE SURGERY     Cornea replacement left eye (2018)  . JOINT REPLACEMENT    . ORIF LEFT LEG FX  1988  . ORIF RIGHT ANKLE FX  1985  . PROSTATE BIOPSY    . RADIOACTIVE SEED IMPLANT N/A 04/14/2015   Procedure: RADIOACTIVE SEED IMPLANT;  Surgeon: Kathie Rhodes, MD;  Location: [facility];  Service: Urology;  Laterality: N/A;   64     seeds implanted no seeds found in bladder  . TOTAL KNEE ARTHROPLASTY Left 01/16/2021   Procedure: LEFT TOTAL  KNEE ARTHROPLASTY;  Surgeon: Meredith Pel, MD;  Location: [place];  Service: Orthopedics;  Laterality: Left;  . TOTAL SHOULDER ARTHROPLASTY Right 05/21/2016   Procedure: TOTAL SHOULDER ARTHROPLASTY;  Surgeon: Meredith Pel, MD;  Location: [place];  Service: Orthopedics;  Laterality: Right;  . TOTAL SHOULDER ARTHROPLASTY Left 10/05/2019   Procedure: LEFT Total shoulder arthroplasty;  Surgeon: Meredith Pel, MD;  Location: [place];  Service: Orthopedics;  Laterality: Left;   Social History   Occupational History  . Occupation: Air traffic controller  Tobacco Use  . Smoking status: Former Smoker    Packs/day: 0.50    Years: 15.00    Pack years: 7.50    Types: Cigarettes    Quit date: 04/12/1999    Years since quitting: 22.0  . Smokeless tobacco: Never Used  Vaping Use  . Vaping Use: Never  used  Substance and Sexual Activity  . Alcohol use: Yes    Comment: occasional  . Drug use: No  . Sexual activity: Not on file

## 2021-04-24 ENCOUNTER — Other Ambulatory Visit: Payer: Self-pay | Admitting: Physician Assistant

## 2021-04-24 MED ORDER — ASPIRIN EC 81 MG PO TBEC: 81.0000 mg | DELAYED_RELEASE_TABLET | Freq: Two times a day (BID) | ORAL | 0 refills | Status: DC

## 2021-04-24 MED ORDER — ONDANSETRON HCL 4 MG PO TABS: 4.0000 mg | ORAL_TABLET | Freq: Three times a day (TID) | ORAL | 0 refills | Status: DC | PRN

## 2021-04-24 MED ORDER — DOCUSATE SODIUM 100 MG PO CAPS: 100.0000 mg | ORAL_CAPSULE | Freq: Every day | ORAL | 2 refills | Status: AC | PRN

## 2021-04-24 MED ORDER — OXYCODONE-ACETAMINOPHEN 5-325 MG PO TABS: 1.0000 | ORAL_TABLET | Freq: Four times a day (QID) | ORAL | 0 refills | Status: DC | PRN

## 2021-04-24 MED ORDER — SULFAMETHOXAZOLE-TRIMETHOPRIM 800-160 MG PO TABS: 1.0000 | ORAL_TABLET | Freq: Two times a day (BID) | ORAL | 0 refills | Status: DC

## 2021-04-24 MED ORDER — METHOCARBAMOL 500 MG PO TABS: 500.0000 mg | ORAL_TABLET | Freq: Two times a day (BID) | ORAL | 0 refills | Status: DC | PRN

## 2021-04-25 NOTE — Progress Notes (Signed)
Surgical Instructions    Your procedure is scheduled on Monday 04/30/2021.  Report to [facility] Main Entrance "A" at 10:30 A.M., then check in with the Admitting office.  Call this number if you have problems the morning of surgery:  [phone]   If you have any questions prior to your surgery date call [phone]: Open Monday-Friday 8am-4pm    Remember:  Do not eat after midnight the night before your surgery  You may drink clear liquids until 09:30am the morning of your surgery.   Clear liquids allowed are: Water, Non-Citrus Juices (without pulp), Carbonated Beverages, Clear Tea, Black Coffee Only, and Gatorade   Enhanced Recovery after Surgery for Orthopedics Enhanced Recovery after Surgery is a protocol used to improve the stress on your body and your recovery after surgery.  Patient Instructions  . The night before surgery:  o No food after midnight. ONLY clear liquids after midnight  . The day of surgery (if you have diabetes):  o Drink ONE small bottle of water by 09:30am the morning of surgery o This bottle was given to you during your hospital  pre-op appointment visit.  o Nothing else to drink after completing the  Small bottle of water.         If you have questions, please contact your surgeon's office.     Take these medicines the morning of surgery with A SIP OF WATER: Artovastatin (Lipitor) Bictegravir-emtricitabine-tenofovir AF (Biktarvy) Methocarbamol (Robaxin) - if needed Ondansetron (Zofran) - if needed  As of today, STOP taking any celecoxib (Celebrex), Aleve, Naproxen, Ibuprofen, Motrin, Advil, Goody's, BC's, all herbal medications, fish oil, and all vitamins.  Follow your surgeon's instructions on when to stop Aspirin.  If no instructions were given by your surgeon then you will need to call the office to get those instructions.    WHAT DO I DO ABOUT MY DIABETES MEDICATION?  Marland Kitchen Do not take oral diabetes medicines (pills) the morning of  surgery. - Do NOT take your metformin (Glucophage) the morning of surgery.    HOW TO MANAGE YOUR DIABETES BEFORE AND AFTER SURGERY  Why is it important to control my blood sugar before and after surgery? . Improving blood sugar levels before and after surgery helps healing and can limit problems. . A way of improving blood sugar control is eating a healthy diet by: o  Eating less sugar and carbohydrates o  Increasing activity/exercise o  Talking with your doctor about reaching your blood sugar goals . High blood sugars (greater than 180 mg/dL) can raise your risk of infections and slow your recovery, so you will need to focus on controlling your diabetes during the weeks before surgery. . Make sure that the doctor who takes care of your diabetes knows about your planned surgery including the date and location.  How do I manage my blood sugar before surgery? . Check your blood sugar at least 4 times a day, starting 2 days before surgery, to make sure that the level is not too high or low. . Check your blood sugar the morning of your surgery when you wake up and every 2 hours until you get to the Short Stay unit. o If your blood sugar is less than 70 mg/dL, you will need to treat for low blood sugar: - Do not take insulin. - Treat a low blood sugar (less than 70 mg/dL) with  cup of clear juice (cranberry or apple), 4 glucose tablets, OR glucose gel. - Recheck blood sugar in 15  minutes after treatment (to make sure it is greater than 70 mg/dL). If your blood sugar is not greater than 70 mg/dL on recheck, call [phone] for further instructions. . Report your blood sugar to the short stay nurse when you get to Short Stay.  . If you are admitted to the hospital after surgery: o Your blood sugar will be checked by the staff and you will probably be given insulin after surgery (instead of oral diabetes medicines) to make sure you have good blood sugar levels. o The goal for blood sugar  control after surgery is 80-180 mg/dL.                       Do not wear jewelry, make up, or nail polish            Do not wear lotions, powders, perfumes/colognes, or deodorant.            Do not shave 48 hours prior to surgery.  Men may shave face and neck.            Do not bring valuables to the hospital.            [facility] is not responsible for any belongings or valuables.  Do NOT Smoke (Tobacco/Vaping) or drink Alcohol 24 hours prior to your procedure  If you use a CPAP at night, you may bring all equipment for your overnight stay.   Contacts, glasses, hearing aids, dentures or partials may not be worn into surgery, please bring cases for these belongings   For patients admitted to the hospital, discharge time will be determined by your treatment team.   Patients discharged the day of surgery will not be allowed to drive home, and someone needs to stay with them for 24 hours.    Special instructions:   [place]- Preparing For Surgery  Before surgery, you can play an important role. Because skin is not sterile, your skin needs to be as free of germs as possible. You can reduce the number of germs on your skin by washing with CHG (chlorahexidine gluconate) Soap before surgery.  CHG is an antiseptic cleaner which kills germs and bonds with the skin to continue killing germs even after washing.    Oral Hygiene is also important to reduce your risk of infection.  Remember - BRUSH YOUR TEETH THE MORNING OF SURGERY WITH YOUR REGULAR TOOTHPASTE  Please do not use if you have an allergy to CHG or antibacterial soaps. If your skin becomes reddened/irritated stop using the CHG.  Do not shave (including legs and underarms) for at least 48 hours prior to first CHG shower. It is OK to shave your face.  Please follow these instructions carefully.   1. Shower the NIGHT BEFORE SURGERY and the MORNING OF SURGERY  2. If you chose to wash your hair, wash your hair first as usual with  your normal shampoo.  3. After you shampoo, rinse your hair and body thoroughly to remove the shampoo.  4. Wash Face and genitals (private parts) with your normal soap.   5.  Shower the NIGHT BEFORE SURGERY and the MORNING OF SURGERY with CHG Soap.   6. Use CHG Soap as you would any other liquid soap. You can apply CHG directly to the skin and wash gently with a scrungie or a clean washcloth.   7. Apply the CHG Soap to your body ONLY FROM THE NECK DOWN.  Do not use on open  wounds or open sores. Avoid contact with your eyes, ears, mouth and genitals (private parts). Wash Face and genitals (private parts)  with your normal soap.   8. Wash thoroughly, paying special attention to the area where your surgery will be performed.  9. Thoroughly rinse your body with warm water from the neck down.  10. DO NOT shower/wash with your normal soap after using and rinsing off the CHG Soap.  11. Pat yourself dry with a CLEAN TOWEL.  12. Wear CLEAN PAJAMAS to bed the night before surgery  13. Place CLEAN SHEETS on your bed the night before your surgery  14. DO NOT SLEEP WITH PETS.   Day of Surgery: Take a shower with CHG soap. Wear Clean/Comfortable clothing the morning of surgery Do not apply any deodorants/lotions.   Remember to brush your teeth WITH YOUR REGULAR TOOTHPASTE.   Please read over the following fact sheets that you were given.

## 2021-04-26 ENCOUNTER — Other Ambulatory Visit: Payer: Self-pay

## 2021-04-26 ENCOUNTER — Other Ambulatory Visit (HOSPITAL_COMMUNITY): Payer: BC Managed Care – PPO

## 2021-04-26 ENCOUNTER — Encounter (HOSPITAL_COMMUNITY): Payer: Self-pay

## 2021-04-26 ENCOUNTER — Ambulatory Visit (HOSPITAL_COMMUNITY)
Admission: RE | Admit: 2021-04-26 | Discharge: 2021-04-26 | Disposition: A | Discharge status: Home / Self Care | Payer: BC Managed Care – PPO | Source: Ambulatory Visit | Attending: Physician Assistant | Admitting: Physician Assistant

## 2021-04-26 ENCOUNTER — Encounter (HOSPITAL_COMMUNITY)
Admission: RE | Admit: 2021-04-26 | Discharge: 2021-04-26 | Disposition: A | Discharge status: Home / Self Care | Payer: BC Managed Care – PPO | Source: Ambulatory Visit | Attending: Orthopaedic Surgery | Admitting: Orthopaedic Surgery

## 2021-04-26 DIAGNOSIS — M87052 Idiopathic aseptic necrosis of left femur: Secondary | ICD-10-CM

## 2021-04-26 DIAGNOSIS — Z20822 Contact with and (suspected) exposure to covid-19: Secondary | ICD-10-CM | POA: Insufficient documentation

## 2021-04-26 DIAGNOSIS — E1165 Type 2 diabetes mellitus with hyperglycemia: Secondary | ICD-10-CM | POA: Insufficient documentation

## 2021-04-26 DIAGNOSIS — Z01818 Encounter for other preprocedural examination: Secondary | ICD-10-CM | POA: Insufficient documentation

## 2021-04-26 DIAGNOSIS — R918 Other nonspecific abnormal finding of lung field: Secondary | ICD-10-CM | POA: Insufficient documentation

## 2021-04-26 HISTORY — DX: Human immunodeficiency virus (HIV) disease: B20

## 2021-04-26 HISTORY — DX: Asymptomatic human immunodeficiency virus (hiv) infection status: Z21

## 2021-04-26 LAB — TYPE AND SCREEN
ABO/RH(D): A POS
Antibody Screen: NEGATIVE

## 2021-04-26 LAB — COMPREHENSIVE METABOLIC PANEL
ALT: 22 U/L (ref 0–44)
AST: 18 U/L (ref 15–41)
Albumin: 3.8 g/dL (ref 3.5–5.0)
Alkaline Phosphatase: 74 U/L (ref 38–126)
Anion gap: 11 (ref 5–15)
BUN: 11 mg/dL (ref 8–23)
CO2: 28 mmol/L (ref 22–32)
Calcium: 9.3 mg/dL (ref 8.9–10.3)
Chloride: 97 mmol/L — ABNORMAL LOW (ref 98–111)
Creatinine, Ser: 0.86 mg/dL (ref 0.61–1.24)
GFR, Estimated: >60 mL/min (ref >60)
Glucose, Bld: 207 mg/dL — ABNORMAL HIGH (ref 70–99)
Potassium: 3.8 mmol/L (ref 3.5–5.1)
Sodium: 136 mmol/L (ref 135–145)
Total Bilirubin: 0.9 mg/dL (ref 0.3–1.2)
Total Protein: 6.9 g/dL (ref 6.5–8.1)

## 2021-04-26 LAB — SARS CORONAVIRUS 2 (TAT 6-24 HRS): SARS Coronavirus 2: NEGATIVE

## 2021-04-26 LAB — CBC WITH DIFFERENTIAL/PLATELET
Abs Immature Granulocytes: 0.03 10*3/uL (ref 0.00–0.07)
Basophils Absolute: 0.1 10*3/uL (ref 0.0–0.1)
Basophils Relative: 1 %
Eosinophils Absolute: 0.3 10*3/uL (ref 0.0–0.5)
Eosinophils Relative: 5 %
HCT: 47.1 % (ref 39.0–52.0)
Hemoglobin: 15.9 g/dL (ref 13.0–17.0)
Immature Granulocytes: 0 %
Lymphocytes Relative: 26 %
Lymphs Abs: 1.8 10*3/uL (ref 0.7–4.0)
MCH: 30.9 pg (ref 26.0–34.0)
MCHC: 33.8 g/dL (ref 30.0–36.0)
MCV: 91.5 fL (ref 80.0–100.0)
Monocytes Absolute: 0.5 10*3/uL (ref 0.1–1.0)
Monocytes Relative: 7 %
Neutro Abs: 4.2 10*3/uL (ref 1.7–7.7)
Neutrophils Relative %: 61 %
Platelets: 348 10*3/uL (ref 150–400)
RBC: 5.15 MIL/uL (ref 4.22–5.81)
RDW: 12.6 % (ref 11.5–15.5)
WBC: 6.9 10*3/uL (ref 4.0–10.5)
nRBC: 0 % (ref 0.0–0.2)

## 2021-04-26 LAB — URINALYSIS, ROUTINE W REFLEX MICROSCOPIC
Bacteria, UA: NONE SEEN
Bilirubin Urine: NEGATIVE
Glucose, UA: >=500 mg/dL — AB
Ketones, ur: NEGATIVE mg/dL
Leukocytes,Ua: NEGATIVE
Nitrite: NEGATIVE
Protein, ur: NEGATIVE mg/dL
Specific Gravity, Urine: 1.012 (ref 1.005–1.030)
pH: 6 (ref 5.0–8.0)

## 2021-04-26 LAB — SURGICAL PCR SCREEN
MRSA, PCR: NEGATIVE
Staphylococcus aureus: NEGATIVE

## 2021-04-26 LAB — GLUCOSE, CAPILLARY: Glucose-Capillary: 240 mg/dL — ABNORMAL HIGH (ref 70–99)

## 2021-04-26 LAB — APTT: aPTT: 27 seconds (ref 24–36)

## 2021-04-26 LAB — PROTIME-INR
INR: 1 (ref 0.8–1.2)
Prothrombin Time: 12.8 seconds (ref 11.4–15.2)

## 2021-04-26 LAB — HEMOGLOBIN A1C
Hgb A1c MFr Bld: 7.8 % — ABNORMAL HIGH (ref 4.8–5.6)
Mean Plasma Glucose: 177.16 mg/dL

## 2021-04-26 NOTE — Progress Notes (Signed)
A1C is 7.8 therefore surgery will need to be cancelled until it is under 7.8.  He needs to see his PCP to get his diabetes under control.  Recheck in 2-3 months.

## 2021-04-26 NOTE — Pre-Procedure Instructions (Signed)
Surgical Instructions    Your procedure is scheduled on Monday, May 16th.  Report to [facility] Main Entrance "A" at 10:30 A.M., then check in with the Admitting office.  Call this number if you have problems the morning of surgery:  [phone]   If you have any questions prior to your surgery date call [phone]: Open Monday-Friday 8am-4pm    Remember:  Do not eat after midnight the night before your surgery  You may drink clear liquids until 9:30 a.m. the morning of your surgery.   Clear liquids allowed are: Water, Non-Citrus Juices (without pulp), Carbonated Beverages, Clear Tea, Black Coffee Only, and Gatorade.   Enhanced Recovery after Surgery for Orthopedics Enhanced Recovery after Surgery is a protocol used to improve the stress on your body and your recovery after surgery.  Patient Instructions  . The night before surgery:  o No food after midnight. ONLY clear liquids after midnight  . The day of surgery (if you have diabetes):  o Drink ONE small bottle of water by 9:30 a.m. the morning of surgery o This bottle was given to you during your hospital  pre-op appointment visit.  o Nothing else to drink after completing the  Small bottle of water.         If you have questions, please contact your surgeon's office.     Take these medicines the morning of surgery with A SIP OF WATER atorvastatin (LIPITOR)  bictegravir-emtricitabine-tenofovir AF (BIKTARVY)  ondansetron (ZOFRAN)-as needed  Follow your surgeon's instructions on when to stop Aspirin.  If no instructions were given by your surgeon then you will need to call the office to get those instructions.    As of today, STOP taking any celecoxib (CELEBREX), Aleve, Naproxen, Ibuprofen, Motrin, Advil, Goody's, BC's, all herbal medications, fish oil, and all vitamins.   WHAT DO I DO ABOUT MY DIABETES MEDICATION?   Marland Kitchen Do not take metFORMIN (GLUCOPHAGE) the morning of surgery.    HOW TO MANAGE YOUR  DIABETES BEFORE AND AFTER SURGERY  Why is it important to control my blood sugar before and after surgery? . Improving blood sugar levels before and after surgery helps healing and can limit problems. . A way of improving blood sugar control is eating a healthy diet by: o  Eating less sugar and carbohydrates o  Increasing activity/exercise o  Talking with your doctor about reaching your blood sugar goals . High blood sugars (greater than 180 mg/dL) can raise your risk of infections and slow your recovery, so you will need to focus on controlling your diabetes during the weeks before surgery. . Make sure that the doctor who takes care of your diabetes knows about your planned surgery including the date and location.  How do I manage my blood sugar before surgery? . Check your blood sugar at least 4 times a day, starting 2 days before surgery, to make sure that the level is not too high or low. . Check your blood sugar the morning of your surgery when you wake up and every 2 hours until you get to the Short Stay unit. o If your blood sugar is less than 70 mg/dL, you will need to treat for low blood sugar: - Do not take insulin. - Treat a low blood sugar (less than 70 mg/dL) with  cup of clear juice (cranberry or apple), 4 glucose tablets, OR glucose gel. - Recheck blood sugar in 15 minutes after treatment (to make sure it is greater than 70 mg/dL). If  your blood sugar is not greater than 70 mg/dL on recheck, call [phone] for further instructions. . Report your blood sugar to the short stay nurse when you get to Short Stay.  . If you are admitted to the hospital after surgery: o Your blood sugar will be checked by the staff and you will probably be given insulin after surgery (instead of oral diabetes medicines) to make sure you have good blood sugar levels. o The goal for blood sugar control after surgery is 80-180 mg/dL.                      Do NOT Smoke (Tobacco/Vaping) or drink  Alcohol 24 hours prior to your procedure.  If you use a CPAP at night, you may bring all equipment for your overnight stay.   Contacts, glasses, piercing's, hearing aid's, dentures or partials may not be worn into surgery, please bring cases for these belongings.    For patients admitted to the hospital, discharge time will be determined by your treatment team.   Patients discharged the day of surgery will not be allowed to drive home, and someone needs to stay with them for 24 hours.    Special instructions:   [place]- Preparing For Surgery  Before surgery, you can play an important role. Because skin is not sterile, your skin needs to be as free of germs as possible. You can reduce the number of germs on your skin by washing with CHG (chlorahexidine gluconate) Soap before surgery.  CHG is an antiseptic cleaner which kills germs and bonds with the skin to continue killing germs even after washing.    Oral Hygiene is also important to reduce your risk of infection.  Remember - BRUSH YOUR TEETH THE MORNING OF SURGERY WITH YOUR REGULAR TOOTHPASTE  Please do not use if you have an allergy to CHG or antibacterial soaps. If your skin becomes reddened/irritated stop using the CHG.  Do not shave (including legs and underarms) for at least 48 hours prior to first CHG shower. It is OK to shave your face.  Please follow these instructions carefully.   1. Shower the NIGHT BEFORE SURGERY and the MORNING OF SURGERY  2. If you chose to wash your hair, wash your hair first as usual with your normal shampoo.  3. After you shampoo, rinse your hair and body thoroughly to remove the shampoo.  4. Use CHG Soap as you would any other liquid soap. You can apply CHG directly to the skin and wash gently with a scrungie or a clean washcloth.   5. Apply the CHG Soap to your body ONLY FROM THE NECK DOWN.  Do not use on open wounds or open sores. Avoid contact with your eyes, ears, mouth and genitals (private  parts). Wash Face and genitals (private parts)  with your normal soap.   6. Wash thoroughly, paying special attention to the area where your surgery will be performed.  7. Thoroughly rinse your body with warm water from the neck down.  8. DO NOT shower/wash with your normal soap after using and rinsing off the CHG Soap.  9. Pat yourself dry with a CLEAN TOWEL.  10. Wear CLEAN PAJAMAS to bed the night before surgery  11. Place CLEAN SHEETS on your bed the night before your surgery  12. DO NOT SLEEP WITH PETS.   Day of Surgery: Shower with CHG soap. Do not wear jewelry. Do not wear lotions, powders, colognes, or deodorant. Men may  shave face and neck. Do not bring valuables to the hospital. [facility] is not responsible for any belongings or valuables. Wear Clean/Comfortable clothing the morning of surgery Remember to brush your teeth WITH YOUR REGULAR TOOTHPASTE.   Please read over the following fact sheets that you were given.

## 2021-04-26 NOTE — Progress Notes (Signed)
PCP - Dr. Kathyrn Lass Cardiologist - Dr. Marlou Porch; last visit 05/24/19  Chest x-ray - 04/26/21  EKG - 12/26/20 Stress Test - 5+ years ECHO - denies Cardiac Cath - 2004  Sleep Study - denies CPAP - denies  Does not check CBG at home nor has the materials to do so. CBG at PAT 240. Will collect A1C today.  Blood Thinner Instructions: n/a Aspirin Instructions: n/a  ERAS Protcol - pt to stop clears by 0930 DOS. PRE-SURGERY Ensure or G2- pt given (1) 10 oz bottle of water.  COVID TEST- 04/26/21  Anesthesia review: No  Patient denies shortness of breath, fever, cough and chest pain at PAT appointment   All instructions explained to the patient, with a verbal understanding of the material. Patient agrees to go over the instructions while at home for a better understanding. Patient also instructed to self quarantine after being tested for COVID-19. The opportunity to ask questions was provided.

## 2021-04-27 ENCOUNTER — Telehealth: Payer: Self-pay | Admitting: Orthopaedic Surgery

## 2021-04-27 NOTE — Telephone Encounter (Signed)
Spoke to patient and explained everything.  He understands.  I would like to have him return in a month to redraw A1C at our office.  In the mean time, he will adjust his diet.  Please call him for an appointment for a month.  He also needs a letter for his job explaining that his surgery had to be postponed for at least 1 month due to medical reasons.  Thanks.

## 2021-04-27 NOTE — Telephone Encounter (Signed)
Pt called asking for a CB from [place], he states he isn't sure of his surgery status. Pt would like a CB as soon as possible please.  [phone]

## 2021-04-27 NOTE — Telephone Encounter (Signed)
I called patient yesterday and left voicemail message notifying him of the total hip cancellation scheduled for Monday May 16th at [facility] with Dr. Erlinda Hong.  I asked patient to call me ASAP.  Patient returned call this morning and  I explained his A1c needs to be less than 7.8 and Dr. Erlinda Hong advises him to see his PCP to get his diabetes under control while we plan to recheck him 2-3 months to see if he is  optimized for surgery.  Patient says he is very disappointed and "quite frankly upset" because with this cancellation, his job is on the line. He is requesting Dr. Erlinda Hong call him so he can explain why the A1c results relative to his Metformin Rx.  Patient says this diabetes ARE under control and the surgery must go forward as planned. Please call patient.      cb  [phone]

## 2021-04-27 NOTE — Telephone Encounter (Signed)
Appt made.  Letter ready for pick up. Patient aware.

## 2021-04-30 ENCOUNTER — Encounter (HOSPITAL_COMMUNITY): Admission: RE | Payer: Self-pay | Source: Home / Self Care

## 2021-04-30 ENCOUNTER — Ambulatory Visit (HOSPITAL_COMMUNITY)
Admission: RE | Admit: 2021-04-30 | Payer: BC Managed Care – PPO | Source: Home / Self Care | Admitting: Orthopaedic Surgery

## 2021-04-30 SURGERY — ARTHROPLASTY, HIP, TOTAL, ANTERIOR APPROACH
Anesthesia: Spinal | Site: Hip | Laterality: Left

## 2021-05-03 ENCOUNTER — Ambulatory Visit: Payer: BC Managed Care – PPO | Admitting: Orthopaedic Surgery

## 2021-05-15 ENCOUNTER — Encounter: Payer: BC Managed Care – PPO | Admitting: Orthopaedic Surgery

## 2021-05-23 ENCOUNTER — Other Ambulatory Visit: Payer: Self-pay | Admitting: Surgical

## 2021-05-29 ENCOUNTER — Encounter: Payer: Self-pay | Admitting: Orthopaedic Surgery

## 2021-05-29 ENCOUNTER — Ambulatory Visit (INDEPENDENT_AMBULATORY_CARE_PROVIDER_SITE_OTHER): Payer: BC Managed Care – PPO | Admitting: Orthopaedic Surgery

## 2021-05-29 VITALS — Ht 65.5 in | Wt 205.2 lb

## 2021-05-29 DIAGNOSIS — M87052 Idiopathic aseptic necrosis of left femur: Secondary | ICD-10-CM

## 2021-05-29 NOTE — Addendum Note (Signed)
Addended by: Precious Bard on: 05/29/2021 08:34 AM   Modules accepted: Orders

## 2021-05-29 NOTE — Progress Notes (Signed)
Office Visit Note   Patient: William Mcfarland           Date of Birth: [birth year]           MRN: [id number] Visit Date: 05/29/2021              Requested by: William Mcfarland, [place],  [place] [address] PCP: William Lass, MD   Assessment & Plan: Visit Diagnoses:  1. Avascular necrosis of bone of left hip ([place])     Plan: Today we repeated the A1c results.  We will notify the patient tomorrow whether it is acceptable or not.  He would like to have the surgery done ASAP as he is in severe pain and he is running out of sick time from work.  Follow-Up Instructions: No follow-ups on file.   Orders:  No orders of the defined types were placed in this encounter.  No orders of the defined types were placed in this encounter.     Procedures: No procedures performed   Clinical Data: No additional findings.   Subjective: Chief Complaint  Patient presents with   Left Hip - Pain    William Mcfarland returns today for repeat A1c.  Previous 1 was 7.8.  He states that he has lost 8 pounds since we last saw him and he has gone up on his metformin dosage.  Denies any other changes.   Review of Systems   Objective: Vital Signs: Ht 5' 5.5" (1.664 m)   Wt 205 lb 3.2 oz (93.1 kg)   BMI 33.63 kg/m   Physical Exam  Ortho Exam Left hip exam is unchanged. Specialty Comments:  No specialty comments available.  Imaging: No results found.   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   Healthcare maintenance 06/03/2019   Human immunodeficiency virus (HIV) disease ([place]) 05/03/2019   Type 2 diabetes mellitus with hyperglycemia, without long-term current use of insulin ([place]) 05/03/2019   Essential hypertension 05/03/2019   Class 1 obesity due to excess calories with serious comorbidity and body mass  index (BMI) of 32.0 to 32.9 in adult 05/03/2019   Degenerative arthritis of shoulder region 05/21/2016   Malignant neoplasm of prostate ([place]) 11/20/2014   Travel advice encounter 04/01/2014   Past Medical History:  Diagnosis Date   Arthritis    shoulders, knees, hips   At risk for sleep apnea    STOP-BANG= 5     SENT TO PCP 04-12-2015   History of colon polyps    History of radiation therapy 45 Gy plus seed implants on 04-13-2015   prostate external beam 02-20-2015 to 03-24-2015   HIV (human immunodeficiency virus infection) ([place])    dx in 2020.   Hyperlipidemia    Hypertension    Lower urinary tract symptoms (LUTS)    Prostate cancer [facility]) urologist-  William Mcfarland/  oncologist- William Mcfarland   T1c, Gleason 4+3,  PSA 6.83,  vol. 32.56cc--  s/p  external beam radiation 02-20-2015 to 03-24-2015   Type 2 diabetes mellitus (H[facility])    Type 2 diabetes, diet controlled ([place])    Wears glasses     Family History  Problem Relation Age of Onset   Stroke Father    Stroke Mother    Cancer Neg Hx     Past Surgical History:  Procedure Laterality Date   COLONOSCOPY W/ POLYPECTOMY  2013   EYE SURGERY     Cornea replacement left eye (2018)   JOINT REPLACEMENT     ORIF LEFT LEG FX  1988   ORIF RIGHT ANKLE FX  1985   PROSTATE BIOPSY     RADIOACTIVE SEED IMPLANT N/A 04/14/2015   Procedure: RADIOACTIVE SEED IMPLANT;  Surgeon: William Rhodes, MD;  Location: [facility];  Service: Urology;  Laterality: N/A;   64     seeds implanted no seeds found in bladder   TOTAL KNEE ARTHROPLASTY Left 01/16/2021   Procedure: LEFT TOTAL KNEE ARTHROPLASTY;  Surgeon: William Pel, MD;  Location: [facility];  Service: Orthopedics;  Laterality: Left;   TOTAL SHOULDER ARTHROPLASTY Right 05/21/2016   Procedure: TOTAL SHOULDER ARTHROPLASTY;  Surgeon: William Pel, MD;  Location: [place];  Service: Orthopedics;  Laterality: Right;   TOTAL SHOULDER ARTHROPLASTY Left 10/05/2019   Procedure: LEFT Total shoulder  arthroplasty;  Surgeon: William Pel, MD;  Location: [place];  Service: Orthopedics;  Laterality: Left;   Social History   Occupational History   Occupation: Air traffic controller  Tobacco Use   Smoking status: Former    Packs/day: 0.50    Years: 15.00    Pack years: 7.50    Types: Cigarettes    Quit date: 04/12/1999    Years since quitting: 22.1   Smokeless tobacco: Never  Vaping Use   Vaping Use: Never used  Substance and Sexual Activity   Alcohol use: Not Currently    Comment: occasional   Drug use: No   Sexual activity: Not on file

## 2021-05-30 ENCOUNTER — Other Ambulatory Visit: Payer: Self-pay

## 2021-05-30 ENCOUNTER — Telehealth: Payer: Self-pay | Admitting: Orthopaedic Surgery

## 2021-05-30 LAB — EXTRA SPECIMEN

## 2021-05-30 LAB — HEMOGLOBIN A1C
Hgb A1c MFr Bld: 7.3 % of total Hgb — ABNORMAL HIGH (ref <5.7)
Mean Plasma Glucose: 163 mg/dL
eAG (mmol/L): 9 mmol/L

## 2021-05-30 NOTE — Progress Notes (Signed)
A1c is good to go for surgery.  Tell him he needs to keep up the good work and stay disciplined.

## 2021-05-30 NOTE — Telephone Encounter (Signed)
See message below °

## 2021-05-30 NOTE — Telephone Encounter (Signed)
A1c was good to go for surgery.  I sent a message to Jackelyn Poling to call him to schedule surgery.

## 2021-05-30 NOTE — Telephone Encounter (Signed)
Patient called. He would like to know the results from his blood work done. His call back number is [phone]

## 2021-06-06 NOTE — Progress Notes (Signed)
Surgical Instructions    Your procedure is scheduled on Wednesday, June 29th.  Report to [facility] Main Entrance "A" at 10:30 A.M., then check in with the Admitting office.  Call this number if you have problems the morning of surgery:  [phone]   If you have any questions prior to your surgery date call [phone]: Open Monday-Friday 8am-4pm    Remember:  Do not eat after midnight the night before your surgery  You may drink clear liquids until 9:30 a.m. the morning of your surgery.   Clear liquids allowed are: Water, Non-Citrus Juices (without pulp), Carbonated Beverages, Clear Tea, Black Coffee Only, and Gatorade.  Patient Instructions  The night before surgery:  No food after midnight. ONLY clear liquids after midnight   The day of surgery (if you have diabetes): Drink ONE (1) 10 oz water bottle given to you in your pre admission testing appointment by 9:30 a.m. the morning of surgery. Drink in one sitting. Do not sip.  This drink was given to you during your hospital  pre-op appointment visit.  Nothing else to drink after completing the  10 oz bottle of water.          If you have questions, please contact your surgeon's office.     Take these medicines the morning of surgery with A SIP OF WATER  atorvastatin (LIPITOR) bictegravir-emtricitabine-tenofovir AF (BIKTARVY) gabapentin (NEURONTIN)   Take these medications AS NEEDED: ondansetron (ZOFRAN)    WHAT DO I DO ABOUT MY DIABETES MEDICATION?   Do not take oral diabetes medicines (pills) the morning of surgery.  THE NIGHT BEFORE SURGERY, take  metFORMIN (GLUCOPHAGE) as normal.   HOW TO MANAGE YOUR DIABETES BEFORE AND AFTER SURGERY  Why is it important to control my blood sugar before and after surgery? Improving blood sugar levels before and after surgery helps healing and can limit problems. A way of improving blood sugar control is eating a healthy diet by:  Eating less sugar and carbohydrates   Increasing activity/exercise  Talking with your doctor about reaching your blood sugar goals High blood sugars (greater than 180 mg/dL) can raise your risk of infections and slow your recovery, so you will need to focus on controlling your diabetes during the weeks before surgery. Make sure that the doctor who takes care of your diabetes knows about your planned surgery including the date and location.  How do I manage my blood sugar before surgery? Check your blood sugar at least 4 times a day, starting 2 days before surgery, to make sure that the level is not too high or low.  Check your blood sugar the morning of your surgery when you wake up and every 2 hours until you get to the Short Stay unit.  If your blood sugar is less than 70 mg/dL, you will need to treat for low blood sugar: Do not take insulin. Treat a low blood sugar (less than 70 mg/dL) with  cup of clear juice (cranberry or apple), 4 glucose tablets, OR glucose gel. Recheck blood sugar in 15 minutes after treatment (to make sure it is greater than 70 mg/dL). If your blood sugar is not greater than 70 mg/dL on recheck, call [phone] for further instructions. Report your blood sugar to the short stay nurse when you get to Short Stay.  If you are admitted to the hospital after surgery: Your blood sugar will be checked by the staff and you will probably be given insulin after surgery (instead of oral diabetes  medicines) to make sure you have good blood sugar levels. The goal for blood sugar control after surgery is 80-180 mg/dL.    As of today, STOP taking any Aspirin (unless otherwise instructed by your surgeon) Aleve, Naproxen, Ibuprofen, Motrin, Advil, Goody's, BC's, all herbal medications, fish oil, and all vitamins.          Do not wear jewelry. Do not wear lotions, powders, colognes, or deodorant. Do not shave 48 hours prior to surgery.  Men may shave face and neck. Do not bring valuables to the hospital.              [facility] is not responsible for any belongings or valuables.  Do NOT Smoke (Tobacco/Vaping) or drink Alcohol 24 hours prior to your procedure If you use a CPAP at night, you may bring all equipment for your overnight stay.   Contacts, glasses, dentures or bridgework may not be worn into surgery, please bring cases for these belongings   For patients admitted to the hospital, discharge time will be determined by your treatment team.   ONLY 1 SUPPORT PERSON MAY BE PRESENT WHILE YOU ARE IN SURGERY. IF YOU ARE TO BE ADMITTED ONCE YOU ARE IN YOUR ROOM YOU WILL BE ALLOWED TWO (2) VISITORS.  Minor children may have two parents present. Special consideration for safety and communication needs will be reviewed on a case by case basis.  Special instructions:    Oral Hygiene is also important to reduce your risk of infection.  Remember - BRUSH YOUR TEETH THE MORNING OF SURGERY WITH YOUR REGULAR TOOTHPASTE   [place]- Preparing For Surgery  Before surgery, you can play an important role. Because skin is not sterile, your skin needs to be as free of germs as possible. You can reduce the number of germs on your skin by washing with CHG (chlorahexidine gluconate) Soap before surgery.  CHG is an antiseptic cleaner which kills germs and bonds with the skin to continue killing germs even after washing.     Please do not use if you have an allergy to CHG or antibacterial soaps. If your skin becomes reddened/irritated stop using the CHG.  Do not shave (including legs and underarms) for at least 48 hours prior to first CHG shower. It is OK to shave your face.  Please follow these instructions carefully.     Shower the NIGHT BEFORE SURGERY and the MORNING OF SURGERY with CHG Soap.   If you chose to wash your hair, wash your hair first as usual with your normal shampoo. After you shampoo, rinse your hair and body thoroughly to remove the shampoo.  Then [employer] and genitals (private parts) with your  normal soap and rinse thoroughly to remove soap.  After that Use CHG Soap as you would any other liquid soap. You can apply CHG directly to the skin and wash gently with a scrungie or a clean washcloth.   Apply the CHG Soap to your body ONLY FROM THE NECK DOWN.  Do not use on open wounds or open sores. Avoid contact with your eyes, ears, mouth and genitals (private parts). Wash Face and genitals (private parts)  with your normal soap.   Wash thoroughly, paying special attention to the area where your surgery will be performed.  Thoroughly rinse your body with warm water from the neck down.  DO NOT shower/wash with your normal soap after using and rinsing off the CHG Soap.  Pat yourself dry with a CLEAN TOWEL.  Wear CLEAN PAJAMAS to bed the night before surgery  Place CLEAN SHEETS on your bed the night before your surgery  DO NOT SLEEP WITH PETS.   Day of Surgery:  Take a shower with CHG soap. Wear Clean/Comfortable clothing the morning of surgery Do not apply any deodorants/lotions/powders/colognes.   Remember to brush your teeth WITH YOUR REGULAR TOOTHPASTE.   Please read over the following fact sheets that you were given.

## 2021-06-06 NOTE — Progress Notes (Signed)
Surgical Instructions    Your procedure is scheduled on Wednesday, June 29th.  Report to [facility] Main Entrance "A" at 10:30 A.M., then check in with the Admitting office.  Call this number if you have problems the morning of surgery:  [phone]   If you have any questions prior to your surgery date call [phone]: Open Monday-Friday 8am-4pm    Remember:  Do not eat after midnight the night before your surgery  You may drink clear liquids until 9:30 a.m. the morning of your surgery.   Clear liquids allowed are: Water, Non-Citrus Juices (without pulp), Carbonated Beverages, Clear Tea, Black Coffee Only, and Gatorade.  Patient Instructions  The night before surgery:  No food after midnight. ONLY clear liquids after midnight   The day of surgery (if you have diabetes): Drink ONE (1) 10 oz water bottle given to you in your pre admission testing appointment by 9:30 a.m. the morning of surgery. Drink in one sitting. Do not sip.  This drink was given to you during your hospital  pre-op appointment visit.  Nothing else to drink after completing the  10 oz bottle of water.          If you have questions, please contact your surgeon's office.     Take these medicines the morning of surgery with A SIP OF WATER  atorvastatin (LIPITOR) bictegravir-emtricitabine-tenofovir AF (BIKTARVY) gabapentin (NEURONTIN)   Take these medications AS NEEDED: ondansetron (ZOFRAN)    WHAT DO I DO ABOUT MY DIABETES MEDICATION?   Do not take oral diabetes medicines (pills) the morning of surgery.  THE DAY OF SURGERY DO NOT TAKE metFORMIN (GLUCOPHAGE).   HOW TO MANAGE YOUR DIABETES BEFORE AND AFTER SURGERY  Why is it important to control my blood sugar before and after surgery? Improving blood sugar levels before and after surgery helps healing and can limit problems. A way of improving blood sugar control is eating a healthy diet by:  Eating less sugar and carbohydrates  Increasing  activity/exercise  Talking with your doctor about reaching your blood sugar goals High blood sugars (greater than 180 mg/dL) can raise your risk of infections and slow your recovery, so you will need to focus on controlling your diabetes during the weeks before surgery. Make sure that the doctor who takes care of your diabetes knows about your planned surgery including the date and location.  How do I manage my blood sugar before surgery? Check your blood sugar at least 4 times a day, starting 2 days before surgery, to make sure that the level is not too high or low.  Check your blood sugar the morning of your surgery when you wake up and every 2 hours until you get to the Short Stay unit.  If your blood sugar is less than 70 mg/dL, you will need to treat for low blood sugar: Do not take insulin. Treat a low blood sugar (less than 70 mg/dL) with  cup of clear juice (cranberry or apple), 4 glucose tablets, OR glucose gel. Recheck blood sugar in 15 minutes after treatment (to make sure it is greater than 70 mg/dL). If your blood sugar is not greater than 70 mg/dL on recheck, call [phone] for further instructions. Report your blood sugar to the short stay nurse when you get to Short Stay.  If you are admitted to the hospital after surgery: Your blood sugar will be checked by the staff and you will probably be given insulin after surgery (instead of oral diabetes medicines)  to make sure you have good blood sugar levels. The goal for blood sugar control after surgery is 80-180 mg/dL.    As of today, STOP taking any Aspirin (unless otherwise instructed by your surgeon) Aleve, Naproxen, Ibuprofen, Motrin, Advil, Goody's, BC's, all herbal medications, fish oil, celecoxib (CELEBREX) and all vitamins.          Do not wear jewelry. Do not wear lotions, powders, colognes, or deodorant. Do not shave 48 hours prior to surgery.  Men may shave face and neck. Do not bring valuables to the  hospital.             [facility] is not responsible for any belongings or valuables.  Do NOT Smoke (Tobacco/Vaping) or drink Alcohol 24 hours prior to your procedure If you use a CPAP at night, you may bring all equipment for your overnight stay.   Contacts, glasses, dentures or bridgework may not be worn into surgery, please bring cases for these belongings   For patients admitted to the hospital, discharge time will be determined by your treatment team.   ONLY 1 SUPPORT PERSON MAY BE PRESENT WHILE YOU ARE IN SURGERY. IF YOU ARE TO BE ADMITTED ONCE YOU ARE IN YOUR ROOM YOU WILL BE ALLOWED TWO (2) VISITORS.  Minor children may have two parents present. Special consideration for safety and communication needs will be reviewed on a case by case basis.  Special instructions:    Oral Hygiene is also important to reduce your risk of infection.  Remember - BRUSH YOUR TEETH THE MORNING OF SURGERY WITH YOUR REGULAR TOOTHPASTE   [facility]- Preparing For Surgery  Before surgery, you can play an important role. Because skin is not sterile, your skin needs to be as free of germs as possible. You can reduce the number of germs on your skin by washing with CHG (chlorahexidine gluconate) Soap before surgery.  CHG is an antiseptic cleaner which kills germs and bonds with the skin to continue killing germs even after washing.     Please do not use if you have an allergy to CHG or antibacterial soaps. If your skin becomes reddened/irritated stop using the CHG.  Do not shave (including legs and underarms) for at least 48 hours prior to first CHG shower. It is OK to shave your face.  Please follow these instructions carefully.     Shower the NIGHT BEFORE SURGERY and the MORNING OF SURGERY with CHG Soap.   If you chose to wash your hair, wash your hair first as usual with your normal shampoo. After you shampoo, rinse your hair and body thoroughly to remove the shampoo.  Then [employer] and genitals  (private parts) with your normal soap and rinse thoroughly to remove soap.  After that Use CHG Soap as you would any other liquid soap. You can apply CHG directly to the skin and wash gently with a scrungie or a clean washcloth.   Apply the CHG Soap to your body ONLY FROM THE NECK DOWN.  Do not use on open wounds or open sores. Avoid contact with your eyes, ears, mouth and genitals (private parts). Wash Face and genitals (private parts)  with your normal soap.   Wash thoroughly, paying special attention to the area where your surgery will be performed.  Thoroughly rinse your body with warm water from the neck down.  DO NOT shower/wash with your normal soap after using and rinsing off the CHG Soap.  Pat yourself dry with a CLEAN TOWEL.  Wear CLEAN PAJAMAS to bed the night before surgery  Place CLEAN SHEETS on your bed the night before your surgery  DO NOT SLEEP WITH PETS.   Day of Surgery: Take a shower with CHG soap. Wear Clean/Comfortable clothing the morning of surgery Do not apply any deodorants/lotions/powders/colognes.   Remember to brush your teeth WITH YOUR REGULAR TOOTHPASTE.   Please read over the following fact sheets that you were given.

## 2021-06-07 ENCOUNTER — Encounter (HOSPITAL_COMMUNITY)
Admission: RE | Admit: 2021-06-07 | Discharge: 2021-06-07 | Disposition: A | Discharge status: Home / Self Care | Payer: BC Managed Care – PPO | Source: Ambulatory Visit | Attending: Orthopaedic Surgery | Admitting: Orthopaedic Surgery

## 2021-06-07 ENCOUNTER — Encounter (HOSPITAL_COMMUNITY): Payer: Self-pay

## 2021-06-07 ENCOUNTER — Other Ambulatory Visit: Payer: Self-pay

## 2021-06-07 DIAGNOSIS — Z01812 Encounter for preprocedural laboratory examination: Secondary | ICD-10-CM | POA: Diagnosis not present

## 2021-06-07 LAB — COMPREHENSIVE METABOLIC PANEL
ALT: 21 U/L (ref 0–44)
AST: 17 U/L (ref 15–41)
Albumin: 4.2 g/dL (ref 3.5–5.0)
Alkaline Phosphatase: 60 U/L (ref 38–126)
Anion gap: 10 (ref 5–15)
BUN: 14 mg/dL (ref 8–23)
CO2: 26 mmol/L (ref 22–32)
Calcium: 9.7 mg/dL (ref 8.9–10.3)
Chloride: 100 mmol/L (ref 98–111)
Creatinine, Ser: 0.8 mg/dL (ref 0.61–1.24)
GFR, Estimated: >60 mL/min (ref >60)
Glucose, Bld: 145 mg/dL — ABNORMAL HIGH (ref 70–99)
Potassium: 3.8 mmol/L (ref 3.5–5.1)
Sodium: 136 mmol/L (ref 135–145)
Total Bilirubin: 1.2 mg/dL (ref 0.3–1.2)
Total Protein: 7.4 g/dL (ref 6.5–8.1)

## 2021-06-07 LAB — URINALYSIS, ROUTINE W REFLEX MICROSCOPIC
Bacteria, UA: NONE SEEN
Bilirubin Urine: NEGATIVE
Glucose, UA: NEGATIVE mg/dL
Ketones, ur: NEGATIVE mg/dL
Leukocytes,Ua: NEGATIVE
Nitrite: NEGATIVE
Protein, ur: NEGATIVE mg/dL
Specific Gravity, Urine: 1.01 (ref 1.005–1.030)
pH: 5 (ref 5.0–8.0)

## 2021-06-07 LAB — CBC WITH DIFFERENTIAL/PLATELET
Abs Immature Granulocytes: 0.04 K/uL (ref 0.00–0.07)
Basophils Absolute: 0.1 K/uL (ref 0.0–0.1)
Basophils Relative: 1 %
Eosinophils Absolute: 0.2 K/uL (ref 0.0–0.5)
Eosinophils Relative: 3 %
HCT: 45.8 % (ref 39.0–52.0)
Hemoglobin: 15.9 g/dL (ref 13.0–17.0)
Immature Granulocytes: 1 %
Lymphocytes Relative: 32 %
Lymphs Abs: 2.2 K/uL (ref 0.7–4.0)
MCH: 31.2 pg (ref 26.0–34.0)
MCHC: 34.7 g/dL (ref 30.0–36.0)
MCV: 89.8 fL (ref 80.0–100.0)
Monocytes Absolute: 0.6 K/uL (ref 0.1–1.0)
Monocytes Relative: 9 %
Neutro Abs: 3.8 K/uL (ref 1.7–7.7)
Neutrophils Relative %: 54 %
Platelets: 366 K/uL (ref 150–400)
RBC: 5.1 MIL/uL (ref 4.22–5.81)
RDW: 13.1 % (ref 11.5–15.5)
WBC: 6.8 K/uL (ref 4.0–10.5)
nRBC: 0 % (ref 0.0–0.2)

## 2021-06-07 LAB — APTT: aPTT: 27 seconds (ref 24–36)

## 2021-06-07 LAB — PROTIME-INR
INR: 1 (ref 0.8–1.2)
Prothrombin Time: 12.7 seconds (ref 11.4–15.2)

## 2021-06-07 LAB — TYPE AND SCREEN
ABO/RH(D): A POS
Antibody Screen: NEGATIVE

## 2021-06-07 LAB — SURGICAL PCR SCREEN
MRSA, PCR: NEGATIVE
Staphylococcus aureus: NEGATIVE

## 2021-06-07 LAB — GLUCOSE, CAPILLARY: Glucose-Capillary: 148 mg/dL — ABNORMAL HIGH (ref 70–99)

## 2021-06-07 NOTE — Progress Notes (Signed)
PCP - Kathyrn Lass Cardiologist - Dr. Marlou Porch (last visit) 05-24-19  Chest x-ray - 04-26-21 EKG - 12-26-20 Stress Test - 5+ years ECHO - denies Cardiac Cath - 2004  DM - Type 2 - pt does not check CBGs at home Last A1c 7.3 (taken on 05/29/21)   ERAS Protcol - yes, pt stated he already had water at home from previous PAT appointment   COVID TEST- 06/11/21   Anesthesia review: n/a  Patient denies shortness of breath, fever, cough and chest pain at PAT appointment   All instructions explained to the patient, with a verbal understanding of the material. Patient agrees to go over the instructions while at home for a better understanding. Patient also instructed to self quarantine after being tested for COVID-19. The opportunity to ask questions was provided.

## 2021-06-11 ENCOUNTER — Other Ambulatory Visit (HOSPITAL_COMMUNITY)
Admission: RE | Admit: 2021-06-11 | Discharge: 2021-06-11 | Disposition: A | Discharge status: Home / Self Care | Payer: BC Managed Care – PPO | Source: Ambulatory Visit | Attending: Orthopaedic Surgery | Admitting: Orthopaedic Surgery

## 2021-06-11 DIAGNOSIS — Z01812 Encounter for preprocedural laboratory examination: Secondary | ICD-10-CM | POA: Insufficient documentation

## 2021-06-11 DIAGNOSIS — Z20822 Contact with and (suspected) exposure to covid-19: Secondary | ICD-10-CM | POA: Insufficient documentation

## 2021-06-11 LAB — SARS CORONAVIRUS 2 (TAT 6-24 HRS): SARS Coronavirus 2: NEGATIVE

## 2021-06-12 DIAGNOSIS — M1612 Unilateral primary osteoarthritis, left hip: Secondary | ICD-10-CM

## 2021-06-12 MED ORDER — TRANEXAMIC ACID 1000 MG/10ML IV SOLN
2000.0000 mg | INTRAVENOUS | Status: DC
  Filled 2021-06-12: qty 20

## 2021-06-13 ENCOUNTER — Encounter (HOSPITAL_COMMUNITY): Payer: Self-pay | Admitting: Orthopaedic Surgery

## 2021-06-13 ENCOUNTER — Ambulatory Visit (HOSPITAL_COMMUNITY): Payer: BC Managed Care – PPO

## 2021-06-13 ENCOUNTER — Ambulatory Visit (HOSPITAL_COMMUNITY): Payer: BC Managed Care – PPO | Admitting: Certified Registered Nurse Anesthetist

## 2021-06-13 ENCOUNTER — Observation Stay (HOSPITAL_COMMUNITY)
Admission: RE | Admit: 2021-06-13 | Discharge: 2021-06-14 | Disposition: A | Discharge status: Home / Self Care | Payer: BC Managed Care – PPO | Source: Ambulatory Visit | Attending: Orthopaedic Surgery | Admitting: Orthopaedic Surgery

## 2021-06-13 ENCOUNTER — Observation Stay (HOSPITAL_COMMUNITY): Payer: BC Managed Care – PPO

## 2021-06-13 ENCOUNTER — Other Ambulatory Visit: Payer: Self-pay

## 2021-06-13 ENCOUNTER — Encounter (HOSPITAL_COMMUNITY)
Admission: RE | Disposition: A | Discharge status: Home / Self Care | Payer: Self-pay | Source: Ambulatory Visit | Attending: Orthopaedic Surgery

## 2021-06-13 ENCOUNTER — Other Ambulatory Visit: Payer: Self-pay | Admitting: Physician Assistant

## 2021-06-13 DIAGNOSIS — Z96651 Presence of right artificial knee joint: Secondary | ICD-10-CM | POA: Diagnosis not present

## 2021-06-13 DIAGNOSIS — Z419 Encounter for procedure for purposes other than remedying health state, unspecified: Secondary | ICD-10-CM

## 2021-06-13 DIAGNOSIS — I10 Essential (primary) hypertension: Secondary | ICD-10-CM | POA: Diagnosis not present

## 2021-06-13 DIAGNOSIS — Z79899 Other long term (current) drug therapy: Secondary | ICD-10-CM | POA: Diagnosis not present

## 2021-06-13 DIAGNOSIS — Z8546 Personal history of malignant neoplasm of prostate: Secondary | ICD-10-CM | POA: Insufficient documentation

## 2021-06-13 DIAGNOSIS — M1612 Unilateral primary osteoarthritis, left hip: Principal | ICD-10-CM | POA: Insufficient documentation

## 2021-06-13 DIAGNOSIS — Z7984 Long term (current) use of oral hypoglycemic drugs: Secondary | ICD-10-CM | POA: Diagnosis not present

## 2021-06-13 DIAGNOSIS — B20 Human immunodeficiency virus [HIV] disease: Secondary | ICD-10-CM | POA: Insufficient documentation

## 2021-06-13 DIAGNOSIS — Z96611 Presence of right artificial shoulder joint: Secondary | ICD-10-CM | POA: Diagnosis not present

## 2021-06-13 DIAGNOSIS — Z96612 Presence of left artificial shoulder joint: Secondary | ICD-10-CM | POA: Insufficient documentation

## 2021-06-13 DIAGNOSIS — Z87891 Personal history of nicotine dependence: Secondary | ICD-10-CM | POA: Diagnosis not present

## 2021-06-13 DIAGNOSIS — Z96642 Presence of left artificial hip joint: Secondary | ICD-10-CM

## 2021-06-13 DIAGNOSIS — E119 Type 2 diabetes mellitus without complications: Secondary | ICD-10-CM | POA: Diagnosis not present

## 2021-06-13 DIAGNOSIS — Z96649 Presence of unspecified artificial hip joint: Secondary | ICD-10-CM

## 2021-06-13 HISTORY — PX: TOTAL HIP ARTHROPLASTY: SHX124

## 2021-06-13 LAB — GLUCOSE, CAPILLARY
Glucose-Capillary: 152 mg/dL — ABNORMAL HIGH (ref 70–99)
Glucose-Capillary: 123 mg/dL — ABNORMAL HIGH (ref 70–99)
Glucose-Capillary: 131 mg/dL — ABNORMAL HIGH (ref 70–99)
Glucose-Capillary: 195 mg/dL — ABNORMAL HIGH (ref 70–99)
Glucose-Capillary: 227 mg/dL — ABNORMAL HIGH (ref 70–99)

## 2021-06-13 SURGERY — ARTHROPLASTY, HIP, TOTAL, ANTERIOR APPROACH
Anesthesia: Spinal | Site: Hip | Laterality: Left

## 2021-06-13 MED ORDER — ORAL CARE MOUTH RINSE: 15.0000 mL | Freq: Once | OROMUCOSAL | Status: AC

## 2021-06-13 MED ORDER — OXYCODONE HCL 5 MG PO TABS: 10.0000 mg | ORAL_TABLET | ORAL | Status: DC | PRN

## 2021-06-13 MED ORDER — BUPIVACAINE-MELOXICAM ER 400-12 MG/14ML IJ SOLN
INTRAMUSCULAR | Status: DC | PRN
  Administered 2021-06-13: 12 mL

## 2021-06-13 MED ORDER — ALBUMIN HUMAN 5 % IV SOLN: INTRAVENOUS | Status: DC | PRN

## 2021-06-13 MED ORDER — METOCLOPRAMIDE HCL 5 MG/ML IJ SOLN: 5.0000 mg | Freq: Three times a day (TID) | INTRAMUSCULAR | Status: DC | PRN

## 2021-06-13 MED ORDER — PANTOPRAZOLE SODIUM 40 MG PO TBEC
40.0000 mg | DELAYED_RELEASE_TABLET | Freq: Every day | ORAL | Status: DC
  Administered 2021-06-13 – 2021-06-14 (×2): 40 mg via ORAL
  Filled 2021-06-13 (×2): qty 1

## 2021-06-13 MED ORDER — BUPIVACAINE-MELOXICAM ER 400-12 MG/14ML IJ SOLN
INTRAMUSCULAR | Status: AC
  Filled 2021-06-13: qty 1

## 2021-06-13 MED ORDER — HYDROMORPHONE HCL 1 MG/ML IJ SOLN: 0.5000 mg | INTRAMUSCULAR | Status: DC | PRN

## 2021-06-13 MED ORDER — LACTATED RINGERS IV SOLN: INTRAVENOUS | Status: DC

## 2021-06-13 MED ORDER — POVIDONE-IODINE 10 % EX SWAB: 2.0000 "application " | Freq: Once | CUTANEOUS | Status: DC

## 2021-06-13 MED ORDER — ACETAMINOPHEN 325 MG PO TABS: 325.0000 mg | ORAL_TABLET | Freq: Four times a day (QID) | ORAL | Status: DC | PRN

## 2021-06-13 MED ORDER — PROPOFOL 10 MG/ML IV BOLUS
INTRAVENOUS | Status: DC | PRN
  Administered 2021-06-13: 40 mg via INTRAVENOUS
  Administered 2021-06-13 (×4): 20 mg via INTRAVENOUS

## 2021-06-13 MED ORDER — ONDANSETRON HCL 4 MG/2ML IJ SOLN: 4.0000 mg | Freq: Four times a day (QID) | INTRAMUSCULAR | Status: DC | PRN

## 2021-06-13 MED ORDER — DEXAMETHASONE SODIUM PHOSPHATE 10 MG/ML IJ SOLN
10.0000 mg | Freq: Once | INTRAMUSCULAR | Status: AC
  Administered 2021-06-14: 10 mg via INTRAVENOUS
  Filled 2021-06-13: qty 1

## 2021-06-13 MED ORDER — ACETAMINOPHEN 500 MG PO TABS
ORAL_TABLET | ORAL | Status: AC
  Filled 2021-06-13: qty 2

## 2021-06-13 MED ORDER — DEXAMETHASONE SODIUM PHOSPHATE 10 MG/ML IJ SOLN
INTRAMUSCULAR | Status: DC | PRN
  Administered 2021-06-13: 5 mg via INTRAVENOUS

## 2021-06-13 MED ORDER — OXYCODONE HCL 5 MG PO TABS
ORAL_TABLET | ORAL | Status: AC
  Filled 2021-06-13: qty 1

## 2021-06-13 MED ORDER — VANCOMYCIN HCL 1 G IV SOLR
INTRAVENOUS | Status: DC | PRN
  Administered 2021-06-13: 1000 mg via TOPICAL

## 2021-06-13 MED ORDER — DEXTROSE 5 % IV SOLN
500.0000 mg | Freq: Four times a day (QID) | INTRAVENOUS | Status: DC | PRN
  Filled 2021-06-13: qty 5

## 2021-06-13 MED ORDER — CHLORHEXIDINE GLUCONATE 0.12 % MT SOLN: 15.0000 mL | Freq: Once | OROMUCOSAL | Status: AC

## 2021-06-13 MED ORDER — HYDROCHLOROTHIAZIDE 12.5 MG PO CAPS
12.5000 mg | ORAL_CAPSULE | Freq: Every day | ORAL | Status: DC
  Administered 2021-06-13 – 2021-06-14 (×2): 12.5 mg via ORAL
  Filled 2021-06-13 (×2): qty 1

## 2021-06-13 MED ORDER — OXYCODONE HCL 5 MG PO TABS
5.0000 mg | ORAL_TABLET | ORAL | Status: DC | PRN
  Administered 2021-06-14: 5 mg via ORAL
  Filled 2021-06-13: qty 1

## 2021-06-13 MED ORDER — ACETAMINOPHEN 160 MG/5ML PO SOLN: 325.0000 mg | ORAL | Status: DC | PRN

## 2021-06-13 MED ORDER — 0.9 % SODIUM CHLORIDE (POUR BTL) OPTIME
TOPICAL | Status: DC | PRN
  Administered 2021-06-13: 1000 mL

## 2021-06-13 MED ORDER — ONDANSETRON HCL 4 MG PO TABS: 4.0000 mg | ORAL_TABLET | Freq: Four times a day (QID) | ORAL | Status: DC | PRN

## 2021-06-13 MED ORDER — CEFAZOLIN SODIUM-DEXTROSE 2-4 GM/100ML-% IV SOLN
2.0000 g | Freq: Four times a day (QID) | INTRAVENOUS | Status: AC
  Administered 2021-06-13 – 2021-06-14 (×2): 2 g via INTRAVENOUS
  Filled 2021-06-13 (×2): qty 100

## 2021-06-13 MED ORDER — ONDANSETRON HCL 4 MG/2ML IJ SOLN: 4.0000 mg | Freq: Once | INTRAMUSCULAR | Status: DC | PRN

## 2021-06-13 MED ORDER — MIDAZOLAM HCL 2 MG/2ML IJ SOLN
INTRAMUSCULAR | Status: AC
  Filled 2021-06-13: qty 2

## 2021-06-13 MED ORDER — DIPHENHYDRAMINE HCL 12.5 MG/5ML PO ELIX: 25.0000 mg | ORAL_SOLUTION | ORAL | Status: DC | PRN

## 2021-06-13 MED ORDER — POLYETHYLENE GLYCOL 3350 17 G PO PACK
17.0000 g | PACK | Freq: Every day | ORAL | Status: DC
  Administered 2021-06-13 – 2021-06-14 (×2): 17 g via ORAL
  Filled 2021-06-13 (×2): qty 1

## 2021-06-13 MED ORDER — PROPOFOL 500 MG/50ML IV EMUL
INTRAVENOUS | Status: DC | PRN
  Administered 2021-06-13: 50 ug/kg/min via INTRAVENOUS

## 2021-06-13 MED ORDER — DOCUSATE SODIUM 100 MG PO CAPS
100.0000 mg | ORAL_CAPSULE | Freq: Two times a day (BID) | ORAL | Status: DC
  Administered 2021-06-13 – 2021-06-14 (×2): 100 mg via ORAL
  Filled 2021-06-13 (×2): qty 1

## 2021-06-13 MED ORDER — MIDAZOLAM HCL 5 MG/5ML IJ SOLN
INTRAMUSCULAR | Status: DC | PRN
  Administered 2021-06-13: 2 mg via INTRAVENOUS

## 2021-06-13 MED ORDER — ACETAMINOPHEN 500 MG PO TABS
1000.0000 mg | ORAL_TABLET | Freq: Once | ORAL | Status: AC
  Administered 2021-06-13: 1000 mg via ORAL

## 2021-06-13 MED ORDER — OXYCODONE HCL 5 MG/5ML PO SOLN: 5.0000 mg | Freq: Once | ORAL | Status: AC | PRN

## 2021-06-13 MED ORDER — BUPIVACAINE IN DEXTROSE 0.75-8.25 % IT SOLN
INTRATHECAL | Status: DC | PRN
  Administered 2021-06-13: 1.6 mL via INTRATHECAL

## 2021-06-13 MED ORDER — LISINOPRIL 20 MG PO TABS
20.0000 mg | ORAL_TABLET | Freq: Every day | ORAL | Status: DC
  Administered 2021-06-13 – 2021-06-14 (×2): 20 mg via ORAL
  Filled 2021-06-13 (×2): qty 1

## 2021-06-13 MED ORDER — ONDANSETRON HCL 4 MG/2ML IJ SOLN
INTRAMUSCULAR | Status: DC | PRN
  Administered 2021-06-13: 4 mg via INTRAVENOUS

## 2021-06-13 MED ORDER — INSULIN ASPART 100 UNIT/ML IJ SOLN
0.0000 [IU] | Freq: Three times a day (TID) | INTRAMUSCULAR | Status: DC
  Administered 2021-06-14: 3 [IU] via SUBCUTANEOUS
  Administered 2021-06-14: 2 [IU] via SUBCUTANEOUS

## 2021-06-13 MED ORDER — CEFAZOLIN SODIUM-DEXTROSE 2-4 GM/100ML-% IV SOLN
INTRAVENOUS | Status: AC
  Filled 2021-06-13: qty 100

## 2021-06-13 MED ORDER — MENTHOL 3 MG MT LOZG: 1.0000 | LOZENGE | OROMUCOSAL | Status: DC | PRN

## 2021-06-13 MED ORDER — PHENOL 1.4 % MT LIQD: 1.0000 | OROMUCOSAL | Status: DC | PRN

## 2021-06-13 MED ORDER — MEPERIDINE HCL 25 MG/ML IJ SOLN: 6.2500 mg | INTRAMUSCULAR | Status: DC | PRN

## 2021-06-13 MED ORDER — ACETAMINOPHEN 325 MG PO TABS
325.0000 mg | ORAL_TABLET | ORAL | Status: DC | PRN
Start: 2021-06-13 — End: 2021-06-13

## 2021-06-13 MED ORDER — CEFAZOLIN SODIUM-DEXTROSE 2-4 GM/100ML-% IV SOLN
2.0000 g | INTRAVENOUS | Status: AC
  Administered 2021-06-13: 2 g via INTRAVENOUS

## 2021-06-13 MED ORDER — LIDOCAINE 2% (20 MG/ML) 5 ML SYRINGE
INTRAMUSCULAR | Status: DC | PRN
  Administered 2021-06-13: 40 mg via INTRAVENOUS

## 2021-06-13 MED ORDER — INSULIN ASPART 100 UNIT/ML IJ SOLN
4.0000 [IU] | Freq: Three times a day (TID) | INTRAMUSCULAR | Status: DC
  Administered 2021-06-14 (×2): 4 [IU] via SUBCUTANEOUS

## 2021-06-13 MED ORDER — ACETAMINOPHEN 500 MG PO TABS
1000.0000 mg | ORAL_TABLET | Freq: Four times a day (QID) | ORAL | Status: DC
  Administered 2021-06-13 – 2021-06-14 (×3): 1000 mg via ORAL
  Filled 2021-06-13 (×3): qty 2

## 2021-06-13 MED ORDER — CHLORHEXIDINE GLUCONATE 0.12 % MT SOLN
OROMUCOSAL | Status: AC
  Administered 2021-06-13: 15 mL via OROMUCOSAL
  Filled 2021-06-13: qty 15

## 2021-06-13 MED ORDER — TRANEXAMIC ACID-NACL 1000-0.7 MG/100ML-% IV SOLN
1000.0000 mg | INTRAVENOUS | Status: AC
  Administered 2021-06-13: 1000 mg via INTRAVENOUS

## 2021-06-13 MED ORDER — IRRISEPT - 450ML BOTTLE WITH 0.05% CHG IN STERILE WATER, USP 99.95% OPTIME
TOPICAL | Status: DC | PRN
  Administered 2021-06-13: 450 mL via TOPICAL

## 2021-06-13 MED ORDER — METOCLOPRAMIDE HCL 5 MG PO TABS: 5.0000 mg | ORAL_TABLET | Freq: Three times a day (TID) | ORAL | Status: DC | PRN

## 2021-06-13 MED ORDER — ALUM & MAG HYDROXIDE-SIMETH 200-200-20 MG/5ML PO SUSP: 30.0000 mL | ORAL | Status: DC | PRN

## 2021-06-13 MED ORDER — MAGNESIUM CITRATE PO SOLN: 1.0000 | Freq: Once | ORAL | Status: DC | PRN

## 2021-06-13 MED ORDER — PHENYLEPHRINE HCL-NACL 10-0.9 MG/250ML-% IV SOLN
INTRAVENOUS | Status: DC | PRN
  Administered 2021-06-13: 40 ug/min via INTRAVENOUS

## 2021-06-13 MED ORDER — OXYCODONE HCL 5 MG PO TABS
5.0000 mg | ORAL_TABLET | Freq: Once | ORAL | Status: AC | PRN
  Administered 2021-06-13: 5 mg via ORAL

## 2021-06-13 MED ORDER — SORBITOL 70 % SOLN
30.0000 mL | Freq: Every day | Status: DC | PRN
  Filled 2021-06-13: qty 30

## 2021-06-13 MED ORDER — TRANEXAMIC ACID 1000 MG/10ML IV SOLN
INTRAVENOUS | Status: DC | PRN
  Administered 2021-06-13: 2000 mg via TOPICAL

## 2021-06-13 MED ORDER — TRANEXAMIC ACID-NACL 1000-0.7 MG/100ML-% IV SOLN
INTRAVENOUS | Status: AC
  Filled 2021-06-13: qty 100

## 2021-06-13 MED ORDER — METHOCARBAMOL 500 MG PO TABS
500.0000 mg | ORAL_TABLET | Freq: Four times a day (QID) | ORAL | Status: DC | PRN
  Administered 2021-06-14: 500 mg via ORAL
  Filled 2021-06-13: qty 1

## 2021-06-13 MED ORDER — ASPIRIN 81 MG PO CHEW
81.0000 mg | CHEWABLE_TABLET | Freq: Two times a day (BID) | ORAL | Status: DC
  Administered 2021-06-13 – 2021-06-14 (×2): 81 mg via ORAL
  Filled 2021-06-13 (×2): qty 1

## 2021-06-13 MED ORDER — TRANEXAMIC ACID-NACL 1000-0.7 MG/100ML-% IV SOLN: 1000.0000 mg | Freq: Once | INTRAVENOUS | Status: DC

## 2021-06-13 MED ORDER — VANCOMYCIN HCL 1000 MG IV SOLR
INTRAVENOUS | Status: AC
  Filled 2021-06-13: qty 1000

## 2021-06-13 MED ORDER — FENTANYL CITRATE (PF) 100 MCG/2ML IJ SOLN: 25.0000 ug | INTRAMUSCULAR | Status: DC | PRN

## 2021-06-13 MED ORDER — BICTEGRAVIR-EMTRICITAB-TENOFOV 50-200-25 MG PO TABS
1.0000 | ORAL_TABLET | Freq: Every day | ORAL | Status: DC
  Filled 2021-06-13: qty 1

## 2021-06-13 MED ORDER — OXYCODONE HCL ER 10 MG PO T12A
10.0000 mg | EXTENDED_RELEASE_TABLET | Freq: Two times a day (BID) | ORAL | Status: DC
  Administered 2021-06-13 – 2021-06-14 (×2): 10 mg via ORAL
  Filled 2021-06-13 (×2): qty 1

## 2021-06-13 MED ORDER — SODIUM CHLORIDE 0.9 % IV SOLN: INTRAVENOUS | Status: DC

## 2021-06-13 SURGICAL SUPPLY — 62 items
BAG COUNTER SPONGE SURGICOUNT (BAG) ×2 IMPLANT
BAG DECANTER FOR FLEXI CONT (MISCELLANEOUS) ×3 IMPLANT
BAG SURGICOUNT SPONGE COUNTING (BAG) ×1
CELLS DAT CNTRL 66122 CELL SVR (MISCELLANEOUS) IMPLANT
COVER PERINEAL POST (MISCELLANEOUS) ×3 IMPLANT
COVER SURGICAL LIGHT HANDLE (MISCELLANEOUS) ×3 IMPLANT
DERMABOND ADVANCED (GAUZE/BANDAGES/DRESSINGS) ×2
DERMABOND ADVANCED .7 DNX12 (GAUZE/BANDAGES/DRESSINGS) ×1 IMPLANT
DRAPE C-ARM 42X72 X-RAY (DRAPES) ×3 IMPLANT
DRAPE POUCH INSTRU U-SHP 10X18 (DRAPES) ×3 IMPLANT
DRAPE STERI IOBAN 125X83 (DRAPES) ×3 IMPLANT
DRAPE U-SHAPE 47X51 STRL (DRAPES) ×6 IMPLANT
DRESSING AQUACEL AG SP 3.5X10 (GAUZE/BANDAGES/DRESSINGS) ×1 IMPLANT
DRSG AQUACEL AG ADV 3.5X10 (GAUZE/BANDAGES/DRESSINGS) ×3 IMPLANT
DRSG AQUACEL AG SP 3.5X10 (GAUZE/BANDAGES/DRESSINGS) ×3
DURAPREP 26ML APPLICATOR (WOUND CARE) ×6 IMPLANT
ELECT BLADE 4.0 EZ CLEAN MEGAD (MISCELLANEOUS) ×3
ELECT REM PT RETURN 9FT ADLT (ELECTROSURGICAL) ×3
ELECTRODE BLDE 4.0 EZ CLN MEGD (MISCELLANEOUS) ×1 IMPLANT
ELECTRODE REM PT RTRN 9FT ADLT (ELECTROSURGICAL) ×1 IMPLANT
GLOVE SURG LTX SZ7 (GLOVE) ×6 IMPLANT
GLOVE SURG NEOP MICRO LF SZ7.5 (GLOVE) ×3 IMPLANT
GLOVE SURG SYN 7.5  E (GLOVE) ×8
GLOVE SURG SYN 7.5 E (GLOVE) ×4 IMPLANT
GLOVE SURG UNDER POLY LF SZ7 (GLOVE) ×3 IMPLANT
GOWN STRL REIN XL XLG (GOWN DISPOSABLE) ×3 IMPLANT
GOWN STRL REUS W/ TWL LRG LVL3 (GOWN DISPOSABLE) IMPLANT
GOWN STRL REUS W/ TWL XL LVL3 (GOWN DISPOSABLE) ×1 IMPLANT
GOWN STRL REUS W/TWL LRG LVL3 (GOWN DISPOSABLE)
GOWN STRL REUS W/TWL XL LVL3 (GOWN DISPOSABLE) ×2
HANDPIECE INTERPULSE COAX TIP (DISPOSABLE) ×2
HEAD CERAMIC 36 PLUS5 (Hips) ×3 IMPLANT
HOOD PEEL AWAY FLYTE STAYCOOL (MISCELLANEOUS) ×6 IMPLANT
IV NS IRRIG 3000ML ARTHROMATIC (IV SOLUTION) ×3 IMPLANT
JET LAVAGE IRRISEPT WOUND (IRRIGATION / IRRIGATOR) ×3
KIT BASIN OR (CUSTOM PROCEDURE TRAY) ×3 IMPLANT
LAVAGE JET IRRISEPT WOUND (IRRIGATION / IRRIGATOR) ×1 IMPLANT
LINER NEUTRAL 52X36MM PLUS 4 (Liner) ×3 IMPLANT
MARKER SKIN DUAL TIP RULER LAB (MISCELLANEOUS) ×3 IMPLANT
NEEDLE SPNL 18GX3.5 QUINCKE PK (NEEDLE) ×3 IMPLANT
PACK TOTAL JOINT (CUSTOM PROCEDURE TRAY) ×3 IMPLANT
PACK UNIVERSAL I (CUSTOM PROCEDURE TRAY) ×3 IMPLANT
PIN SECTOR W/GRIP ACE CUP 52MM (Hips) ×3 IMPLANT
RTRCTR WOUND ALEXIS 18CM MED (MISCELLANEOUS)
SAW OSC TIP CART 19.5X105X1.3 (SAW) ×3 IMPLANT
SCREW 6.5MMX25MM (Screw) ×3 IMPLANT
SET HNDPC FAN SPRY TIP SCT (DISPOSABLE) ×1 IMPLANT
STAPLER VISISTAT 35W (STAPLE) IMPLANT
STEM FEMORAL SZ5 HIGH ACTIS (Stem) ×3 IMPLANT
SUT ETHIBOND 2 V 37 (SUTURE) ×3 IMPLANT
SUT VIC AB 0 CT1 27 (SUTURE) ×2
SUT VIC AB 0 CT1 27XBRD ANBCTR (SUTURE) ×1 IMPLANT
SUT VIC AB 1 CTX 36 (SUTURE) ×2
SUT VIC AB 1 CTX36XBRD ANBCTR (SUTURE) ×1 IMPLANT
SUT VIC AB 2-0 CT1 27 (SUTURE) ×4
SUT VIC AB 2-0 CT1 TAPERPNT 27 (SUTURE) ×2 IMPLANT
SYR 50ML LL SCALE MARK (SYRINGE) ×3 IMPLANT
TOWEL GREEN STERILE (TOWEL DISPOSABLE) ×3 IMPLANT
TRAY CATH 16FR W/PLASTIC CATH (SET/KITS/TRAYS/PACK) IMPLANT
TRAY FOLEY W/BAG SLVR 16FR (SET/KITS/TRAYS/PACK) ×2
TRAY FOLEY W/BAG SLVR 16FR ST (SET/KITS/TRAYS/PACK) ×1 IMPLANT
YANKAUER SUCT BULB TIP NO VENT (SUCTIONS) ×3 IMPLANT

## 2021-06-13 NOTE — Anesthesia Procedure Notes (Addendum)
Procedure Name: MAC Date/Time: 06/13/2021 12:45 PM Performed by: Janene Harvey, CRNA Pre-anesthesia Checklist: Patient identified, Emergency Drugs available, Suction available, Patient being monitored and Timeout performed Patient Re-evaluated:Patient Re-evaluated prior to induction Oxygen Delivery Method: Simple face mask Placement Confirmation: positive ETCO2 Dental Injury: Teeth and Oropharynx as per pre-operative assessment

## 2021-06-13 NOTE — Anesthesia Procedure Notes (Signed)
Spinal  Patient location during procedure: OR Start time: 06/13/2021 1:00 PM End time: 06/13/2021 1:05 PM Reason for block: surgical anesthesia Staffing Performed: anesthesiologist  Anesthesiologist: Freddrick March, MD Preanesthetic Checklist Completed: patient identified, IV checked, risks and benefits discussed, surgical consent, monitors and equipment checked, pre-op evaluation and timeout performed Spinal Block Patient position: sitting Prep: DuraPrep and site prepped and draped Patient monitoring: cardiac monitor, continuous pulse ox and blood pressure Approach: midline Location: L3-4 Injection technique: single-shot Needle Needle type: Pencan  Needle gauge: 24 G Needle length: 9 cm Assessment Sensory level: T6 Events: CSF return Additional Notes Functioning IV was confirmed and monitors were applied. Sterile prep and drape, including hand hygiene and sterile gloves were used. The patient was positioned and the spine was prepped. The skin was anesthetized with lidocaine.  Free flow of clear CSF was obtained prior to injecting local anesthetic into the CSF.  The spinal needle aspirated freely following injection.  The needle was carefully withdrawn.  The patient tolerated the procedure well.

## 2021-06-13 NOTE — Progress Notes (Signed)
PA on call informed that patient is DM2 and no orders to check blood sugars and Insulin orders. Will continue to monitor. Arthor Captain LPN

## 2021-06-13 NOTE — Transfer of Care (Signed)
Immediate Anesthesia Transfer of Care Note  Patient: William Mcfarland  Procedure(s) Performed: LEFT TOTAL HIP ARTHROPLASTY ANTERIOR APPROACH (Left: Hip)  Patient Location: PACU  Anesthesia Type:MAC and Spinal  Level of Consciousness: awake, alert  and patient cooperative  Airway & Oxygen Therapy: Patient Spontanous Breathing and Patient connected to face mask oxygen  Post-op Assessment: Report given to RN and Post -op Vital signs reviewed and stable  Post vital signs: Reviewed and stable  Last Vitals:  Vitals Value Taken Time  BP 117/87 06/13/21 1514  Temp    Pulse 73 06/13/21 1517  Resp 19 06/13/21 1517  SpO2 100 % 06/13/21 1517  Vitals shown include unvalidated device data.  Last Pain:  Vitals:   06/13/21 1021  TempSrc:   PainSc: 4       Patients Stated Pain Goal: 2 (40/37/54 3606)  Complications: No notable events documented.

## 2021-06-13 NOTE — H&P (Signed)
PREOPERATIVE H&P  Chief Complaint: left hip avascular necrosis  HPI: William Mcfarland is a 65 y.o. male who presents for surgical treatment of left hip avascular necrosis.  He denies any changes in medical history.  Past Medical History:  Diagnosis Date   Arthritis    shoulders, knees, hips   At risk for sleep apnea    STOP-BANG= 5     SENT TO PCP 04-12-2015   History of colon polyps    History of radiation therapy 45 Gy plus seed implants on 04-13-2015   prostate external beam 02-20-2015 to 03-24-2015   HIV (human immunodeficiency virus infection) ([place])    dx in 2020.   Hyperlipidemia    Hypertension    Lower urinary tract symptoms (LUTS)    Prostate cancer [facility]) urologist-  dr ottelin/  oncologist- dr Tammi Klippel   T1c, Gleason 4+3,  PSA 6.83,  vol. 32.56cc--  s/p  external beam radiation 02-20-2015 to 03-24-2015   Type 2 diabetes mellitus ([place])    Type 2 diabetes, diet controlled ([place])    Wears glasses    Past Surgical History:  Procedure Laterality Date   COLONOSCOPY W/ POLYPECTOMY  2013   EYE SURGERY     Cornea replacement left eye (2018)   JOINT REPLACEMENT     ORIF LEFT LEG FX  1988   ORIF RIGHT ANKLE FX  1985   PROSTATE BIOPSY     RADIOACTIVE SEED IMPLANT N/A 04/14/2015   Procedure: RADIOACTIVE SEED IMPLANT;  Surgeon: Kathie Rhodes, MD;  Location: [place];  Service: Urology;  Laterality: N/A;   64     seeds implanted no seeds found in bladder   TOTAL KNEE ARTHROPLASTY Left 01/16/2021   Procedure: LEFT TOTAL KNEE ARTHROPLASTY;  Surgeon: Meredith Pel, MD;  Location: [place];  Service: Orthopedics;  Laterality: Left;   TOTAL SHOULDER ARTHROPLASTY Right 05/21/2016   Procedure: TOTAL SHOULDER ARTHROPLASTY;  Surgeon: Meredith Pel, MD;  Location: [place];  Service: Orthopedics;  Laterality: Right;   TOTAL SHOULDER ARTHROPLASTY Left 10/05/2019   Procedure: LEFT Total shoulder arthroplasty;  Surgeon: Meredith Pel, MD;  Location: [place];   Service: Orthopedics;  Laterality: Left;   Social History   Socioeconomic History   Marital status: Divorced    Spouse name: Not on file   Number of children: 0   Years of education: 12   Highest education level: Not on file  Occupational History   Occupation: Maintenance Technician  Tobacco Use   Smoking status: Former    Packs/day: 0.50    Years: 15.00    Pack years: 7.50    Types: Cigarettes    Quit date: 04/12/1999    Years since quitting: 22.1   Smokeless tobacco: Never  Vaping Use   Vaping Use: Never used  Substance and Sexual Activity   Alcohol use: Not Currently    Comment: occasional   Drug use: No   Sexual activity: Not on file  Other Topics Concern   Not on file  Social History Narrative   Not on file   Social Determinants of Health   Financial Resource Strain: Not on file  Food Insecurity: Not on file  Transportation Needs: Not on file  Physical Activity: Not on file  Stress: Not on file  Social Connections: Not on file   Family History  Problem Relation Age of Onset   Stroke Father    Stroke Mother    Cancer Neg Hx  No Known Allergies Prior to Admission medications   Medication Sig Start Date End Date Taking? Authorizing Provider  atorvastatin (LIPITOR) 10 MG tablet Take 10 mg by mouth in the morning.   Yes [provider]  bictegravir-emtricitabine-tenofovir AF (BIKTARVY) 50-200-25 MG TABS tablet Take 1 tablet by mouth daily. Patient taking differently: Take 1 tablet by mouth daily in the afternoon. 03/06/21  Yes Golden Circle, FNP  celecoxib (CELEBREX) 100 MG capsule TAKE 1 CAPSULE(100 MG) BY MOUTH TWICE DAILY Patient taking differently: Take 100-300 mg by mouth daily as needed for moderate pain. 05/26/21  Yes Magnant, Gerrianne Scale, PA-C  lisinopril-hydrochlorothiazide (ZESTORETIC) 20-12.5 MG tablet Take 1 tablet by mouth 2 (two) times a day.   Yes [provider]  metFORMIN (GLUCOPHAGE) 500 MG tablet Take 1,000 mg by mouth 2  (two) times daily with a meal.   Yes [provider]  aspirin EC 81 MG tablet Take 1 tablet (81 mg total) by mouth 2 (two) times daily. To be taken after surgery 04/24/21   Aundra Dubin, PA-C  docusate sodium (COLACE) 100 MG capsule Take 1 capsule (100 mg total) by mouth daily as needed. Patient not taking: Reported on 06/01/2021 04/24/21 04/24/22  Aundra Dubin, PA-C  gabapentin (NEURONTIN) 300 MG capsule TAKE 1 CAPSULE(300 MG) BY MOUTH TWICE DAILY FOR 7 DAYS THEN TAKE 1 CAPSULE(300 MG) BY MOUTH DAILY FOR 7 DAYS 02/09/21   Meredith Pel, MD  methocarbamol (ROBAXIN) 500 MG tablet Take 1 tablet (500 mg total) by mouth 2 (two) times daily as needed. To be taken after surgery 04/24/21   Aundra Dubin, PA-C  ondansetron (ZOFRAN) 4 MG tablet Take 1 tablet (4 mg total) by mouth every 8 (eight) hours as needed for nausea or vomiting. 04/24/21   Aundra Dubin, PA-C  oxyCODONE-acetaminophen (PERCOCET) 5-325 MG tablet Take 1-2 tablets by mouth every 6 (six) hours as needed. To be taken after surgery 04/24/21   Aundra Dubin, PA-C  sulfamethoxazole-trimethoprim (BACTRIM DS) 800-160 MG tablet Take 1 tablet by mouth 2 (two) times daily. To be taken after surgery 04/24/21   Aundra Dubin, PA-C  traMADol (ULTRAM) 50 MG tablet Take 1 tablet (50 mg total) by mouth at bedtime as needed. Patient not taking: No sig reported 04/02/21   Magnant, Charles L, PA-C     Positive ROS: All other systems have been reviewed and were otherwise negative with the exception of those mentioned in the HPI and as above.  Physical Exam: General: Alert, no acute distress Cardiovascular: No pedal edema Respiratory: No cyanosis, no use of accessory musculature GI: abdomen soft Skin: No lesions in the area of chief complaint Neurologic: Sensation intact distally Psychiatric: Patient is competent for consent with normal mood and affect Lymphatic: no lymphedema  MUSCULOSKELETAL: exam stable  Assessment: left  hip avascular necrosis  Plan: Plan for Procedure(s): LEFT TOTAL HIP ARTHROPLASTY ANTERIOR APPROACH  The risks benefits and alternatives were discussed with the patient including but not limited to the risks of nonoperative treatment, versus surgical intervention including infection, bleeding, nerve injury,  blood clots, cardiopulmonary complications, morbidity, mortality, among others, and they were willing to proceed.   Preoperative templating of the joint replacement has been completed, documented, and submitted to the Operating Room personnel in order to optimize intra-operative equipment management.   Eduard Roux, MD 06/13/2021 10:12 AM

## 2021-06-13 NOTE — Progress Notes (Signed)
PT Cancellation Note  Patient Details Name: William Mcfarland MRN: [id number] DOB: [birth year]   Cancelled Treatment:     Pt not to room yet at 1705.  Will f/u tomorrow for PT Raynaldo Opitz, PT Acute Rehab Services Pager [phone] [facility] [place] 06/13/2021, 5:05 PM

## 2021-06-13 NOTE — Discharge Instructions (Signed)

## 2021-06-13 NOTE — Op Note (Signed)
LEFT TOTAL HIP ARTHROPLASTY ANTERIOR APPROACH  Procedure Note LARUE LIGHTNER   [id number]  Pre-op Diagnosis: left hip avascular necrosis     Post-op Diagnosis: same   Operative Procedures  1. Total hip replacement; Left hip; uncemented [id number]   Surgeon: Frankey Shown, M.D.  Assist: Madalyn Rob, PA-C   Anesthesia: spinal  Prosthesis: Depuy Acetabulum: Pinnacle 52 mm Femur: Actis 5 HO Head: 36 mm size: +5 Liner: +4 Bearing Type: ceramic/poly  Total Hip Arthroplasty (Anterior Approach) Op Note:  After informed consent was obtained and the operative extremity marked in the holding area, the patient was brought back to the operating room and placed supine on the HANA table. Next, the operative extremity was prepped and draped in normal sterile fashion. Surgical timeout occurred verifying patient identification, surgical site, surgical procedure and administration of antibiotics.  A modified anterior Smith-Peterson approach to the hip was performed, using the interval between tensor fascia lata and sartorius.  Dissection was carried bluntly down onto the anterior hip capsule. The lateral femoral circumflex vessels were identified and coagulated. A capsulotomy was performed and the capsular flaps tagged for later repair.  The neck osteotomy was performed. The femoral head was removed which showed classic AVN changes, the acetabular rim was cleared of soft tissue and attention was turned to reaming the acetabulum.  Sequential reaming was performed under fluoroscopic guidance. We reamed to a size 51 mm, and then impacted the acetabular shell. A 25 mm cancellous screw was placed through the shell for added fixation.  The liner was then placed after irrigation and attention turned to the femur.  After placing the femoral hook, the leg was taken to externally rotated, extended and adducted position taking care to perform soft tissue releases to allow for adequate mobilization of the  femur. Soft tissue was cleared from the shoulder of the greater trochanter and the hook elevator used to improve exposure of the proximal femur. Sequential broaching performed up to a size 5. Trial neck and head were placed. The leg was brought back up to neutral and the construct reduced.  Antibiotic irrigation was placed in the surgical wound and kept for at least 1 minute.  The position and sizing of components, offset and leg lengths were checked using fluoroscopy. Stability of the construct was checked in extension and external rotation without any subluxation or impingement of prosthesis. We dislocated the prosthesis, dropped the leg back into position, removed trial components, and irrigated copiously. The final stem and head was then placed, the leg brought back up, the system reduced and fluoroscopy used to verify positioning.  We irrigated, obtained hemostasis and closed the capsule using #2 ethibond suture.  One gram of vancomycin powder was placed in the surgical bed.   One gram of topical tranexamic acid was injected into the joint.  The fascia was closed with #1 vicryl plus, the deep fat layer was closed with 0 vicryl, the subcutaneous layers closed with 2.0 Vicryl Plus and the skin closed with 2.0 nylon and dermabond. A sterile dressing was applied. The patient was awakened in the operating room and taken to recovery in stable condition.  All sponge, needle, and instrument counts were correct at the end of the case.   Tawanna Cooler, my PA, was a medical necessity for opening, closing, limb positioning, retracting, exposing, and overall facilitation and timely completion of the surgery.  Position: supine  Complications: see description of procedure.  Time Out: performed   Drains/Packing: none  Estimated blood  loss: see anesthesia record  Returned to Recovery Room: in good condition.   Antibiotics: yes   Mechanical VTE (DVT) Prophylaxis: sequential compression devices, TED  thigh-high  Chemical VTE (DVT) Prophylaxis: aspirin   Fluid Replacement: see anesthesia record  Specimens Removed: 1 to pathology   Sponge and Instrument Count Correct? yes   PACU: portable radiograph - low AP   Plan/RTC: Return in 2 weeks for staple removal. Weight Bearing/Load Lower Extremity: full  Hip precautions: none Suture Removal: 2 weeks   N. Eduard Roux, MD Marga Hoots 2:29 PM   Implant Name Type Inv. Item Serial No. Manufacturer Lot No. LRB No. Used Action  PIN SECTOR W/GRIP ACE CUP 52MM - [id number] Hips PIN SECTOR W/GRIP ACE CUP 52MM  [employer] [id number] Left 1 Implanted  LINER NEUTRAL 52X36MM PLUS 4 - [id number] Liner LINER NEUTRAL 52X36MM PLUS 4  [employer] [id number] Left 1 Implanted  SCREW 6.5MMX25MM - [id number] Screw SCREW 6.5MMX25MM  [employer] [id number] Left 1 Implanted  STEM FEMORAL SZ5 HIGH ACTIS - [id number] Stem STEM FEMORAL SZ5 HIGH ACTIS  [employer] [id number] Left 1 Implanted  HEAD CERAMIC 36 PLUS5 - [id number] Hips HEAD CERAMIC 36 PLUS5  [employer] [id number] Left 1 Implanted

## 2021-06-13 NOTE — Anesthesia Preprocedure Evaluation (Addendum)
Anesthesia Evaluation  Patient identified by MRN, date of birth, ID band Patient awake    Reviewed: Allergy & Precautions, NPO status , Patient's Chart, lab work & pertinent test results  Airway Mallampati: II  TM Distance: >3 FB Neck ROM: Full    Dental no notable dental hx. (+) Teeth Intact, Dental Advisory Given   Pulmonary sleep apnea , former smoker,    Pulmonary exam normal breath sounds clear to auscultation       Cardiovascular hypertension, Pt. on medications Normal cardiovascular exam Rhythm:Regular Rate:Normal     Neuro/Psych negative neurological ROS  negative psych ROS   GI/Hepatic negative GI ROS, Neg liver ROS,   Endo/Other  negative endocrine ROSdiabetes, Type 2, Oral Hypoglycemic Agents  Renal/GU negative Renal ROS  negative genitourinary   Musculoskeletal  (+) Arthritis ,   Abdominal   Peds  Hematology  (+) HIV,   Anesthesia Other Findings   Reproductive/Obstetrics                            Anesthesia Physical Anesthesia Plan  ASA: 3  Anesthesia Plan: Spinal   Post-op Pain Management:    Induction:   PONV Risk Score and Plan: Treatment may vary due to age or medical condition  Airway Management Planned: Natural Airway  Additional Equipment:   Intra-op Plan:   Post-operative Plan:   Informed Consent: I have reviewed the patients History and Physical, chart, labs and discussed the procedure including the risks, benefits and alternatives for the proposed anesthesia with the patient or authorized representative who has indicated his/her understanding and acceptance.     Dental advisory given  Plan Discussed with: CRNA  Anesthesia Plan Comments:         Anesthesia Quick Evaluation

## 2021-06-14 ENCOUNTER — Encounter (HOSPITAL_COMMUNITY): Payer: Self-pay | Admitting: Orthopaedic Surgery

## 2021-06-14 DIAGNOSIS — M1612 Unilateral primary osteoarthritis, left hip: Secondary | ICD-10-CM | POA: Diagnosis not present

## 2021-06-14 LAB — CBC
HCT: 37.2 % — ABNORMAL LOW (ref 39.0–52.0)
Hemoglobin: 13.4 g/dL (ref 13.0–17.0)
MCH: 31.8 pg (ref 26.0–34.0)
MCHC: 36 g/dL (ref 30.0–36.0)
MCV: 88.4 fL (ref 80.0–100.0)
Platelets: 304 10*3/uL (ref 150–400)
RBC: 4.21 MIL/uL — ABNORMAL LOW (ref 4.22–5.81)
RDW: 12.9 % (ref 11.5–15.5)
WBC: 8.6 10*3/uL (ref 4.0–10.5)
nRBC: 0 % (ref 0.0–0.2)

## 2021-06-14 LAB — BASIC METABOLIC PANEL
Anion gap: 8 (ref 5–15)
BUN: 10 mg/dL (ref 8–23)
CO2: 26 mmol/L (ref 22–32)
Calcium: 9.2 mg/dL (ref 8.9–10.3)
Chloride: 101 mmol/L (ref 98–111)
Creatinine, Ser: 0.77 mg/dL (ref 0.61–1.24)
GFR, Estimated: >60 mL/min (ref >60)
Glucose, Bld: 176 mg/dL — ABNORMAL HIGH (ref 70–99)
Potassium: 3.9 mmol/L (ref 3.5–5.1)
Sodium: 135 mmol/L (ref 135–145)

## 2021-06-14 LAB — GLUCOSE, CAPILLARY
Glucose-Capillary: 138 mg/dL — ABNORMAL HIGH (ref 70–99)
Glucose-Capillary: 176 mg/dL — ABNORMAL HIGH (ref 70–99)

## 2021-06-14 NOTE — Progress Notes (Signed)
Subjective: 1 Day Post-Op Procedure(s) (LRB): LEFT TOTAL HIP ARTHROPLASTY ANTERIOR APPROACH (Left) Patient reports pain as mild.  Feeling great this am  Objective: Vital signs in last 24 hours: Temp:  [97 F (36.1 C)-98.2 F (36.8 C)] 97.3 F (36.3 C) (06/30 0300) Pulse Rate:  [61-89] 81 (06/30 0300) Resp:  [8-21] 17 (06/30 0300) BP: (119-177)/(74-99) 135/81 (06/30 0300) SpO2:  [94 %-99 %] 96 % (06/30 0300) Weight:  [92.5 kg] 92.5 kg (06/29 1006)  Intake/Output from previous day: 06/29 0701 - 06/30 0700 In: 2625 [I.V.:2175; IV Piggyback:450] Out: 2850 [Urine:2600; Blood:250] Intake/Output this shift: No intake/output data recorded.  Recent Labs    06/14/21 0044  HGB 13.4   Recent Labs    06/14/21 0044  WBC 8.6  RBC 4.21*  HCT 37.2*  PLT 304   Recent Labs    06/14/21 0044  NA 135  K 3.9  CL 101  CO2 26  BUN 10  CREATININE 0.77  GLUCOSE 176*  CALCIUM 9.2   No results for input(s): LABPT, INR in the last 72 hours.  Neurologically intact Neurovascular intact Sensation intact distally Intact pulses distally Dorsiflexion/Plantar flexion intact Incision: dressing C/D/I No cellulitis present Compartment soft   Assessment/Plan: 1 Day Post-Op Procedure(s) (LRB): LEFT TOTAL HIP ARTHROPLASTY ANTERIOR APPROACH (Left) Advance diet Up with therapy D/C IV fluids Discharge home with home health after second PT session as long as he mobilizes well.  No stairs to get into apt. F/u with Dr. Erlinda Hong in two weeks      Aundra Dubin 06/14/2021, 7:54 AM

## 2021-06-14 NOTE — Plan of Care (Signed)

## 2021-06-14 NOTE — TOC Transition Note (Signed)
Transition of Care [facility]) - CM/SW Discharge Note   Patient Details  Name: William Mcfarland MRN: [id number] Date of Birth: [birth year]  [facility] CM/SW Contact:  Sharin Mons, RN Phone Number: 06/14/2021, 10:46 AM   Clinical Narrative:    Patient will DC to: home Anticipated DC date: 06/14/2021 Family notified: yes Transport by: car     -s/p L THA Per MD patient ready for DC today. RN, patient, patient's family, and [place] notified of DC. Pt with rolling walker and bedside commode @ home.   Pt without Rx MED concerns or affordability issues.  Post hospital f/u noted on AVS. Pt with transportation to home.  RNCM will sign off for now as intervention is no longer needed. Please consult [place] again if new needs arise.    Final next level of care: Home w Home Health Services Barriers to Discharge: No Barriers Identified   Patient Goals and CMS Choice     Choice offered to / list presented to : Patient  Discharge Placement                       Discharge Plan and Services                DME Arranged: 3-N-1, Walker rolling,   Order cancelled pt already has DME : ROLLING WALKER AND 3 IN 1/ [facility] @ home. DME Agency: [employer] Date DME Agency Contacted: 06/14/21 Time DME Agency Contacted: [phone] Representative spoke with at DME Agency: Freda Munro HH Arranged: PT [place]: [place] Date [place]: 06/14/21 Time [place]: [id number] Representative spoke with at [place]: [place] ([place]) Interventions     Readmission Risk Interventions No flowsheet data found.

## 2021-06-14 NOTE — Anesthesia Postprocedure Evaluation (Signed)
Anesthesia Post Note  Patient: MARQUET FAIRCLOTH  Procedure(s) Performed: LEFT TOTAL HIP ARTHROPLASTY ANTERIOR APPROACH (Left: Hip)     Patient location during evaluation: PACU Anesthesia Type: Spinal Level of consciousness: oriented and awake and alert Pain management: pain level controlled Vital Signs Assessment: post-procedure vital signs reviewed and stable Respiratory status: spontaneous breathing, respiratory function stable and patient connected to nasal cannula oxygen Cardiovascular status: blood pressure returned to baseline and stable Postop Assessment: no headache, no backache and no apparent nausea or vomiting Anesthetic complications: no   No notable events documented.  Last Vitals:  Vitals:   06/14/21 0814 06/14/21 1126  BP: (!) 142/81 132/73  Pulse: 80 82  Resp: 20 18  Temp: 36.6 C 36.6 C  SpO2: 96% 94%    Last Pain:  Vitals:   06/14/21 1126  TempSrc: Oral  PainSc:                  Norleen Xie L Katerina Zurn

## 2021-06-14 NOTE — Progress Notes (Signed)
Physical Therapy Treatment Patient Details Name: William Mcfarland MRN: [id number] DOB: 1956/12/11 Today's Date: 06/14/2021    History of Present Illness Pt is 65 yo male with L hip AVN and is s/p L hip anterior THA on 06/13/21.  He has medical hx including arthritis, L TKA, Bil TSA,  DM,  HIV, HTN, prostate CA, and hyperlipidemia.    PT Comments    Patient progressing with mobility and able to simulate car transfer as well as demonstrate ability to perform HEP.  Feel stable for home with follow up as noted below.   Follow Up Recommendations  Follow surgeon's recommendation for DC plan and follow-up therapies     Equipment Recommendations  None recommended by PT    Recommendations for Other Services       Precautions / Restrictions Precautions Precautions: Fall Restrictions Weight Bearing Restrictions: No    Mobility  Bed Mobility Overal bed mobility: Needs Assistance Bed Mobility: Supine to Sit     Supine to sit: Supervision     General bed mobility comments: up in room with RW    Transfers Overall transfer level: Needs assistance Equipment used: Rolling walker (2 wheeled) Transfers: Sit to/from Stand Sit to Stand: Modified independent (Device/Increase time)         General transfer comment: practiced doing car transfer on EOM in gym and pt able to perform but has to help L LE in and out  Ambulation/Gait Ambulation/Gait assistance: Modified independent (Device/Increase time) Gait Distance (Feet): 150 Feet Assistive device: Rolling walker (2 wheeled) Gait Pattern/deviations: Step-to pattern;Step-through pattern     General Gait Details: up in room on his own with RW without LOB, admits to forgetting walker till he feels his hip when stepping then retrieves it   Chief Strategy Officer    Modified Rankin (Stroke Patients Only)       Balance Overall balance assessment: Mild deficits observed, not formally tested                                           Cognition Arousal/Alertness: Awake/alert Behavior During Therapy: WFL for tasks assessed/performed Overall Cognitive Status: Within Functional Limits for tasks assessed                                        Exercises Total Joint Exercises Hip ABduction/ADduction: AROM;Left;10 reps;Standing Long Arc Quad: AROM;Left;10 reps;Seated Knee Flexion: AROM;Left;10 reps;Standing Marching in Standing: AROM;Left;10 reps;Standing Standing Hip Extension: AROM;Left;10 reps;Standing    General Comments General comments (skin integrity, edema, etc.): Issued HEP for home and reviewed with pt      Pertinent Vitals/Pain Pain Assessment: Faces Faces Pain Scale: Hurts little more Pain Location: L hip Pain Descriptors / Indicators: Sore;Burning Pain Intervention(s): Monitored during session;Repositioned    Home Living     Prior Function          PT Goals (current goals can now be found in the care plan section) Acute Rehab PT Goals Patient Stated Goal: return home PT Goal Formulation: With patient Time For Goal Achievement: 06/21/21 Potential to Achieve Goals: Good Progress towards PT goals: Progressing toward goals    Frequency    7X/week      PT Plan Current plan remains appropriate  Co-evaluation              AM-PAC PT "6 Clicks" Mobility   Outcome Measure  Help needed turning from your back to your side while in a flat bed without using bedrails?: None Help needed moving from lying on your back to sitting on the side of a flat bed without using bedrails?: None Help needed moving to and from a bed to a chair (including a wheelchair)?: None Help needed standing up from a chair using your arms (e.g., wheelchair or bedside chair)?: None Help needed to walk in hospital room?: None Help needed climbing 3-5 steps with a railing? : A Little 6 Click Score: 23    End of Session   Activity Tolerance:  Patient tolerated treatment well Patient left: in chair;with call bell/phone within reach   PT Visit Diagnosis: Other abnormalities of gait and mobility (R26.89)     Time: 1215-1228 PT Time Calculation (min) (ACUTE ONLY): 13 min  Charges:   $Therapeutic Activity: 8-22 mins                     Magda Kiel, PT Acute Rehabilitation Services Pager:[phone] [phone] 06/14/2021    Reginia Naas 06/14/2021, 12:40 PM

## 2021-06-14 NOTE — Discharge Summary (Signed)
Patient ID: William Mcfarland MRN: [id number] DOB/AGE: 12-27-1955 65 y.o.  Admit date: 06/13/2021 Discharge date: 06/14/2021  Admission Diagnoses:  Principal Problem:   Primary osteoarthritis of left hip Active Problems:   Status post total replacement of left hip   Discharge Diagnoses:  Same  Past Medical History:  Diagnosis Date   Arthritis    shoulders, knees, hips   At risk for sleep apnea    STOP-BANG= 5     SENT TO PCP 04-12-2015   History of colon polyps    History of radiation therapy 45 Gy plus seed implants on 04-13-2015   prostate external beam 02-20-2015 to 03-24-2015   HIV (human immunodeficiency virus infection) ([place])    dx in 2020.   Hyperlipidemia    Hypertension    Lower urinary tract symptoms (LUTS)    Prostate cancer [facility]) urologist-  dr ottelin/  oncologist- dr Tammi Klippel   T1c, Gleason 4+3,  PSA 6.83,  vol. 32.56cc--  s/p  external beam radiation 02-20-2015 to 03-24-2015   Type 2 diabetes mellitus ([place])    Type 2 diabetes, diet controlled ([place])    Wears glasses     Surgeries: Procedure(s): LEFT TOTAL HIP ARTHROPLASTY ANTERIOR APPROACH on 06/13/2021   Consultants:   Discharged Condition: Improved  Hospital Course: William Mcfarland is an 65 y.o. male who was admitted 06/13/2021 for operative treatment ofPrimary osteoarthritis of left hip. Patient has severe unremitting pain that affects sleep, daily activities, and work/hobbies. After pre-op clearance the patient was taken to the operating room on 06/13/2021 and underwent  Procedure(s): LEFT TOTAL HIP ARTHROPLASTY ANTERIOR APPROACH.    Patient was given perioperative antibiotics:  Anti-infectives (From admission, onward)    Start     Dose/Rate Route Frequency Ordered Stop   06/14/21 1500  bictegravir-emtricitabine-tenofovir AF (BIKTARVY) 50-200-25 MG per tablet 1 tablet        1 tablet Oral Daily 06/13/21 1932     06/13/21 2100  ceFAZolin (ANCEF) IVPB 2g/100 mL premix        2 g 200 mL/hr over  30 Minutes Intravenous Every 6 hours 06/13/21 1933 06/14/21 0317   06/13/21 1311  vancomycin (VANCOCIN) powder  Status:  Discontinued          As needed 06/13/21 1311 06/13/21 1507   06/13/21 1015  ceFAZolin (ANCEF) IVPB 2g/100 mL premix        2 g 200 mL/hr over 30 Minutes Intravenous On call to O.R. 06/13/21 1006 06/13/21 1311   06/13/21 1009  ceFAZolin (ANCEF) 2-4 GM/100ML-% IVPB       Note to Pharmacy: Cameron Sprang   : cabinet override      06/13/21 1009 06/13/21 1311        Patient was given sequential compression devices, early ambulation, and chemoprophylaxis to prevent DVT.  Patient benefited maximally from hospital stay and there were no complications.    Recent vital signs: Patient Vitals for the past 24 hrs:  BP Temp Temp src Pulse Resp SpO2 Height Weight  06/14/21 0300 135/81 (!) 97.3 F (36.3 C) Oral 81 17 96 % -- --  06/13/21 2327 139/82 98.1 F (36.7 C) Oral 88 16 95 % -- --  06/13/21 2000 (!) 177/89 98.2 F (36.8 C) Oral 83 16 94 % -- --  06/13/21 1915 (!) 162/99 97.8 F (36.6 C) -- 88 20 96 % -- --  06/13/21 1845 (!) 171/95 -- -- 86 17 95 % -- --  06/13/21 1815 139/88 -- -- 77 13  99 % -- --  06/13/21 1745 134/83 (!) 97 F (36.1 C) -- 80 (!) 21 94 % -- --  06/13/21 1730 134/83 -- -- 75 13 95 % -- --  06/13/21 1715 (!) 141/76 -- -- 76 20 94 % -- --  06/13/21 1700 134/81 -- -- 76 18 98 % -- --  06/13/21 1645 133/80 -- -- 71 (!) 9 96 % -- --  06/13/21 1630 138/80 -- -- 74 16 96 % -- --  06/13/21 1615 135/82 -- -- 66 10 97 % -- --  06/13/21 1600 139/78 -- -- 72 20 97 % -- --  06/13/21 1545 124/74 -- -- 65 14 99 % -- --  06/13/21 1530 119/74 -- -- 61 (!) 8 99 % -- --  06/13/21 1515 -- 97.6 F (36.4 C) -- -- -- -- -- --  06/13/21 1006 (!) 163/84 98.2 F (36.8 C) Oral 89 18 97 % 5\' 6"  (1.676 m) 92.5 kg     Recent laboratory studies:  Recent Labs    06/14/21 0044  WBC 8.6  HGB 13.4  HCT 37.2*  PLT 304  NA 135  K 3.9  CL 101  CO2 26  BUN 10   CREATININE 0.77  GLUCOSE 176*  CALCIUM 9.2     Discharge Medications:   Allergies as of 06/14/2021   No Known Allergies      Medication List     STOP taking these medications    traMADol 50 MG tablet Commonly known as: ULTRAM       TAKE these medications    aspirin EC 81 MG tablet Take 1 tablet (81 mg total) by mouth 2 (two) times daily. To be taken after surgery   atorvastatin 10 MG tablet Commonly known as: LIPITOR Take 10 mg by mouth in the morning.   docusate sodium 100 MG capsule Commonly known as: Colace Take 1 capsule (100 mg total) by mouth daily as needed.   gabapentin 300 MG capsule Commonly known as: NEURONTIN TAKE 1 CAPSULE(300 MG) BY MOUTH TWICE DAILY FOR 7 DAYS THEN TAKE 1 CAPSULE(300 MG) BY MOUTH DAILY FOR 7 DAYS   lisinopril-hydrochlorothiazide 20-12.5 MG tablet Commonly known as: ZESTORETIC Take 1 tablet by mouth 2 (two) times a day.   metFORMIN 500 MG tablet Commonly known as: GLUCOPHAGE Take 1,000 mg by mouth 2 (two) times daily with a meal.   methocarbamol 500 MG tablet Commonly known as: Robaxin Take 1 tablet (500 mg total) by mouth 2 (two) times daily as needed. To be taken after surgery   ondansetron 4 MG tablet Commonly known as: Zofran Take 1 tablet (4 mg total) by mouth every 8 (eight) hours as needed for nausea or vomiting.   oxyCODONE-acetaminophen 5-325 MG tablet Commonly known as: Percocet Take 1-2 tablets by mouth every 6 (six) hours as needed. To be taken after surgery   sulfamethoxazole-trimethoprim 800-160 MG tablet Commonly known as: BACTRIM DS Take 1 tablet by mouth 2 (two) times daily. To be taken after surgery       ASK your doctor about these medications    bictegravir-emtricitabine-tenofovir AF 50-200-25 MG Tabs tablet Commonly known as: BIKTARVY Take 1 tablet by mouth daily.   celecoxib 100 MG capsule Commonly known as: CELEBREX TAKE 1 CAPSULE(100 MG) BY MOUTH TWICE DAILY                Durable Medical Equipment  (From admission, onward)           Start  Ordered   06/13/21 1933  DME Walker rolling  Once       Question:  Patient needs a walker to treat with the following condition  Answer:  History of hip replacement   06/13/21 1932   06/13/21 1933  DME 3 n 1  Once        06/13/21 1932   06/13/21 1933  DME Bedside commode  Once       Question:  Patient needs a bedside commode to treat with the following condition  Answer:  History of hip replacement   06/13/21 1932            Diagnostic Studies: DG Pelvis Portable  Result Date: 06/13/2021 CLINICAL DATA:  Total left hip arthroplasty, anterior approach EXAM: PORTABLE PELVIS 1-2 VIEWS COMPARISON:  Radiographs 06/13/2021, 04/02/2021 FINDINGS: Patient is post placement of a total left hip arthroplasty with screw fixed acetabular component and collared femoral stem. Hardware appears in grossly normal alignment on this single frontal only view. No acute prosthetic complication is seen at this time. Expected postsurgical soft tissue changes noted in the lateral hip. Metallic prostate brachytherapy implants are seen. Vascular calcium in the pelvis. Some mild degenerative changes noted in the right hip and bilateral SI joints. Remaining osseous structures are unremarkable. No unexpected radiopaque foreign body. IMPRESSION: Expected postsurgical changes following total left hip arthroplasty without acute complication. No unexpected radiopaque foreign body. See operative report for further details. Atherosclerosis. Prostate brachytherapy implants. Electronically Signed   By: Lovena Le M.D.   On: 06/13/2021 16:16   DG C-Arm 1-60 Min  Result Date: 06/13/2021 CLINICAL DATA:  Left total hip arthroplasty. EXAM: DG C-ARM 1-60 MIN; DG HIP (WITH OR WITHOUT PELVIS) 2-3V LEFT FLUOROSCOPY TIME:  Fluoroscopy Time:  21 seconds. Radiation Exposure Index (if provided by the fluoroscopic device): 3.26 mGy. Number of Acquired Spot Images: 5  COMPARISON:  April 02, 2021. FINDINGS: Five C-arm fluoroscopic images were obtained intraoperatively and submitted for post operative interpretation. These images demonstrate changes of left total hip arthroplasty. No unexpected findings. Suspected prostate radiation beads. Please see the performing provider's procedural report for further detail. IMPRESSION: Left total hip arthroplasty. Electronically Signed   By: Margaretha Sheffield MD   On: 06/13/2021 15:51   DG HIP UNILAT WITH PELVIS 2-3 VIEWS LEFT  Result Date: 06/13/2021 CLINICAL DATA:  Left total hip arthroplasty. EXAM: DG C-ARM 1-60 MIN; DG HIP (WITH OR WITHOUT PELVIS) 2-3V LEFT FLUOROSCOPY TIME:  Fluoroscopy Time:  21 seconds. Radiation Exposure Index (if provided by the fluoroscopic device): 3.26 mGy. Number of Acquired Spot Images: 5 COMPARISON:  April 02, 2021. FINDINGS: Five C-arm fluoroscopic images were obtained intraoperatively and submitted for post operative interpretation. These images demonstrate changes of left total hip arthroplasty. No unexpected findings. Suspected prostate radiation beads. Please see the performing provider's procedural report for further detail. IMPRESSION: Left total hip arthroplasty. Electronically Signed   By: Margaretha Sheffield MD   On: 06/13/2021 15:51    Disposition: Discharge disposition: 01-Home or Self Care          Follow-up Information     Leandrew Koyanagi, MD. Schedule an appointment as soon as possible for a visit in 2 week(s).   Specialty: Orthopedic Surgery Contact information: [address] [place] [address] [phone]                  Signed: Aundra Dubin 06/14/2021, 7:56 AM

## 2021-06-14 NOTE — Evaluation (Signed)
Occupational Therapy Evaluation Patient Details Name: William Mcfarland MRN: [id number] DOB: [birth year] Today's Date: 06/14/2021    History of Present Illness Pt is 65 yo male with L hip AVN and is s/p L hip anterior THA on 06/13/21.  He has medical hx including arthritis, L TKA, Bil TSA,  DM,  HIV, HTN, prostate CA, and hyperlipidemia.   Clinical Impression   Pt is functioning at a supervision level for safety in ADL and mobility with RW. He will have a friend staying with him for a few days and then will have PRN assist for transportation and errands. All education completed with pt verbalizing and/or demonstrating understanding.     Follow Up Recommendations  No OT follow up    Equipment Recommendations  None recommended by OT    Recommendations for Other Services       Precautions / Restrictions Precautions Precautions: Fall Restrictions Weight Bearing Restrictions: No      Mobility Bed Mobility Overal bed mobility: Modified Independent             General bed mobility comments: HOB up, use of rail    Transfers Overall transfer level: Needs assistance Equipment used: Rolling walker (2 wheeled) Transfers: Sit to/from Stand Sit to Stand: Supervision              Balance                                           ADL either performed or assessed with clinical judgement   ADL Overall ADL's :  (Supervision for safety)                                       General ADL Comments: Issued and instructed in use of sock aid. Instructed in tub transfer using 3 in 1.     Vision Baseline Vision/History: Wears glasses Wears Glasses: At all times Patient Visual Report: No change from baseline       Perception     Praxis      Pertinent Vitals/Pain Pain Assessment: Faces Faces Pain Scale: Hurts little more Pain Location: L hip Pain Descriptors / Indicators: Sore;Burning Pain Intervention(s): Monitored during  session;RN gave pain meds during session;Repositioned;Ice applied     Hand Dominance Right   Extremity/Trunk Assessment Upper Extremity Assessment Upper Extremity Assessment: Overall WFL for tasks assessed   Lower Extremity Assessment Lower Extremity Assessment: Defer to PT evaluation   Cervical / Trunk Assessment Cervical / Trunk Assessment: Normal   Communication Communication Communication: No difficulties   Cognition Arousal/Alertness: Awake/alert Behavior During Therapy: WFL for tasks assessed/performed Overall Cognitive Status: Within Functional Limits for tasks assessed                                     General Comments       Exercises     Shoulder Instructions      Home Living Family/patient expects to be discharged to:: Private residence Living Arrangements: Alone Available Help at Discharge: Friend(s);Available PRN/intermittently Type of Home: Apartment Home Access: Level entry     Home Layout: One level     Bathroom Shower/Tub: Teacher, early years/pre: Standard     Home Equipment:  Walker - 2 wheels;Bedside commode;Adaptive equipment;Cane - quad Adaptive Equipment: Reacher        Prior Functioning/Environment Level of Independence: Independent                 OT Problem List:        OT Treatment/Interventions:      OT Goals(Current goals can be found in the care plan section) Acute Rehab OT Goals Patient Stated Goal: return home  OT Frequency:     Barriers to D/C:            Co-evaluation              AM-PAC OT "6 Clicks" Daily Activity     Outcome Measure Help from another person eating meals?: None Help from another person taking care of personal grooming?: None Help from another person toileting, which includes using toliet, bedpan, or urinal?: None Help from another person bathing (including washing, rinsing, drying)?: None Help from another person to put on and taking off regular upper  body clothing?: None Help from another person to put on and taking off regular lower body clothing?: None 6 Click Score: 24   End of Session Equipment Utilized During Treatment: Rolling walker;Gait belt  Activity Tolerance: Patient tolerated treatment well Patient left: in chair;with call bell/phone within reach  OT Visit Diagnosis: Unsteadiness on feet (R26.81)                Time: 0721-8288 OT Time Calculation (min): 40 min Charges:  OT General Charges $OT Visit: 1 Visit OT Evaluation $OT Eval Low Complexity: 1 Low OT Treatments $Self Care/Home Management : 23-37 mins  Nestor Lewandowsky, OTR/L Acute Rehabilitation Services Pager: [phone] Office: [phone]  Malka So 06/14/2021, 9:28 AM

## 2021-06-14 NOTE — Evaluation (Signed)
Physical Therapy Evaluation Patient Details Name: William Mcfarland MRN: [id number] DOB: 02/09/1956 Today's Date: 06/14/2021   History of Present Illness  Pt is 65 yo male with L hip AVN and is s/p L hip anterior THA on 06/13/21.  He has medical hx including arthritis, L TKA, Bil TSA,  DM,  HIV, HTN, prostate CA, and hyperlipidemia.  Clinical Impression  Patient presents with mobility slightly limited by pain and stiffness L LE.  He reports has a friend to stay with him a few days during transition home.  Reviewed exercises in supine, bed mobility, transfers and gait.  Will return to issue HEP and practice/simulate car transfer prior to d/c.     Follow Up Recommendations Follow surgeon's recommendation for DC plan and follow-up therapies    Equipment Recommendations  None recommended by PT    Recommendations for Other Services       Precautions / Restrictions Precautions Precautions: Fall Restrictions Weight Bearing Restrictions: No      Mobility  Bed Mobility Overal bed mobility: Needs Assistance Bed Mobility: Supine to Sit     Supine to sit: Supervision     General bed mobility comments: HOB flat using belt to help L LE and struggles to lift trunk, but eventually able    Transfers Overall transfer level: Needs assistance Equipment used: Rolling walker (2 wheeled) Transfers: Sit to/from Stand Sit to Stand: Modified independent (Device/Increase time)         General transfer comment: stood while I was getting his mask  Ambulation/Gait Ambulation/Gait assistance: Supervision Gait Distance (Feet): 200 Feet Assistive device: Rolling walker (2 wheeled) Gait Pattern/deviations: Step-through pattern;Decreased stride length;Antalgic     General Gait Details: appropriately sore, but progressing the leg well, slight antalgia  Stairs            Wheelchair Mobility    Modified Rankin (Stroke Patients Only)       Balance Overall balance assessment: Mild  deficits observed, not formally tested                                           Pertinent Vitals/Pain Pain Assessment: Faces Faces Pain Scale: Hurts little more Pain Location: L hip Pain Descriptors / Indicators: Sore;Burning Pain Intervention(s): Monitored during session;Repositioned;Ice applied    Home Living Family/patient expects to be discharged to:: Private residence Living Arrangements: Alone Available Help at Discharge: Friend(s);Available PRN/intermittently Type of Home: Apartment Home Access: Level entry     Home Layout: One level Home Equipment: Walker - 2 wheels;Bedside commode;Adaptive equipment;Cane - quad      Prior Function Level of Independence: Independent               Hand Dominance   Dominant Hand: Right    Extremity/Trunk Assessment   Upper Extremity Assessment Upper Extremity Assessment: Defer to OT evaluation    Lower Extremity Assessment Lower Extremity Assessment: LLE deficits/detail LLE Deficits / Details: ankle AROM WFL, hip limited with stiffness/pain, but flexes for sitting appropriately, knee with well healed incision from TKA earlier this year, AROM WFL, strength at least 3+/5 knee extension, hip flexion 2/5    Cervical / Trunk Assessment Cervical / Trunk Assessment: Normal  Communication   Communication: No difficulties  Cognition Arousal/Alertness: Awake/alert Behavior During Therapy: WFL for tasks assessed/performed Overall Cognitive Status: Within Functional Limits for tasks assessed  General Comments General comments (skin integrity, edema, etc.): standing without the walker, but realized he needed it to step due to L LE pain    Exercises Total Joint Exercises Quad Sets: AROM;Left;5 reps;Supine Gluteal Sets: AROM;Both;5 reps;Supine Short Arc Quad: AROM;Left;10 reps;Supine Heel Slides: AAROM;Left;5 reps;Supine (using belt to assist) Hip  ABduction/ADduction: AAROM;Left;5 reps;Supine (using belt to assist)   Assessment/Plan    PT Assessment Patient needs continued PT services  PT Problem List Decreased strength;Decreased mobility;Decreased range of motion;Decreased knowledge of use of DME;Pain;Decreased safety awareness       PT Treatment Interventions DME instruction;Therapeutic activities;Gait training;Therapeutic exercise;Patient/family education;Balance training;Functional mobility training    PT Goals (Current goals can be found in the Care Plan section)  Acute Rehab PT Goals Patient Stated Goal: return home PT Goal Formulation: With patient Time For Goal Achievement: 06/21/21 Potential to Achieve Goals: Good    Frequency 7X/week   Barriers to discharge        Co-evaluation               AM-PAC PT "6 Clicks" Mobility  Outcome Measure Help needed turning from your back to your side while in a flat bed without using bedrails?: None Help needed moving from lying on your back to sitting on the side of a flat bed without using bedrails?: None Help needed moving to and from a bed to a chair (including a wheelchair)?: A Little Help needed standing up from a chair using your arms (e.g., wheelchair or bedside chair)?: A Little Help needed to walk in hospital room?: A Little Help needed climbing 3-5 steps with a railing? : A Little 6 Click Score: 20    End of Session   Activity Tolerance: Patient tolerated treatment well Patient left: in chair;with call bell/phone within reach   PT Visit Diagnosis: Other abnormalities of gait and mobility (R26.89)    Time: 8280-0349 PT Time Calculation (min) (ACUTE ONLY): 26 min   Charges:   PT Evaluation $PT Eval Low Complexity: 1 Low PT Treatments $Gait Training: 8-22 mins        Magda Kiel, PT Acute Rehabilitation Services Pager:[phone] [phone] 06/14/2021   William Mcfarland 06/14/2021, 10:02 AM

## 2021-06-14 NOTE — TOC Progression Note (Signed)
Transition of Care [facility]) - Progression Note    Patient Details  Name: William Mcfarland MRN: [id number] Date of Birth: 06-28-1956  Transition of Care [facility] CM/SW Contact  Milinda Antis, [place] Phone Number: 06/14/2021, 8:27 AM  Clinical Narrative:    LEFT TOTAL HIP ARTHROPLASTY ANTERIOR APPROACH on 06/13/2021   TOC following patient for any d/c planning needs once medically stable.  Lind Covert, MSW, LCSWA         Expected Discharge Plan and Services           Expected Discharge Date: 06/14/21                                     Social Determinants of Health (SDOH) Interventions    Readmission Risk Interventions No flowsheet data found.

## 2021-06-15 ENCOUNTER — Telehealth: Payer: Self-pay | Admitting: *Deleted

## 2021-06-15 NOTE — Telephone Encounter (Signed)
Ok, thanks.

## 2021-06-15 NOTE — Telephone Encounter (Signed)
This patient is not a bundle, but HHPT called about him. His BP was low today 80/60 and a little lethargic with their visit. He was taking pain medication and muscle relaxer together, so they instructed to stagger them some. Also, I told them to have him push fluids and eat something as well. They will let us know if anything else with further visits unless any other recommendations. Just an FYI.

## 2021-06-27 ENCOUNTER — Encounter: Payer: Self-pay | Admitting: Physician Assistant

## 2021-06-27 ENCOUNTER — Ambulatory Visit (INDEPENDENT_AMBULATORY_CARE_PROVIDER_SITE_OTHER): Payer: BC Managed Care – PPO | Admitting: Physician Assistant

## 2021-06-27 DIAGNOSIS — Z96642 Presence of left artificial hip joint: Secondary | ICD-10-CM

## 2021-06-27 MED ORDER — METHOCARBAMOL 500 MG PO TABS: 500.0000 mg | ORAL_TABLET | Freq: Two times a day (BID) | ORAL | 0 refills | Status: DC | PRN

## 2021-06-27 MED ORDER — HYDROCODONE-ACETAMINOPHEN 5-325 MG PO TABS: 1.0000 | ORAL_TABLET | Freq: Three times a day (TID) | ORAL | 0 refills | Status: DC | PRN

## 2021-06-27 NOTE — Progress Notes (Signed)
Post-Op Visit Note   Patient: William Mcfarland           Date of Birth: [birth year]           MRN: [id number] Visit Date: 06/27/2021 PCP: Kathyrn Lass, MD   Assessment & Plan:  Chief Complaint:  Chief Complaint  Patient presents with   Left Hip - Pain    Visit Diagnoses:  1. Status post total replacement of left hip     Plan: Patient is a pleasant 65 year old gentleman who comes in today 2 weeks out left total hip replacement, date of surgery 06/13/2021.  He has been doing well.  He is ambulating unassisted.  He denies any pain.  He did remove his bandage this past Monday while in shower.  Examination of his left hip reveals a well-healed surgical incision with nylon sutures in place.  No evidence of infection or cellulitis.  Calf is soft and nontender.  He is neurovascular intact distally.  Today, sutures were removed and Steri-Strips applied.  He will continue to advance with activity as tolerated.  Dental prophylaxis reinforced.  Follow-up with [place] in 4 weeks time for repeat evaluation and x-rays of the pelvis.  Call with concerns or questions in the meantime.  Follow-Up Instructions: Return in about 4 weeks (around 07/25/2021).   Orders:  No orders of the defined types were placed in this encounter.  No orders of the defined types were placed in this encounter.   Imaging: No new imaging  PMFS History: Patient Active Problem List   Diagnosis Date Noted   Status post total replacement of left hip 06/13/2021   Primary osteoarthritis of left hip 06/12/2021   Arthritis of left knee    S/P total knee arthroplasty, left 01/16/2021   Gout 07/03/2020   OA (osteoarthritis) of shoulder 10/05/2019   Healthcare maintenance 06/03/2019   Human immunodeficiency virus (HIV) disease ([place]) 05/03/2019   Type 2 diabetes mellitus with hyperglycemia, without long-term current use of insulin ([place]) 05/03/2019   Essential hypertension 05/03/2019   Class 1 obesity due to excess calories  with serious comorbidity and body mass index (BMI) of 32.0 to 32.9 in adult 05/03/2019   Degenerative arthritis of shoulder region 05/21/2016   Malignant neoplasm of prostate ([place]) 11/20/2014   Travel advice encounter 04/01/2014   Past Medical History:  Diagnosis Date   Arthritis    shoulders, knees, hips   At risk for sleep apnea    STOP-BANG= 5     SENT TO PCP 04-12-2015   History of colon polyps    History of radiation therapy 45 Gy plus seed implants on 04-13-2015   prostate external beam 02-20-2015 to 03-24-2015   HIV (human immunodeficiency virus infection) ([place])    dx in 2020.   Hyperlipidemia    Hypertension    Lower urinary tract symptoms (LUTS)    Prostate cancer [facility]) urologist-  dr ottelin/  oncologist- dr Tammi Klippel   T1c, Gleason 4+3,  PSA 6.83,  vol. 32.56cc--  s/p  external beam radiation 02-20-2015 to 03-24-2015   Type 2 diabetes mellitus ([place])    Type 2 diabetes, diet controlled ([place])    Wears glasses     Family History  Problem Relation Age of Onset   Stroke Father    Stroke Mother    Cancer Neg Hx     Past Surgical History:  Procedure Laterality Date   COLONOSCOPY W/ POLYPECTOMY  2013   EYE SURGERY     Cornea replacement left  eye (2018)   JOINT REPLACEMENT     ORIF LEFT LEG FX  1988   ORIF RIGHT ANKLE FX  1985   PROSTATE BIOPSY     RADIOACTIVE SEED IMPLANT N/A 04/14/2015   Procedure: RADIOACTIVE SEED IMPLANT;  Surgeon: Kathie Rhodes, MD;  Location: [facility];  Service: Urology;  Laterality: N/A;   64     seeds implanted no seeds found in bladder   TOTAL HIP ARTHROPLASTY Left 06/13/2021   Procedure: LEFT TOTAL HIP ARTHROPLASTY ANTERIOR APPROACH;  Surgeon: Leandrew Koyanagi, MD;  Location: [place];  Service: Orthopedics;  Laterality: Left;  3-C   TOTAL KNEE ARTHROPLASTY Left 01/16/2021   Procedure: LEFT TOTAL KNEE ARTHROPLASTY;  Surgeon: Meredith Pel, MD;  Location: [place];  Service: Orthopedics;  Laterality: Left;   TOTAL SHOULDER  ARTHROPLASTY Right 05/21/2016   Procedure: TOTAL SHOULDER ARTHROPLASTY;  Surgeon: Meredith Pel, MD;  Location: [place];  Service: Orthopedics;  Laterality: Right;   TOTAL SHOULDER ARTHROPLASTY Left 10/05/2019   Procedure: LEFT Total shoulder arthroplasty;  Surgeon: Meredith Pel, MD;  Location: [place];  Service: Orthopedics;  Laterality: Left;   Social History   Occupational History   Occupation: Air traffic controller  Tobacco Use   Smoking status: Former    Packs/day: 0.50    Years: 15.00    Pack years: 7.50    Types: Cigarettes    Quit date: 04/12/1999    Years since quitting: 22.2   Smokeless tobacco: Never  Vaping Use   Vaping Use: Never used  Substance and Sexual Activity   Alcohol use: Not Currently    Comment: occasional   Drug use: No   Sexual activity: Not on file

## 2021-07-25 ENCOUNTER — Encounter: Payer: Self-pay | Admitting: Orthopaedic Surgery

## 2021-07-25 ENCOUNTER — Ambulatory Visit (INDEPENDENT_AMBULATORY_CARE_PROVIDER_SITE_OTHER): Payer: BC Managed Care – PPO | Admitting: Orthopaedic Surgery

## 2021-07-25 ENCOUNTER — Other Ambulatory Visit: Payer: Self-pay

## 2021-07-25 ENCOUNTER — Ambulatory Visit (INDEPENDENT_AMBULATORY_CARE_PROVIDER_SITE_OTHER): Payer: BC Managed Care – PPO

## 2021-07-25 DIAGNOSIS — Z96642 Presence of left artificial hip joint: Secondary | ICD-10-CM | POA: Diagnosis not present

## 2021-07-25 NOTE — Progress Notes (Signed)
Post-Op Visit Note   Patient: William Mcfarland           Date of Birth: July 09, 1956           MRN: [id number] Visit Date: 07/25/2021 PCP: Kathyrn Lass, MD   Assessment & Plan:  Chief Complaint:  Chief Complaint  Patient presents with   Left Hip - Pain   Visit Diagnoses:  1. Status post total replacement of left hip     Plan: Patient is a pleasant 65 year old gentleman who comes in today 6 weeks status post left total hip replacement 06/13/2021.  He has been doing well.  He has been working on strengthening exercises, but is not quite there.  Examination of the left hip reveals painless logroll.  4 out of 5 strength with resisted hip flexion.  He is neurovascular intact distally.  At this point, we will he will continue to work on strengthening exercises.  He would like to return to work on 08/13/2021.  We will allow this point we will write restrictions of no climbing ladders or crawling until follow-up appointment which will be 12 weeks postop.  Follow-up with [place] in 6 weeks time for recheck.  Dental prophylaxis reinforced.  Call with concerns or questions.  Follow-Up Instructions: Return in about 6 weeks (around 09/05/2021).   Orders:  Orders Placed This Encounter  Procedures   XR Pelvis 1-2 Views   No orders of the defined types were placed in this encounter.   Imaging: XR Pelvis 1-2 Views  Result Date: 07/25/2021 Well-seated prosthesis without complication   PMFS History: Patient Active Problem List   Diagnosis Date Noted   Status post total replacement of left hip 06/13/2021   Primary osteoarthritis of left hip 06/12/2021   Arthritis of left knee    S/P total knee arthroplasty, left 01/16/2021   Gout 07/03/2020   OA (osteoarthritis) of shoulder 10/05/2019   Healthcare maintenance 06/03/2019   Human immunodeficiency virus (HIV) disease ([place]) 05/03/2019   Type 2 diabetes mellitus with hyperglycemia, without long-term current use of insulin ([place]) 05/03/2019    Essential hypertension 05/03/2019   Class 1 obesity due to excess calories with serious comorbidity and body mass index (BMI) of 32.0 to 32.9 in adult 05/03/2019   Degenerative arthritis of shoulder region 05/21/2016   Malignant neoplasm of prostate ([place]) 11/20/2014   Travel advice encounter 04/01/2014   Past Medical History:  Diagnosis Date   Arthritis    shoulders, knees, hips   At risk for sleep apnea    STOP-BANG= 5     SENT TO PCP 04-12-2015   History of colon polyps    History of radiation therapy 45 Gy plus seed implants on 04-13-2015   prostate external beam 02-20-2015 to 03-24-2015   HIV (human immunodeficiency virus infection) ([place])    dx in 2020.   Hyperlipidemia    Hypertension    Lower urinary tract symptoms (LUTS)    Prostate cancer [facility]) urologist-  dr ottelin/  oncologist- dr Tammi Klippel   T1c, Gleason 4+3,  PSA 6.83,  vol. 32.56cc--  s/p  external beam radiation 02-20-2015 to 03-24-2015   Type 2 diabetes mellitus ([place])    Type 2 diabetes, diet controlled ([place])    Wears glasses     Family History  Problem Relation Age of Onset   Stroke Father    Stroke Mother    Cancer Neg Hx     Past Surgical History:  Procedure Laterality Date   COLONOSCOPY W/ POLYPECTOMY  2013   EYE SURGERY     Cornea replacement left eye (2018)   JOINT REPLACEMENT     ORIF LEFT LEG FX  1988   ORIF RIGHT ANKLE FX  1985   PROSTATE BIOPSY     RADIOACTIVE SEED IMPLANT N/A 04/14/2015   Procedure: RADIOACTIVE SEED IMPLANT;  Surgeon: Kathie Rhodes, MD;  Location: [facility];  Service: Urology;  Laterality: N/A;   89     seeds implanted no seeds found in bladder   TOTAL HIP ARTHROPLASTY Left 06/13/2021   Procedure: LEFT TOTAL HIP ARTHROPLASTY ANTERIOR APPROACH;  Surgeon: Leandrew Koyanagi, MD;  Location: [place];  Service: Orthopedics;  Laterality: Left;  3-C   TOTAL KNEE ARTHROPLASTY Left 01/16/2021   Procedure: LEFT TOTAL KNEE ARTHROPLASTY;  Surgeon: Meredith Pel, MD;  Location: [place];  Service: Orthopedics;  Laterality: Left;   TOTAL SHOULDER ARTHROPLASTY Right 05/21/2016   Procedure: TOTAL SHOULDER ARTHROPLASTY;  Surgeon: Meredith Pel, MD;  Location: [place];  Service: Orthopedics;  Laterality: Right;   TOTAL SHOULDER ARTHROPLASTY Left 10/05/2019   Procedure: LEFT Total shoulder arthroplasty;  Surgeon: Meredith Pel, MD;  Location: [place];  Service: Orthopedics;  Laterality: Left;   Social History   Occupational History   Occupation: Air traffic controller  Tobacco Use   Smoking status: Former    Packs/day: 0.50    Years: 15.00    Pack years: 7.50    Types: Cigarettes    Quit date: 04/12/1999    Years since quitting: 22.3   Smokeless tobacco: Never  Vaping Use   Vaping Use: Never used  Substance and Sexual Activity   Alcohol use: Not Currently    Comment: occasional   Drug use: No   Sexual activity: Not on file

## 2021-07-31 IMAGING — CR DG CHEST 2V
2 series · 2 of 2 positions shown · non-contrast
Comparison: 05/10/2016

CLINICAL DATA: 64-year-old male with a history pending surgery

EXAM:
CHEST - 2 VIEW

[w chest pa]
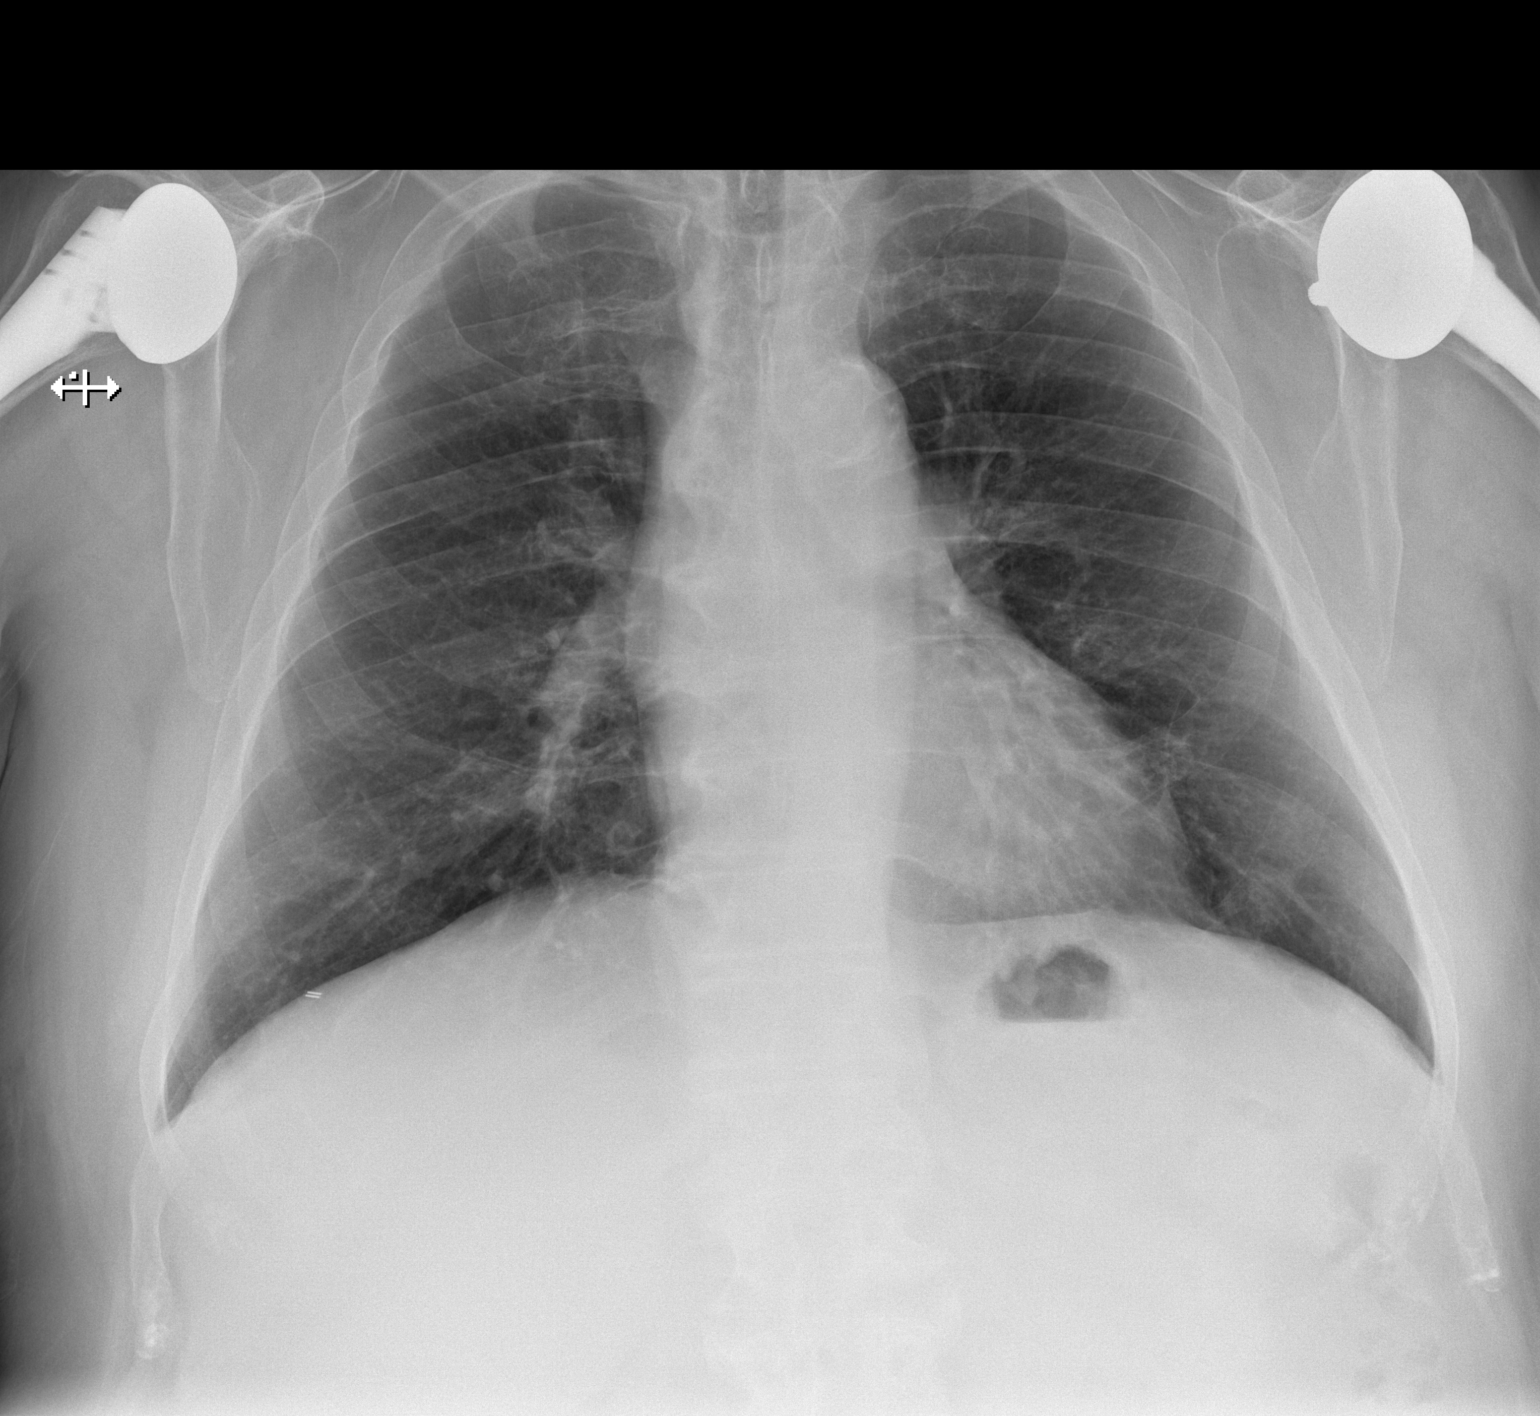

[w chest lat]
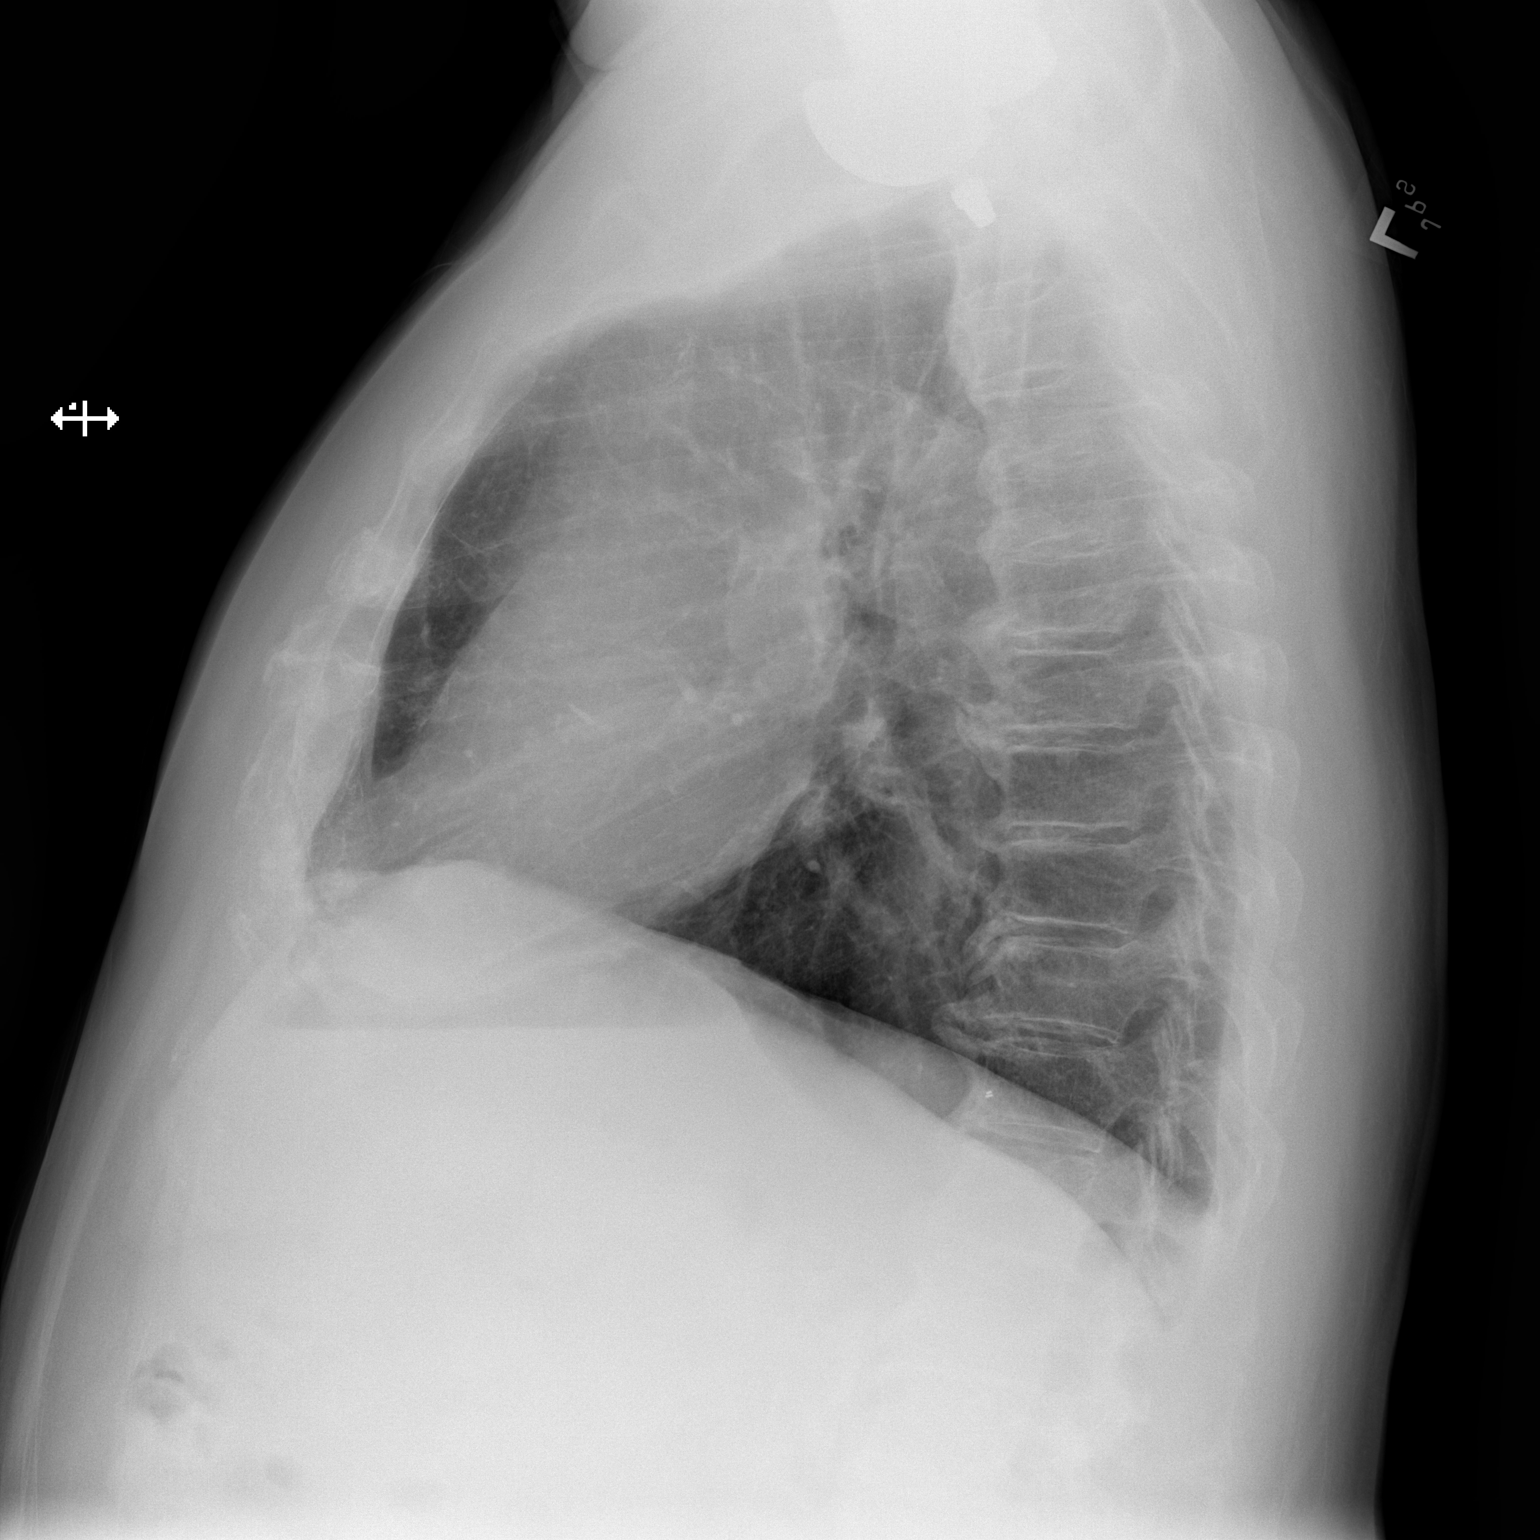

[2 of 2 positions shown; findings below may reference images not displayed]

FINDINGS: Cardiomediastinal silhouette unchanged in size and contour. No
evidence of central vascular congestion. No pneumothorax or pleural
effusion. No confluent airspace disease. Coarsened interstitial
markings are similar to the comparison. Interval surgical changes of
the bilateral glenohumeral joint.

Degenerative changes of the spine.  No acute displaced fracture
IMPRESSION: Chronic lung changes without evidence of acute cardiopulmonary
disease

## 2021-09-04 ENCOUNTER — Ambulatory Visit (INDEPENDENT_AMBULATORY_CARE_PROVIDER_SITE_OTHER): Payer: BC Managed Care – PPO | Admitting: Physician Assistant

## 2021-09-04 ENCOUNTER — Encounter: Payer: Self-pay | Admitting: Orthopaedic Surgery

## 2021-09-04 ENCOUNTER — Other Ambulatory Visit: Payer: Self-pay

## 2021-09-04 DIAGNOSIS — Z96642 Presence of left artificial hip joint: Secondary | ICD-10-CM

## 2021-09-04 MED ORDER — METHOCARBAMOL 500 MG PO TABS: 500.0000 mg | ORAL_TABLET | Freq: Two times a day (BID) | ORAL | 0 refills | Status: DC | PRN

## 2021-09-04 NOTE — Progress Notes (Signed)
Post-Op Visit Note   Patient: William Mcfarland           Date of Birth: 13-Jul-1956           MRN: [id number] Visit Date: 09/04/2021 PCP: Kathyrn Lass, MD   Assessment & Plan:  Chief Complaint:  Chief Complaint  Patient presents with   Left Hip - Follow-up   Visit Diagnoses:  1. History of total hip replacement, left     Plan: Patient is a pleasant 65 year old gentleman who comes in today 3 months out left total hip replacement 06/13/2021.  He has been doing well.  He still notes a slight twinge to the anterior thigh but this has become less frequent.  He is taking Robaxin which does seem to help.  He continues to work on strengthening exercises as he still has slight weakness with hip flexion.  Examination of his left hip reveals painless logroll.  4 out of 5 strength with hip flexion.  He is neurovascular intact distally.  At this point, have agreed to refill his Robaxin.  He will continue to work on strengthening exercises.  Dental prophylaxis reinforced.  Follow-up with [place] in 3 months time for repeat evaluation and AP pelvis x-rays.  Call with concerns or questions.  Follow-Up Instructions: Return in about 3 months (around 12/04/2021).   Orders:  No orders of the defined types were placed in this encounter.  No orders of the defined types were placed in this encounter.   Imaging: No new imaging  PMFS History: Patient Active Problem List   Diagnosis Date Noted   Status post total replacement of left hip 06/13/2021   Primary osteoarthritis of left hip 06/12/2021   Arthritis of left knee    S/P total knee arthroplasty, left 01/16/2021   Gout 07/03/2020   OA (osteoarthritis) of shoulder 10/05/2019   Healthcare maintenance 06/03/2019   Human immunodeficiency virus (HIV) disease ([place]) 05/03/2019   Type 2 diabetes mellitus with hyperglycemia, without long-term current use of insulin ([place]) 05/03/2019   Essential hypertension 05/03/2019   Class 1 obesity due to excess  calories with serious comorbidity and body mass index (BMI) of 32.0 to 32.9 in adult 05/03/2019   Degenerative arthritis of shoulder region 05/21/2016   Malignant neoplasm of prostate ([place]) 11/20/2014   Travel advice encounter 04/01/2014   Past Medical History:  Diagnosis Date   Arthritis    shoulders, knees, hips   At risk for sleep apnea    STOP-BANG= 5     SENT TO PCP 04-12-2015   History of colon polyps    History of radiation therapy 45 Gy plus seed implants on 04-13-2015   prostate external beam 02-20-2015 to 03-24-2015   HIV (human immunodeficiency virus infection) ([place])    dx in 2020.   Hyperlipidemia    Hypertension    Lower urinary tract symptoms (LUTS)    Prostate cancer [facility]) urologist-  dr ottelin/  oncologist- dr Tammi Klippel   T1c, Gleason 4+3,  PSA 6.83,  vol. 32.56cc--  s/p  external beam radiation 02-20-2015 to 03-24-2015   Type 2 diabetes mellitus ([place])    Type 2 diabetes, diet controlled ([place])    Wears glasses     Family History  Problem Relation Age of Onset   Stroke Father    Stroke Mother    Cancer Neg Hx     Past Surgical History:  Procedure Laterality Date   COLONOSCOPY W/ POLYPECTOMY  2013   EYE SURGERY  Cornea replacement left eye (2018)   JOINT REPLACEMENT     ORIF LEFT LEG FX  1988   ORIF RIGHT ANKLE FX  1985   PROSTATE BIOPSY     RADIOACTIVE SEED IMPLANT N/A 04/14/2015   Procedure: RADIOACTIVE SEED IMPLANT;  Surgeon: Kathie Rhodes, MD;  Location: [facility];  Service: Urology;  Laterality: N/A;   49     seeds implanted no seeds found in bladder   TOTAL HIP ARTHROPLASTY Left 06/13/2021   Procedure: LEFT TOTAL HIP ARTHROPLASTY ANTERIOR APPROACH;  Surgeon: Leandrew Koyanagi, MD;  Location: [place];  Service: Orthopedics;  Laterality: Left;  3-C   TOTAL KNEE ARTHROPLASTY Left 01/16/2021   Procedure: LEFT TOTAL KNEE ARTHROPLASTY;  Surgeon: Meredith Pel, MD;  Location: [place];  Service: Orthopedics;  Laterality: Left;   TOTAL SHOULDER  ARTHROPLASTY Right 05/21/2016   Procedure: TOTAL SHOULDER ARTHROPLASTY;  Surgeon: Meredith Pel, MD;  Location: [place];  Service: Orthopedics;  Laterality: Right;   TOTAL SHOULDER ARTHROPLASTY Left 10/05/2019   Procedure: LEFT Total shoulder arthroplasty;  Surgeon: Meredith Pel, MD;  Location: [place];  Service: Orthopedics;  Laterality: Left;   Social History   Occupational History   Occupation: Air traffic controller  Tobacco Use   Smoking status: Former    Packs/day: 0.50    Years: 15.00    Pack years: 7.50    Types: Cigarettes    Quit date: 04/12/1999    Years since quitting: 22.4   Smokeless tobacco: Never  Vaping Use   Vaping Use: Never used  Substance and Sexual Activity   Alcohol use: Not Currently    Comment: occasional   Drug use: No   Sexual activity: Not on file

## 2021-09-06 ENCOUNTER — Other Ambulatory Visit: Payer: Self-pay | Admitting: Family

## 2021-09-06 ENCOUNTER — Other Ambulatory Visit: Payer: BC Managed Care – PPO

## 2021-09-06 DIAGNOSIS — B20 Human immunodeficiency virus [HIV] disease: Secondary | ICD-10-CM

## 2021-09-10 ENCOUNTER — Other Ambulatory Visit: Payer: BC Managed Care – PPO

## 2021-09-10 ENCOUNTER — Other Ambulatory Visit: Payer: Self-pay

## 2021-09-10 DIAGNOSIS — B20 Human immunodeficiency virus [HIV] disease: Secondary | ICD-10-CM

## 2021-09-11 LAB — T-HELPER CELL (CD4) - (RCID CLINIC ONLY)
CD4 % Helper T Cell: 45 % (ref 33–65)
CD4 T Cell Abs: 1113 /uL (ref 400–1790)

## 2021-09-12 LAB — COMPREHENSIVE METABOLIC PANEL
AG Ratio: 1.7 (calc) (ref 1.0–2.5)
ALT: 14 U/L (ref 9–46)
AST: 12 U/L (ref 10–35)
Albumin: 4.5 g/dL (ref 3.6–5.1)
Alkaline phosphatase (APISO): 75 U/L (ref 35–144)
BUN: 18 mg/dL (ref 7–25)
CO2: 26 mmol/L (ref 20–32)
Calcium: 10.3 mg/dL (ref 8.6–10.3)
Chloride: 100 mmol/L (ref 98–110)
Creat: 0.74 mg/dL (ref 0.70–1.35)
Globulin: 2.6 g/dL (calc) (ref 1.9–3.7)
Glucose, Bld: 123 mg/dL — ABNORMAL HIGH (ref 65–99)
Potassium: 4.2 mmol/L (ref 3.5–5.3)
Sodium: 139 mmol/L (ref 135–146)
Total Bilirubin: 0.3 mg/dL (ref 0.2–1.2)
Total Protein: 7.1 g/dL (ref 6.1–8.1)

## 2021-09-12 LAB — HIV-1 RNA QUANT-NO REFLEX-BLD
HIV 1 RNA Quant: NOT DETECTED Copies/mL
HIV-1 RNA Quant, Log: NOT DETECTED Log cps/mL

## 2021-09-20 ENCOUNTER — Encounter: Payer: Self-pay | Admitting: Family

## 2021-09-20 ENCOUNTER — Other Ambulatory Visit: Payer: Self-pay

## 2021-09-20 ENCOUNTER — Ambulatory Visit (INDEPENDENT_AMBULATORY_CARE_PROVIDER_SITE_OTHER): Payer: BC Managed Care – PPO

## 2021-09-20 ENCOUNTER — Ambulatory Visit (INDEPENDENT_AMBULATORY_CARE_PROVIDER_SITE_OTHER): Payer: BC Managed Care – PPO | Admitting: Family

## 2021-09-20 VITALS — BP 121/77 | HR 92 | Wt 209.0 lb

## 2021-09-20 DIAGNOSIS — Z23 Encounter for immunization: Secondary | ICD-10-CM | POA: Diagnosis not present

## 2021-09-20 DIAGNOSIS — Z79899 Other long term (current) drug therapy: Secondary | ICD-10-CM | POA: Diagnosis not present

## 2021-09-20 DIAGNOSIS — B20 Human immunodeficiency virus [HIV] disease: Secondary | ICD-10-CM

## 2021-09-20 DIAGNOSIS — Z0000 Encounter for general adult medical examination without abnormal findings: Secondary | ICD-10-CM | POA: Diagnosis not present

## 2021-09-20 DIAGNOSIS — Z113 Encounter for screening for infections with a predominantly sexual mode of transmission: Secondary | ICD-10-CM

## 2021-09-20 MED ORDER — BIKTARVY 50-200-25 MG PO TABS: ORAL_TABLET | ORAL | 5 refills | Status: DC

## 2021-09-20 MED ORDER — MUPIROCIN CALCIUM 2 % EX CREA: 1.0000 "application " | TOPICAL_CREAM | Freq: Two times a day (BID) | CUTANEOUS | 1 refills | Status: DC

## 2021-09-20 NOTE — Assessment & Plan Note (Signed)
Mr. Nolden continues to have well-controlled virus with good adherence and tolerance to his ART regimen of Biktarvy.  No signs/symptoms of opportunistic infection.  Reviewed lab work and discussed plan of care.  Continue current dose of Biktarvy.  Plan for follow-up in 6 months or sooner if needed with lab work 1 to 2 weeks prior to appointment.

## 2021-09-20 NOTE — Progress Notes (Signed)
Brief Narrative   Patient ID: William Mcfarland, male    DOB: 03-13-1956, 65 y.o.   MRN: [id number]  Mr. Fodor is a white male initially diagnosed with HIV on 03/20/19 with initial viral load of 1.24 million and CD4 count of 690 and risk factor of MSM. [id number] negative. Genotype was without significant mutations. Entered care at [facility] Stage 1.  No history of opportunistic infection. [id number] negative. Sole medication regimen of Biktarvy.   Subjective:    Chief Complaint  Patient presents with   Follow-up    Reports depression; no thoughts of hurting self or others; sister committed suicide in July; joint replacement in February and June (knee and hip); offered flu shot and bivalent    HPI:  William Mcfarland is a 65 y.o. male with HIV disease last seen on 03/06/2021 with well-controlled virus and good adherence and tolerance to his ART regimen of Biktarvy.  Viral load was undetectable with CD4 count of 1071.  Most recent blood work completed on 01/10/2021 with viral load that remains undetectable and CD4 count of 1113.  Kidney function, liver function, electrolytes within normal ranges.  Here today for routine follow-up.  Mr. Closs continues to take his Biktarvy daily as prescribed with no adverse side effects.  Overall feeling well today although having some feelings of being down/depressed secondary to multiple life stressors.  No thoughts of suicidal ideations. Denies fevers, chills, night sweats, headaches, changes in vision, neck pain/stiffness, nausea, diarrhea, vomiting, lesions or rashes.  Mr. Atienza has no problems obtaining medication from pharmacy remains covered through [facility].  No current recreational or illicit drug use, alcohol consumption, or tobacco use.  Routine dental care up-to-date per recommendations.  Healthcare maintenance due includes COVID and influenza vaccinations.  Condoms offered..  No Known Allergies    Outpatient Medications Prior to  Visit  Medication Sig Dispense Refill   BIKTARVY 50-200-25 MG TABS tablet TAKE 1 TABLET BY MOUTH 1 TIME A DAY. 30 tablet 0   aspirin EC 81 MG tablet Take 1 tablet (81 mg total) by mouth 2 (two) times daily. To be taken after surgery 84 tablet 0   atorvastatin (LIPITOR) 10 MG tablet Take 10 mg by mouth in the morning.     celecoxib (CELEBREX) 100 MG capsule TAKE 1 CAPSULE(100 MG) BY MOUTH TWICE DAILY (Patient taking differently: Take 100-300 mg by mouth daily as needed for moderate pain.) 60 capsule 2   docusate sodium (COLACE) 100 MG capsule Take 1 capsule (100 mg total) by mouth daily as needed. 30 capsule 2   gabapentin (NEURONTIN) 300 MG capsule TAKE 1 CAPSULE(300 MG) BY MOUTH TWICE DAILY FOR 7 DAYS THEN TAKE 1 CAPSULE(300 MG) BY MOUTH DAILY FOR 7 DAYS 21 capsule 0   HYDROcodone-acetaminophen (NORCO) 5-325 MG tablet Take 1-2 tablets by mouth 3 (three) times daily as needed. 30 tablet 0   lisinopril-hydrochlorothiazide (ZESTORETIC) 20-12.5 MG tablet Take 1 tablet by mouth 2 (two) times a day.     metFORMIN (GLUCOPHAGE) 500 MG tablet Take 1,000 mg by mouth 2 (two) times daily with a meal.     methocarbamol (ROBAXIN) 500 MG tablet Take 1 tablet (500 mg total) by mouth 2 (two) times daily as needed. 20 tablet 0   ondansetron (ZOFRAN) 4 MG tablet Take 1 tablet (4 mg total) by mouth every 8 (eight) hours as needed for nausea or vomiting. 40 tablet 0   oxyCODONE-acetaminophen (PERCOCET) 5-325 MG tablet Take 1-2 tablets by mouth every  6 (six) hours as needed. To be taken after surgery 40 tablet 0   sulfamethoxazole-trimethoprim (BACTRIM DS) 800-160 MG tablet Take 1 tablet by mouth 2 (two) times daily. To be taken after surgery 20 tablet 0   No facility-administered medications prior to visit.     Past Medical History:  Diagnosis Date   Arthritis    shoulders, knees, hips   At risk for sleep apnea    STOP-BANG= 5     SENT TO PCP 04-12-2015   History of colon polyps    History of radiation  therapy 45 Gy plus seed implants on 04-13-2015   prostate external beam 02-20-2015 to 03-24-2015   HIV (human immunodeficiency virus infection) ([place])    dx in 2020.   Hyperlipidemia    Hypertension    Lower urinary tract symptoms (LUTS)    Prostate cancer [facility]) urologist-  dr ottelin/  oncologist- dr Tammi Klippel   T1c, Gleason 4+3,  PSA 6.83,  vol. 32.56cc--  s/p  external beam radiation 02-20-2015 to 03-24-2015   Type 2 diabetes mellitus ([place])    Type 2 diabetes, diet controlled ([employer])    Wears glasses      Past Surgical History:  Procedure Laterality Date   COLONOSCOPY W/ POLYPECTOMY  2013   EYE SURGERY     Cornea replacement left eye (2018)   JOINT REPLACEMENT     ORIF LEFT LEG FX  1988   ORIF RIGHT ANKLE FX  1985   PROSTATE BIOPSY     RADIOACTIVE SEED IMPLANT N/A 04/14/2015   Procedure: RADIOACTIVE SEED IMPLANT;  Surgeon: Kathie Rhodes, MD;  Location: [place];  Service: Urology;  Laterality: N/A;   41     seeds implanted no seeds found in bladder   TOTAL HIP ARTHROPLASTY Left 06/13/2021   Procedure: LEFT TOTAL HIP ARTHROPLASTY ANTERIOR APPROACH;  Surgeon: Leandrew Koyanagi, MD;  Location: [place];  Service: Orthopedics;  Laterality: Left;  3-C   TOTAL KNEE ARTHROPLASTY Left 01/16/2021   Procedure: LEFT TOTAL KNEE ARTHROPLASTY;  Surgeon: Meredith Pel, MD;  Location: [place];  Service: Orthopedics;  Laterality: Left;   TOTAL SHOULDER ARTHROPLASTY Right 05/21/2016   Procedure: TOTAL SHOULDER ARTHROPLASTY;  Surgeon: Meredith Pel, MD;  Location: [place];  Service: Orthopedics;  Laterality: Right;   TOTAL SHOULDER ARTHROPLASTY Left 10/05/2019   Procedure: LEFT Total shoulder arthroplasty;  Surgeon: Meredith Pel, MD;  Location: [place];  Service: Orthopedics;  Laterality: Left;      Review of Systems  Constitutional:  Negative for appetite change, chills, fatigue, fever and unexpected weight change.  Eyes:  Negative for visual disturbance.  Respiratory:  Negative  for cough, chest tightness, shortness of breath and wheezing.   Cardiovascular:  Negative for chest pain and leg swelling.  Gastrointestinal:  Negative for abdominal pain, constipation, diarrhea, nausea and vomiting.  Genitourinary:  Negative for dysuria, flank pain, frequency, genital sores, hematuria and urgency.  Skin:  Negative for rash.  Allergic/Immunologic: Negative for immunocompromised state.  Neurological:  Negative for dizziness and headaches.     Objective:    BP 121/77   Pulse 92   Wt 209 lb (94.8 kg)   BMI 33.73 kg/m  Nursing note and vital signs reviewed.  Physical Exam Constitutional:      General: He is not in acute distress.    Appearance: He is well-developed.  Eyes:     Conjunctiva/sclera: Conjunctivae normal.  Cardiovascular:     Rate and Rhythm: Normal  rate and regular rhythm.     Heart sounds: Normal heart sounds. No murmur heard.   No friction rub. No gallop.  Pulmonary:     Effort: Pulmonary effort is normal. No respiratory distress.     Breath sounds: Normal breath sounds. No wheezing or rales.  Chest:     Chest wall: No tenderness.  Abdominal:     General: Bowel sounds are normal.     Palpations: Abdomen is soft.     Tenderness: There is no abdominal tenderness.  Musculoskeletal:     Cervical back: Neck supple.  Lymphadenopathy:     Cervical: No cervical adenopathy.  Skin:    General: Skin is warm and dry.     Findings: No rash.  Neurological:     Mental Status: He is alert and oriented to person, place, and time.  Psychiatric:        Behavior: Behavior normal.        Thought Content: Thought content normal.        Judgment: Judgment normal.     Depression screen [facility] 2/9 09/20/2021 03/06/2021 10/17/2020 07/03/2020 10/18/2019  Decreased Interest 1 0 0 0 0  Down, Depressed, Hopeless 1 0 1 0 0  PHQ - 2 Score 2 0 1 0 0  Altered sleeping 0 - - - -  Tired, decreased energy 0 - - - -  Change in appetite 0 - - - -  Feeling bad or failure about  yourself  1 - - - -  Trouble concentrating 1 - - - -  Moving slowly or fidgety/restless 0 - - - -  Suicidal thoughts 0 - - - -  PHQ-9 Score 4 - - - -       Assessment & Plan:    Patient Active Problem List   Diagnosis Date Noted   Status post total replacement of left hip 06/13/2021   Primary osteoarthritis of left hip 06/12/2021   Arthritis of left knee    S/P total knee arthroplasty, left 01/16/2021   Gout 07/03/2020   OA (osteoarthritis) of shoulder 10/05/2019   Healthcare maintenance 06/03/2019   Human immunodeficiency virus (HIV) disease ([place]) 05/03/2019   Type 2 diabetes mellitus with hyperglycemia, without long-term current use of insulin ([place]) 05/03/2019   Essential hypertension 05/03/2019   Class 1 obesity due to excess calories with serious comorbidity and body mass index (BMI) of 32.0 to 32.9 in adult 05/03/2019   Degenerative arthritis of shoulder region 05/21/2016   Malignant neoplasm of prostate ([place]) 11/20/2014   Travel advice encounter 04/01/2014     Problem List Items Addressed This Visit       Other   Human immunodeficiency virus (HIV) disease ([place]) - Primary    Mr. Dillenburg continues to have well-controlled virus with good adherence and tolerance to his ART regimen of Biktarvy.  No signs/symptoms of opportunistic infection.  Reviewed lab work and discussed plan of care.  Continue current dose of Biktarvy.  Plan for follow-up in 6 months or sooner if needed with lab work 1 to 2 weeks prior to appointment.      Relevant Medications   bictegravir-emtricitabine-tenofovir AF (BIKTARVY) 50-200-25 MG TABS tablet   mupirocin cream (BACTROBAN) 2 %   Healthcare maintenance    Influenza and COVID vaccines updated today. Discussed importance of safe sexual practices and condom usage.  Condoms offered. Routine dental care is up-to-date per recommendation.      Other Visit Diagnoses     Need for immunization against  influenza       Relevant Orders   Flu Vaccine  QUAD 38mo+IM (Fluarix, Fluzone & Alfiuria Quad PF) (Completed)   Pharmacologic therapy       Screening for STDs (sexually transmitted diseases)            I have changed [place]'s Biktarvy. I am also having him start on mupirocin cream. Additionally, I am having him maintain his lisinopril-hydrochlorothiazide, atorvastatin, metFORMIN, gabapentin, sulfamethoxazole-trimethoprim, oxyCODONE-acetaminophen, ondansetron, aspirin EC, docusate sodium, celecoxib, HYDROcodone-acetaminophen, and methocarbamol.   Meds ordered this encounter  Medications   bictegravir-emtricitabine-tenofovir AF (BIKTARVY) 50-200-25 MG TABS tablet    Sig: TAKE 1 TABLET BY MOUTH 1 TIME A DAY.    Dispense:  30 tablet    Refill:  5    Order Specific Question:   Supervising Provider    Answer:   Baxter Flattery, CYNTHIA [[id number]]   mupirocin cream (BACTROBAN) 2 %    Sig: Apply 1 application topically 2 (two) times daily.    Dispense:  15 g    Refill:  1    Order Specific Question:   Supervising Provider    Answer:   Carlyle Basques [[id number]]     Follow-up: Return in about 6 months (around 03/21/2022), or if symptoms worsen or fail to improve.   Terri Piedra, MSN, FNP-C Nurse Practitioner [facility] for Infectious Disease [place] number: [phone]

## 2021-09-20 NOTE — Patient Instructions (Addendum)
Nice to see you.  Continue to take your medication daily as prescribed.  Refills have been sent to the pharmacy.  Plan for follow up in 6 months or sooner if needed with lab work 1-2 weeks prior to appointment.   Have a great day and stay safe!  

## 2021-09-20 NOTE — Assessment & Plan Note (Signed)
   Influenza and COVID vaccines updated today.  Discussed importance of safe sexual practices and condom usage.  Condoms offered.  Routine dental care is up-to-date per recommendation.

## 2021-09-20 NOTE — Progress Notes (Signed)
   Covid-19 Vaccination Clinic  Name:  William Mcfarland    MRN: [id number] DOB: 09-17-1956  09/20/2021  Mr. William Mcfarland was observed post Covid-19 immunization for 15 minutes without incident. He was provided with Vaccine Information Sheet and instruction to access the V-Safe system.   Mr. William Mcfarland was instructed to call 911 with any severe reactions post vaccine: Difficulty breathing  Swelling of face and throat  A fast heartbeat  A bad rash all over body  Dizziness and weakness     Carlean Purl, RN

## 2021-09-21 ENCOUNTER — Telehealth: Payer: Self-pay

## 2021-09-21 MED ORDER — MUPIROCIN 2 % EX OINT: 1.0000 "application " | TOPICAL_OINTMENT | Freq: Two times a day (BID) | CUTANEOUS | 0 refills | Status: DC

## 2021-09-21 NOTE — Addendum Note (Signed)
Addended by: Mauricio Po D on: 09/21/2021 11:49 AM   Modules accepted: Orders

## 2021-09-21 NOTE — Telephone Encounter (Signed)
Received notification from pharmacy that mupirocin cream is not covered by patient's plan, will need to be switched to ointment. Will route to provider.   Beryle Flock, RN

## 2021-09-21 NOTE — Telephone Encounter (Signed)
Alternative sent

## 2021-12-04 ENCOUNTER — Other Ambulatory Visit: Payer: Self-pay

## 2021-12-04 ENCOUNTER — Ambulatory Visit: Payer: Self-pay

## 2021-12-04 ENCOUNTER — Ambulatory Visit (INDEPENDENT_AMBULATORY_CARE_PROVIDER_SITE_OTHER): Payer: BC Managed Care – PPO | Admitting: Orthopaedic Surgery

## 2021-12-04 DIAGNOSIS — Z96642 Presence of left artificial hip joint: Secondary | ICD-10-CM

## 2021-12-04 DIAGNOSIS — Z96652 Presence of left artificial knee joint: Secondary | ICD-10-CM | POA: Diagnosis not present

## 2021-12-04 NOTE — Progress Notes (Signed)
Post-Op Visit Note   Patient: William Mcfarland           Date of Birth: Mar 31, 1956           MRN: [id number] Visit Date: 12/04/2021 PCP: Kathyrn Lass, MD   Assessment & Plan:  Chief Complaint:  Chief Complaint  Patient presents with   Left Hip - Follow-up   Visit Diagnoses:  1. History of total hip replacement, left   2. History of left knee replacement     Plan: Patient is a pleasant 65 year old gentleman who comes in today 23-month status post left total hip replacement 06/13/2021.  He has been doing well.  He does note slight discomfort to the top of his thigh with stair climbing, but no other complaints.  Overall doing very well.  Examination of his left hip reveals painless logroll.  He is neurovascular intact distally.  At this point, he may continue to advance with activity as tolerated.  He will start working on his exercise program as I feel as though this will help with the discomfort he is experiencing.  Dental prophylaxis reinforced.  Follow-up with [place] in 6 months time for repeat evaluation and AP pelvis x-rays.  Call with concerns or questions.  Follow-Up Instructions: Return in about 6 months (around 06/04/2022).   Orders:  Orders Placed This Encounter  Procedures   XR HIP UNILAT W OR W/O PELVIS 1V LEFT   XR Knee 1-2 Views Left   No orders of the defined types were placed in this encounter.   Imaging: XR HIP UNILAT W OR W/O PELVIS 1V LEFT  Result Date: 12/04/2021 Well-seated prosthesis without complication  XR Knee 1-2 Views Left  Result Date: 12/04/2021 Well-seated prosthesis without complication   PMFS History: Patient Active Problem List   Diagnosis Date Noted   Status post total replacement of left hip 06/13/2021   Primary osteoarthritis of left hip 06/12/2021   Arthritis of left knee    S/P total knee arthroplasty, left 01/16/2021   Gout 07/03/2020   OA (osteoarthritis) of shoulder 10/05/2019   Healthcare maintenance 06/03/2019   Human  immunodeficiency virus (HIV) disease ([place]) 05/03/2019   Type 2 diabetes mellitus with hyperglycemia, without long-term current use of insulin ([place]) 05/03/2019   Essential hypertension 05/03/2019   Class 1 obesity due to excess calories with serious comorbidity and body mass index (BMI) of 32.0 to 32.9 in adult 05/03/2019   Degenerative arthritis of shoulder region 05/21/2016   Malignant neoplasm of prostate ([place]) 11/20/2014   Travel advice encounter 04/01/2014   Past Medical History:  Diagnosis Date   Arthritis    shoulders, knees, hips   At risk for sleep apnea    STOP-BANG= 5     SENT TO PCP 04-12-2015   History of colon polyps    History of radiation therapy 45 Gy plus seed implants on 04-13-2015   prostate external beam 02-20-2015 to 03-24-2015   HIV (human immunodeficiency virus infection) ([place])    dx in 2020.   Hyperlipidemia    Hypertension    Lower urinary tract symptoms (LUTS)    Prostate cancer [facility]) urologist-  dr ottelin/  oncologist- dr Tammi Klippel   T1c, Gleason 4+3,  PSA 6.83,  vol. 32.56cc--  s/p  external beam radiation 02-20-2015 to 03-24-2015   Type 2 diabetes mellitus ([facility])    Type 2 diabetes, diet controlled ([place])    Wears glasses     Family History  Problem Relation Age of Onset  Stroke Father    Stroke Mother    Cancer Neg Hx     Past Surgical History:  Procedure Laterality Date   COLONOSCOPY W/ POLYPECTOMY  2013   EYE SURGERY     Cornea replacement left eye (2018)   JOINT REPLACEMENT     ORIF LEFT LEG FX  1988   ORIF RIGHT ANKLE FX  1985   PROSTATE BIOPSY     RADIOACTIVE SEED IMPLANT N/A 04/14/2015   Procedure: RADIOACTIVE SEED IMPLANT;  Surgeon: Kathie Rhodes, MD;  Location: [place];  Service: Urology;  Laterality: N/A;   71     seeds implanted no seeds found in bladder   TOTAL HIP ARTHROPLASTY Left 06/13/2021   Procedure: LEFT TOTAL HIP ARTHROPLASTY ANTERIOR APPROACH;  Surgeon: Leandrew Koyanagi, MD;  Location: [place];  Service:  Orthopedics;  Laterality: Left;  3-C   TOTAL KNEE ARTHROPLASTY Left 01/16/2021   Procedure: LEFT TOTAL KNEE ARTHROPLASTY;  Surgeon: Meredith Pel, MD;  Location: [place];  Service: Orthopedics;  Laterality: Left;   TOTAL SHOULDER ARTHROPLASTY Right 05/21/2016   Procedure: TOTAL SHOULDER ARTHROPLASTY;  Surgeon: Meredith Pel, MD;  Location: [place];  Service: Orthopedics;  Laterality: Right;   TOTAL SHOULDER ARTHROPLASTY Left 10/05/2019   Procedure: LEFT Total shoulder arthroplasty;  Surgeon: Meredith Pel, MD;  Location: [place];  Service: Orthopedics;  Laterality: Left;   Social History   Occupational History   Occupation: Air traffic controller  Tobacco Use   Smoking status: Former    Packs/day: 0.50    Years: 15.00    Pack years: 7.50    Types: Cigarettes    Quit date: 04/12/1999    Years since quitting: 22.6   Smokeless tobacco: Never  Vaping Use   Vaping Use: Never used  Substance and Sexual Activity   Alcohol use: Not Currently    Comment: occasional   Drug use: No   Sexual activity: Not on file

## 2022-03-11 ENCOUNTER — Other Ambulatory Visit: Payer: Self-pay | Admitting: Family

## 2022-03-11 DIAGNOSIS — B20 Human immunodeficiency virus [HIV] disease: Secondary | ICD-10-CM

## 2022-03-21 ENCOUNTER — Other Ambulatory Visit: Payer: BC Managed Care – PPO

## 2022-03-21 ENCOUNTER — Other Ambulatory Visit: Payer: Self-pay

## 2022-03-21 ENCOUNTER — Other Ambulatory Visit: Payer: Self-pay | Admitting: Surgical

## 2022-03-21 DIAGNOSIS — Z79899 Other long term (current) drug therapy: Secondary | ICD-10-CM

## 2022-03-21 DIAGNOSIS — B20 Human immunodeficiency virus [HIV] disease: Secondary | ICD-10-CM

## 2022-03-21 DIAGNOSIS — Z113 Encounter for screening for infections with a predominantly sexual mode of transmission: Secondary | ICD-10-CM

## 2022-03-22 LAB — T-HELPER CELL (CD4) - (RCID CLINIC ONLY)
CD4 % Helper T Cell: 43 % (ref 33–65)
CD4 T Cell Abs: 1073 /uL (ref 400–1790)

## 2022-03-23 LAB — HIV-1 RNA QUANT-NO REFLEX-BLD
HIV 1 RNA Quant: NOT DETECTED copies/mL
HIV-1 RNA Quant, Log: NOT DETECTED Log copies/mL

## 2022-03-23 LAB — COMPREHENSIVE METABOLIC PANEL
AG Ratio: 1.7 (calc) (ref 1.0–2.5)
ALT: 19 U/L (ref 9–46)
AST: 12 U/L (ref 10–35)
Albumin: 4.5 g/dL (ref 3.6–5.1)
Alkaline phosphatase (APISO): 54 U/L (ref 35–144)
BUN: 15 mg/dL (ref 7–25)
CO2: 25 mmol/L (ref 20–32)
Calcium: 10.2 mg/dL (ref 8.6–10.3)
Chloride: 100 mmol/L (ref 98–110)
Creat: 0.72 mg/dL (ref 0.70–1.35)
Globulin: 2.7 g/dL (calc) (ref 1.9–3.7)
Glucose, Bld: 139 mg/dL — ABNORMAL HIGH (ref 65–99)
Potassium: 4.2 mmol/L (ref 3.5–5.3)
Sodium: 138 mmol/L (ref 135–146)
Total Bilirubin: 0.4 mg/dL (ref 0.2–1.2)
Total Protein: 7.2 g/dL (ref 6.1–8.1)

## 2022-03-23 LAB — RPR: RPR Ser Ql: NONREACTIVE

## 2022-03-23 LAB — LIPID PANEL
Cholesterol: 180 mg/dL (ref <200)
HDL: 54 mg/dL (ref >=40)
LDL Cholesterol (Calc): 104 mg/dL (calc) — ABNORMAL HIGH
Non-HDL Cholesterol (Calc): 126 mg/dL (calc) (ref <130)
Total CHOL/HDL Ratio: 3.3 (calc) (ref <5.0)
Triglycerides: 119 mg/dL (ref <150)

## 2022-04-04 ENCOUNTER — Encounter: Payer: Self-pay | Admitting: Family

## 2022-04-04 ENCOUNTER — Other Ambulatory Visit: Payer: Self-pay

## 2022-04-04 ENCOUNTER — Other Ambulatory Visit (HOSPITAL_COMMUNITY): Payer: Self-pay

## 2022-04-04 ENCOUNTER — Ambulatory Visit: Payer: BC Managed Care – PPO | Admitting: Family

## 2022-04-04 ENCOUNTER — Encounter: Payer: Medicare Other | Admitting: Family

## 2022-04-04 VITALS — BP 128/78 | HR 94 | Wt 215.0 lb

## 2022-04-04 DIAGNOSIS — Z0000 Encounter for general adult medical examination without abnormal findings: Secondary | ICD-10-CM | POA: Diagnosis not present

## 2022-04-04 DIAGNOSIS — B20 Human immunodeficiency virus [HIV] disease: Secondary | ICD-10-CM

## 2022-04-04 MED ORDER — BIKTARVY 50-200-25 MG PO TABS: 1.0000 | ORAL_TABLET | Freq: Every day | ORAL | 5 refills | Status: DC

## 2022-04-04 NOTE — Patient Instructions (Addendum)
Nice to see you. ? ?Continue to take your medication daily as prescribed. ? ?We will wait until your new insurance starts and evaluate  ? ?Plan for follow up in 4 months or sooner if needed with lab work 1-2 weeks prior to appointment.  ? ?Have a great day and stay safe! ? ?

## 2022-04-04 NOTE — Progress Notes (Signed)
? ? ?Brief Narrative  ? ?Patient ID: William Mcfarland, male    DOB: 1956/10/11, 65 y.o.   MRN: [id number] ? ?William Mcfarland is a caucasian male initially diagnosed with HIV on 03/20/19 with initial viral load of 1.24 million and CD4 count of 690 and risk factor of MSM. [id number] negative. Genotype was without significant mutations. Entered care at [facility] Stage 1.  No history of opportunistic infection. [id number] negative. Sole medication regimen of Biktarvy.  ? ?Subjective:  ?  ?Chief Complaint  ?Patient presents with  ? Follow-up  ?  Copay assistance. New insurance at the end of the month   ? HIV Positive/AIDS  ? ? ?HPI: ? ?William Mcfarland is a 66 y.o. male with HIV disease last seen on 09/20/2021 with well-controlled virus and good adherence and tolerance to his ART regimen of Biktarvy.  Viral load was undetectable with CD4 count of 1113.  Most recent blood work completed on 03/21/2022 with viral load that remains undetectable and CD4 count of 1073.  RPR was nonreactive for syphilis.  Kidney function, liver function, electrolytes within normal ranges.  Here today for routine follow-up. ? ?William Mcfarland continues to take his Biktarvy daily as prescribed with no adverse side effects.  Has concerns about changing insurance and continuation and/cost of medication.  Has been enjoying retirement for the last 3 weeks.  May have feelings of being down given all of his new free time. Denies fevers, chills, night sweats, headaches, changes in vision, neck pain/stiffness, nausea, diarrhea, vomiting, lesions or rashes. ? ?William Mcfarland will be changing insurances as of May 1.  No current recreational illicit drug use, tobacco use, and occasional alcohol consumption.  Condoms offered.  Healthcare maintenance due includes Shingrix and colon cancer screening.   ? ? ?No Known Allergies ? ? ? ?Outpatient Medications Prior to Visit  ?Medication Sig Dispense Refill  ? aspirin EC 81 MG tablet Take 1 tablet (81 mg total) by mouth 2 (two) times  daily. To be taken after surgery 84 tablet 0  ? atorvastatin (LIPITOR) 10 MG tablet Take 10 mg by mouth in the morning.    ? celecoxib (CELEBREX) 100 MG capsule TAKE 1 CAPSULE(100 MG) BY MOUTH TWICE DAILY (Patient taking differently: Take 100-300 mg by mouth daily as needed for moderate pain.) 60 capsule 2  ? docusate sodium (COLACE) 100 MG capsule Take 1 capsule (100 mg total) by mouth daily as needed. 30 capsule 2  ? gabapentin (NEURONTIN) 300 MG capsule TAKE 1 CAPSULE(300 MG) BY MOUTH TWICE DAILY FOR 7 DAYS THEN TAKE 1 CAPSULE(300 MG) BY MOUTH DAILY FOR 7 DAYS 21 capsule 0  ? HYDROcodone-acetaminophen (NORCO) 5-325 MG tablet Take 1-2 tablets by mouth 3 (three) times daily as needed. 30 tablet 0  ? lisinopril-hydrochlorothiazide (ZESTORETIC) 20-12.5 MG tablet Take 1 tablet by mouth 2 (two) times a day.    ? metFORMIN (GLUCOPHAGE) 500 MG tablet Take 1,000 mg by mouth 2 (two) times daily with a meal.    ? methocarbamol (ROBAXIN) 500 MG tablet Take 1 tablet (500 mg total) by mouth 2 (two) times daily as needed. 20 tablet 0  ? mupirocin ointment (BACTROBAN) 2 % Apply 1 application topically 2 (two) times daily. 22 g 0  ? ondansetron (ZOFRAN) 4 MG tablet Take 1 tablet (4 mg total) by mouth every 8 (eight) hours as needed for nausea or vomiting. 40 tablet 0  ? oxyCODONE-acetaminophen (PERCOCET) 5-325 MG tablet Take 1-2 tablets by mouth every 6 (six) hours as  needed. To be taken after surgery 40 tablet 0  ? sulfamethoxazole-trimethoprim (BACTRIM DS) 800-160 MG tablet Take 1 tablet by mouth 2 (two) times daily. To be taken after surgery 20 tablet 0  ? BIKTARVY 50-200-25 MG TABS tablet TAKE 1 TABLET BY MOUTH 1 TIME A DAY. 30 tablet 1  ? ?No facility-administered medications prior to visit.  ? ? ? ?Past Medical History:  ?Diagnosis Date  ? Arthritis   ? shoulders, knees, hips  ? At risk for sleep apnea   ? STOP-BANG= 5     SENT TO PCP 04-12-2015  ? History of colon polyps   ? History of radiation therapy 45 Gy plus seed  implants on 04-13-2015  ? prostate external beam 02-20-2015 to 03-24-2015  ? HIV (human immunodeficiency virus infection) ([place])   ? dx in 2020.  ? Hyperlipidemia   ? Hypertension   ? Lower urinary tract symptoms (LUTS)   ? Prostate cancer [facility]) urologist-  dr ottelin/  oncologist- dr Tammi Klippel  ? T1c, Gleason 4+3,  PSA 6.83,  vol. 32.56cc--  s/p  external beam radiation 02-20-2015 to 03-24-2015  ? Type 2 diabetes mellitus ([place])   ? Type 2 diabetes, diet controlled ([place])   ? Wears glasses   ? ? ? ?Past Surgical History:  ?Procedure Laterality Date  ? COLONOSCOPY W/ POLYPECTOMY  2013  ? EYE SURGERY    ? Cornea replacement left eye (2018)  ? JOINT REPLACEMENT    ? ORIF LEFT LEG FX  1988  ? ORIF RIGHT ANKLE FX  1985  ? PROSTATE BIOPSY    ? RADIOACTIVE SEED IMPLANT N/A 04/14/2015  ? Procedure: RADIOACTIVE SEED IMPLANT;  Surgeon: Kathie Rhodes, MD;  Location: [facility];  Service: Urology;  Laterality: N/A;   64     seeds implanted ?no seeds found in bladder  ? TOTAL HIP ARTHROPLASTY Left 06/13/2021  ? Procedure: LEFT TOTAL HIP ARTHROPLASTY ANTERIOR APPROACH;  Surgeon: Leandrew Koyanagi, MD;  Location: [place];  Service: Orthopedics;  Laterality: Left;  3-C  ? TOTAL KNEE ARTHROPLASTY Left 01/16/2021  ? Procedure: LEFT TOTAL KNEE ARTHROPLASTY;  Surgeon: Meredith Pel, MD;  Location: [place];  Service: Orthopedics;  Laterality: Left;  ? TOTAL SHOULDER ARTHROPLASTY Right 05/21/2016  ? Procedure: TOTAL SHOULDER ARTHROPLASTY;  Surgeon: Meredith Pel, MD;  Location: [place];  Service: Orthopedics;  Laterality: Right;  ? TOTAL SHOULDER ARTHROPLASTY Left 10/05/2019  ? Procedure: LEFT Total shoulder arthroplasty;  Surgeon: Meredith Pel, MD;  Location: [place];  Service: Orthopedics;  Laterality: Left;  ? ? ? ? ?Review of Systems  ?Constitutional:  Negative for appetite change, chills, fatigue, fever and unexpected weight change.  ?Eyes:  Negative for visual disturbance.  ?Respiratory:  Negative for cough, chest  tightness, shortness of breath and wheezing.   ?Cardiovascular:  Negative for chest pain and leg swelling.  ?Gastrointestinal:  Negative for abdominal pain, constipation, diarrhea, nausea and vomiting.  ?Genitourinary:  Negative for dysuria, flank pain, frequency, genital sores, hematuria and urgency.  ?Skin:  Negative for rash.  ?Allergic/Immunologic: Negative for immunocompromised state.  ?Neurological:  Negative for dizziness and headaches.  ?   ?Objective:  ?  ?BP 128/78   Pulse 94   Wt 215 lb (97.5 kg)   SpO2 93%   BMI 34.70 kg/m?  ?Nursing note and vital signs reviewed. ? ?Physical Exam ?Constitutional:   ?   General: He is not in acute distress. ?   Appearance: He is well-developed.  ?  Eyes:  ?   Conjunctiva/sclera: Conjunctivae normal.  ?Cardiovascular:  ?   Rate and Rhythm: Normal rate and regular rhythm.  ?   Heart sounds: Normal heart sounds. No murmur heard. ?  No friction rub. No gallop.  ?Pulmonary:  ?   Effort: Pulmonary effort is normal. No respiratory distress.  ?   Breath sounds: Normal breath sounds. No wheezing or rales.  ?Chest:  ?   Chest wall: No tenderness.  ?Abdominal:  ?   General: Bowel sounds are normal.  ?   Palpations: Abdomen is soft.  ?   Tenderness: There is no abdominal tenderness.  ?Musculoskeletal:  ?   Cervical back: Neck supple.  ?Lymphadenopathy:  ?   Cervical: No cervical adenopathy.  ?Skin: ?   General: Skin is warm and dry.  ?   Findings: No rash.  ?Neurological:  ?   Mental Status: He is alert and oriented to person, place, and time.  ?Psychiatric:     ?   Behavior: Behavior normal.     ?   Thought Content: Thought content normal.     ?   Judgment: Judgment normal.  ? ? ? ? ?  09/20/2021  ?  4:01 PM 03/06/2021  ?  8:37 AM 10/17/2020  ?  8:39 AM 07/03/2020  ?  8:48 AM 10/18/2019  ?  8:42 AM  ?Depression screen PHQ 2/9  ?Decreased Interest 1 0 0 0 0  ?Down, Depressed, Hopeless 1 0 1 0 0  ?PHQ - 2 Score 2 0 1 0 0  ?Altered sleeping 0      ?Tired, decreased energy 0      ?Change  in appetite 0      ?Feeling bad or failure about yourself  1      ?Trouble concentrating 1      ?Moving slowly or fidgety/restless 0      ?Suicidal thoughts 0      ?PHQ-9 Score 4      ?  ?   ?Assessment & Plan:  ?

## 2022-04-04 NOTE — Assessment & Plan Note (Addendum)
William Mcfarland continues to have well-controlled virus with good adherence and tolerance to his ART regimen of Biktarvy.  We reviewed lab work results and discussed plan of care.  Pharmacy staff looking into changes of insurance and financial assistance for medication.  Continue current dose of Biktarvy.  Plan for follow-up in 4 months or sooner if needed with lab work 1 to 2 weeks prior to appointment. ?

## 2022-04-04 NOTE — Assessment & Plan Note (Signed)
?   Discussed importance of safe sexual practices and condom use.  Condoms offered. ?? Due for Shingrix and colon cancer screening. ?

## 2022-07-16 ENCOUNTER — Other Ambulatory Visit: Payer: Medicare PPO

## 2022-07-16 ENCOUNTER — Other Ambulatory Visit: Payer: Self-pay

## 2022-07-16 DIAGNOSIS — B20 Human immunodeficiency virus [HIV] disease: Secondary | ICD-10-CM | POA: Diagnosis not present

## 2022-07-17 LAB — T-HELPER CELL (CD4) - (RCID CLINIC ONLY)
CD4 % Helper T Cell: 46 % (ref 33–65)
CD4 T Cell Abs: 927 /uL (ref 400–1790)

## 2022-07-18 LAB — COMPREHENSIVE METABOLIC PANEL
AG Ratio: 1.7 (calc) (ref 1.0–2.5)
ALT: 26 U/L (ref 9–46)
AST: 17 U/L (ref 10–35)
Albumin: 4.6 g/dL (ref 3.6–5.1)
Alkaline phosphatase (APISO): 56 U/L (ref 35–144)
BUN: 14 mg/dL (ref 7–25)
CO2: 26 mmol/L (ref 20–32)
Calcium: 10.6 mg/dL — ABNORMAL HIGH (ref 8.6–10.3)
Chloride: 98 mmol/L (ref 98–110)
Creat: 0.91 mg/dL (ref 0.70–1.35)
Globulin: 2.7 g/dL (calc) (ref 1.9–3.7)
Glucose, Bld: 123 mg/dL — ABNORMAL HIGH (ref 65–99)
Potassium: 4.5 mmol/L (ref 3.5–5.3)
Sodium: 135 mmol/L (ref 135–146)
Total Bilirubin: 0.5 mg/dL (ref 0.2–1.2)
Total Protein: 7.3 g/dL (ref 6.1–8.1)

## 2022-07-18 LAB — HIV-1 RNA QUANT-NO REFLEX-BLD
HIV 1 RNA Quant: <20 Copies/mL — ABNORMAL HIGH
HIV-1 RNA Quant, Log: <1.3 Log cps/mL — ABNORMAL HIGH

## 2022-07-30 ENCOUNTER — Other Ambulatory Visit: Payer: Self-pay

## 2022-07-30 ENCOUNTER — Encounter: Payer: Self-pay | Admitting: Family

## 2022-07-30 ENCOUNTER — Ambulatory Visit: Payer: Medicare PPO | Admitting: Family

## 2022-07-30 VITALS — BP 120/81 | HR 90 | Resp 16 | Ht 66.0 in | Wt 197.0 lb

## 2022-07-30 DIAGNOSIS — Z79899 Other long term (current) drug therapy: Secondary | ICD-10-CM

## 2022-07-30 DIAGNOSIS — Z0000 Encounter for general adult medical examination without abnormal findings: Secondary | ICD-10-CM | POA: Diagnosis not present

## 2022-07-30 DIAGNOSIS — Z5181 Encounter for therapeutic drug level monitoring: Secondary | ICD-10-CM | POA: Diagnosis not present

## 2022-07-30 DIAGNOSIS — I10 Essential (primary) hypertension: Secondary | ICD-10-CM | POA: Diagnosis not present

## 2022-07-30 DIAGNOSIS — Z8619 Personal history of other infectious and parasitic diseases: Secondary | ICD-10-CM | POA: Insufficient documentation

## 2022-07-30 DIAGNOSIS — Z21 Asymptomatic human immunodeficiency virus [HIV] infection status: Secondary | ICD-10-CM | POA: Insufficient documentation

## 2022-07-30 DIAGNOSIS — Z113 Encounter for screening for infections with a predominantly sexual mode of transmission: Secondary | ICD-10-CM

## 2022-07-30 DIAGNOSIS — B20 Human immunodeficiency virus [HIV] disease: Secondary | ICD-10-CM

## 2022-07-30 DIAGNOSIS — A530 Latent syphilis, unspecified as early or late: Secondary | ICD-10-CM | POA: Insufficient documentation

## 2022-07-30 DIAGNOSIS — Z8546 Personal history of malignant neoplasm of prostate: Secondary | ICD-10-CM | POA: Insufficient documentation

## 2022-07-30 MED ORDER — BIKTARVY 50-200-25 MG PO TABS: 1.0000 | ORAL_TABLET | Freq: Every day | ORAL | 5 refills | Status: DC

## 2022-07-30 NOTE — Progress Notes (Signed)
Brief Narrative   Patient ID: William Mcfarland, male    DOB: 03-22-1956, 66 y.o.   MRN: [id number]  William Mcfarland is a 66 y/o caucasian male initially diagnosed with HIV on 66/4/20 with initial viral load of 1.24 million and CD4 count of 690 and risk factor of MSM. [id number] negative. Genotype was without significant mutations. Entered care at [facility] Stage 1.  No history of opportunistic infection. [id number] negative. Sole medication regimen of Biktarvy.   Subjective:    Chief Complaint  Patient presents with   Follow-up    HPI:  William Mcfarland is a 66 y.o. male with HIV disease last seen on 04/04/22 with well controlled virus with good adherence and tolerance to Biktarvy.  Viral load was undetectable with CD4 count of 1073.  Kidney function, liver function, electrolytes within normal ranges.  Most recent blood work completed on 07/16/2022 with viral load that remains undetectable and CD4 count of 927.  Kidney function, liver function, electrolytes within normal ranges.  Here today for routine follow-up.  William Mcfarland continues to take [facility] as prescribed with no adverse side effects.  Feeling well today with concern for changing pharmacies. Denies fevers, chills, night sweats, headaches, changes in vision, neck pain/stiffness, nausea, diarrhea, vomiting, lesions or rashes.  William Mcfarland has no feelings of being down, depressed, or hopeless recently.  No current recreational/illicit drug use with occasional alcohol consumption and no tobacco use.  Condoms and STD testing offered.  Healthcare maintenance due includes Shingrix. Had a colonoscopy at 3 years ago.    Allergies  Allergen Reactions   Amoxicillin Rash and Other (See Comments)      Outpatient Medications Prior to Visit  Medication Sig Dispense Refill   atorvastatin (LIPITOR) 10 MG tablet Take 10 mg by mouth in the morning.     lisinopril-hydrochlorothiazide (ZESTORETIC) 20-12.5 MG tablet Take 1 tablet by mouth 2 (two) times  a day.     metFORMIN (GLUCOPHAGE) 500 MG tablet Take 1,000 mg by mouth 2 (two) times daily with a meal.     bictegravir-emtricitabine-tenofovir AF (BIKTARVY) 50-200-25 MG TABS tablet Take 1 tablet by mouth daily. 30 tablet 5   gabapentin (NEURONTIN) 300 MG capsule TAKE 1 CAPSULE(300 MG) BY MOUTH TWICE DAILY FOR 7 DAYS THEN TAKE 1 CAPSULE(300 MG) BY MOUTH DAILY FOR 7 DAYS (Patient not taking: Reported on 07/30/2022) 21 capsule 0   aspirin EC 81 MG tablet Take 1 tablet (81 mg total) by mouth 2 (two) times daily. To be taken after surgery (Patient not taking: Reported on 07/30/2022) 84 tablet 0   celecoxib (CELEBREX) 100 MG capsule TAKE 1 CAPSULE(100 MG) BY MOUTH TWICE DAILY (Patient not taking: Reported on 07/30/2022) 60 capsule 2   HYDROcodone-acetaminophen (NORCO) 5-325 MG tablet Take 1-2 tablets by mouth 3 (three) times daily as needed. (Patient not taking: Reported on 07/30/2022) 30 tablet 0   methocarbamol (ROBAXIN) 500 MG tablet Take 1 tablet (500 mg total) by mouth 2 (two) times daily as needed. (Patient not taking: Reported on 07/30/2022) 20 tablet 0   mupirocin ointment (BACTROBAN) 2 % Apply 1 application topically 2 (two) times daily. (Patient not taking: Reported on 07/30/2022) 22 g 0   ondansetron (ZOFRAN) 4 MG tablet Take 1 tablet (4 mg total) by mouth every 8 (eight) hours as needed for nausea or vomiting. 40 tablet 0   oxyCODONE-acetaminophen (PERCOCET) 5-325 MG tablet Take 1-2 tablets by mouth every 6 (six) hours as needed. To be taken after surgery 40  tablet 0   sulfamethoxazole-trimethoprim (BACTRIM DS) 800-160 MG tablet Take 1 tablet by mouth 2 (two) times daily. To be taken after surgery 20 tablet 0   No facility-administered medications prior to visit.     Past Medical History:  Diagnosis Date   Arthritis    shoulders, knees, hips   At risk for sleep apnea    STOP-BANG= 5     SENT TO PCP 04-12-2015   History of colon polyps    History of radiation therapy 45 Gy plus seed implants  on 04-13-2015   prostate external beam 02-20-2015 to 03-24-2015   HIV (human immunodeficiency virus infection) ([place])    dx in 2020.   Hyperlipidemia    Hypertension    Lower urinary tract symptoms (LUTS)    Prostate cancer [facility]) urologist-  dr ottelin/  oncologist- dr Tammi Klippel   T1c, Gleason 4+3,  PSA 6.83,  vol. 32.56cc--  s/p  external beam radiation 02-20-2015 to 03-24-2015   Type 2 diabetes mellitus ([place])    Type 2 diabetes, diet controlled ([place])    Wears glasses      Past Surgical History:  Procedure Laterality Date   COLONOSCOPY W/ POLYPECTOMY  2013   EYE SURGERY     Cornea replacement left eye (2018)   JOINT REPLACEMENT     ORIF LEFT LEG FX  1988   ORIF RIGHT ANKLE FX  1985   PROSTATE BIOPSY     RADIOACTIVE SEED IMPLANT N/A 04/14/2015   Procedure: RADIOACTIVE SEED IMPLANT;  Surgeon: Kathie Rhodes, MD;  Location: [place];  Service: Urology;  Laterality: N/A;   7     seeds implanted no seeds found in bladder   TOTAL HIP ARTHROPLASTY Left 06/13/2021   Procedure: LEFT TOTAL HIP ARTHROPLASTY ANTERIOR APPROACH;  Surgeon: Leandrew Koyanagi, MD;  Location: [place];  Service: Orthopedics;  Laterality: Left;  3-C   TOTAL KNEE ARTHROPLASTY Left 01/16/2021   Procedure: LEFT TOTAL KNEE ARTHROPLASTY;  Surgeon: Meredith Pel, MD;  Location: [place];  Service: Orthopedics;  Laterality: Left;   TOTAL SHOULDER ARTHROPLASTY Right 05/21/2016   Procedure: TOTAL SHOULDER ARTHROPLASTY;  Surgeon: Meredith Pel, MD;  Location: [place];  Service: Orthopedics;  Laterality: Right;   TOTAL SHOULDER ARTHROPLASTY Left 10/05/2019   Procedure: LEFT Total shoulder arthroplasty;  Surgeon: Meredith Pel, MD;  Location: [place];  Service: Orthopedics;  Laterality: Left;      Review of Systems  Constitutional:  Negative for appetite change, chills, fatigue, fever and unexpected weight change.  Eyes:  Negative for visual disturbance.  Respiratory:  Negative for cough, chest tightness,  shortness of breath and wheezing.   Cardiovascular:  Negative for chest pain and leg swelling.  Gastrointestinal:  Negative for abdominal pain, constipation, diarrhea, nausea and vomiting.  Genitourinary:  Negative for dysuria, flank pain, frequency, genital sores, hematuria and urgency.  Skin:  Negative for rash.  Allergic/Immunologic: Negative for immunocompromised state.  Neurological:  Negative for dizziness and headaches.      Objective:    BP 120/81   Pulse 90   Resp 16   Ht '5\' 6"'$  (1.676 m)   Wt 197 lb (89.4 kg)   SpO2 95%   BMI 31.80 kg/m  Nursing note and vital signs reviewed.  Physical Exam Constitutional:      General: He is not in acute distress.    Appearance: He is well-developed.  Eyes:     Conjunctiva/sclera: Conjunctivae normal.  Cardiovascular:  Rate and Rhythm: Normal rate and regular rhythm.     Heart sounds: Normal heart sounds. No murmur heard.    No friction rub. No gallop.  Pulmonary:     Effort: Pulmonary effort is normal. No respiratory distress.     Breath sounds: Normal breath sounds. No wheezing or rales.  Chest:     Chest wall: No tenderness.  Abdominal:     General: Bowel sounds are normal.     Palpations: Abdomen is soft.     Tenderness: There is no abdominal tenderness.  Musculoskeletal:     Cervical back: Neck supple.  Lymphadenopathy:     Cervical: No cervical adenopathy.  Skin:    General: Skin is warm and dry.     Findings: No rash.  Neurological:     Mental Status: He is alert and oriented to person, place, and time.  Psychiatric:        Behavior: Behavior normal.        Thought Content: Thought content normal.        Judgment: Judgment normal.         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   HIV positive ([place]) 07/30/2022   History of syphilis 07/30/2022   History of malignant neoplasm of prostate 07/30/2022   Therapeutic drug monitoring 07/30/2022   Status post total replacement of left hip 06/13/2021   Primary osteoarthritis of left hip 06/12/2021   Arthritis of left knee    S/P total knee arthroplasty, left 01/16/2021   Gout 07/03/2020   OA (osteoarthritis) of shoulder 10/05/2019   Healthcare maintenance 06/03/2019   Human immunodeficiency virus (HIV) disease ([place]) 05/03/2019   Type 2 diabetes mellitus with hyperglycemia, without long-term current use of insulin ([place]) 05/03/2019   Essential hypertension 05/03/2019   Class 1 obesity due to excess calories with serious comorbidity and body mass index (BMI) of 32.0 to 32.9 in adult 05/03/2019   Degenerative arthritis of shoulder region 05/21/2016   Malignant neoplasm of prostate ([place]) 11/20/2014   Travel advice encounter 04/01/2014     Problem List Items Addressed This Visit       Cardiovascular and Mediastinum   Essential hypertension    Blood pressure well controlled with current dose of lisinopril-hydrochlorothiazide.  No neurological or ophthalmologic signs/symptoms.  Continue current dose of lisinopril-hydrochlorothiazide with changes per internal medicine.        Other   Human immunodeficiency virus (HIV) disease ([place]) - Primary    William Mcfarland continues to have well-controlled virus with good adherence and tolerance to [employer].  Reviewed previous lab work and discussed  reviewed lab work and discussed plan of care.  Continue current dose of Biktarvy.  Plan for follow-up in 6 months or  sooner if needed with lab work 1 to 2 weeks prior to appointment.      Relevant Medications   bictegravir-emtricitabine-tenofovir AF (BIKTARVY) 50-200-25 MG TABS tablet   Other Relevant Orders   Comprehensive metabolic panel   HIV-1 RNA quant-no reflex-bld   T-helper cell (CD4)- ([facility] clinic only)   Healthcare maintenance    Discussed importance of safe sexual practice and condom use.  Condoms and STD testing offered. Routine dental care up-to-date per recommendations. Declines Shingrix.      Therapeutic drug monitoring    Renal function stable with creatinine of 0.81, GFR 93, and creatinine clearance of 102.  No evidence of nephrotoxicity.  Continue current dose of Biktarvy.      Other Visit Diagnoses     Pharmacologic therapy       Relevant Orders   Lipid panel   Screening for STDs (sexually transmitted diseases)       Relevant Orders   RPR        I have discontinued [place]'s sulfamethoxazole-trimethoprim, oxyCODONE-acetaminophen, ondansetron, aspirin EC, celecoxib, HYDROcodone-acetaminophen, methocarbamol, and mupirocin ointment. I am also having him maintain his lisinopril-hydrochlorothiazide, atorvastatin, metFORMIN, gabapentin, and Biktarvy.   Meds ordered this encounter  Medications   bictegravir-emtricitabine-tenofovir AF (BIKTARVY) 50-200-25 MG TABS tablet    Sig: Take 1 tablet by mouth daily.    Dispense:  30 tablet    Refill:  5    Order Specific Question:   Supervising Provider    Answer:   Carlyle Basques [[id number]]     Follow-up: Return in about 6 months (around 01/30/2023), or if symptoms worsen or fail to improve.   Terri Piedra, MSN, FNP-C Nurse Practitioner [facility] [place] number: [phone]

## 2022-07-30 NOTE — Assessment & Plan Note (Signed)
Blood pressure well controlled with current dose of lisinopril-hydrochlorothiazide.  No neurological or ophthalmologic signs/symptoms.  Continue current dose of lisinopril-hydrochlorothiazide with changes per internal medicine.

## 2022-07-30 NOTE — Assessment & Plan Note (Signed)
Mr. Mounger continues to have well-controlled virus with good adherence and tolerance to [employer].  Reviewed previous lab work and discussed reviewed lab work and discussed plan of care.  Continue current dose of Biktarvy.  Plan for follow-up in 6 months or sooner if needed with lab work 1 to 2 weeks prior to appointment.

## 2022-07-30 NOTE — Assessment & Plan Note (Signed)
Renal function stable with creatinine of 0.81, GFR 93, and creatinine clearance of 102.  No evidence of nephrotoxicity.  Continue current dose of Biktarvy.

## 2022-07-30 NOTE — Assessment & Plan Note (Signed)
   Discussed importance of safe sexual practice and condom use.  Condoms and STD testing offered.  Routine dental care up-to-date per recommendations.  Declines Shingrix.

## 2022-07-30 NOTE — Patient Instructions (Addendum)
Nice to see you.  Continue to take your medication daily as prescribed.  Refills have been sent to the pharmacy.  Plan for follow up in 6 months or sooner if needed with lab work 1-2 weeks prior to appointment.   Have a great day and stay safe!  

## 2022-10-15 DIAGNOSIS — Z21 Asymptomatic human immunodeficiency virus [HIV] infection status: Secondary | ICD-10-CM | POA: Diagnosis not present

## 2022-10-15 DIAGNOSIS — Z86010 Personal history of colonic polyps: Secondary | ICD-10-CM | POA: Diagnosis not present

## 2022-10-15 DIAGNOSIS — Z6830 Body mass index (BMI) 30.0-30.9, adult: Secondary | ICD-10-CM | POA: Diagnosis not present

## 2022-10-15 DIAGNOSIS — Z0000 Encounter for general adult medical examination without abnormal findings: Secondary | ICD-10-CM | POA: Diagnosis not present

## 2022-10-15 DIAGNOSIS — H9190 Unspecified hearing loss, unspecified ear: Secondary | ICD-10-CM | POA: Diagnosis not present

## 2022-10-15 DIAGNOSIS — E782 Mixed hyperlipidemia: Secondary | ICD-10-CM | POA: Diagnosis not present

## 2022-10-15 DIAGNOSIS — E669 Obesity, unspecified: Secondary | ICD-10-CM | POA: Diagnosis not present

## 2022-10-15 DIAGNOSIS — I10 Essential (primary) hypertension: Secondary | ICD-10-CM | POA: Diagnosis not present

## 2022-10-15 DIAGNOSIS — E1169 Type 2 diabetes mellitus with other specified complication: Secondary | ICD-10-CM | POA: Diagnosis not present

## 2023-01-13 ENCOUNTER — Other Ambulatory Visit: Payer: Self-pay

## 2023-01-13 DIAGNOSIS — B20 Human immunodeficiency virus [HIV] disease: Secondary | ICD-10-CM

## 2023-01-13 MED ORDER — BIKTARVY 50-200-25 MG PO TABS: 1.0000 | ORAL_TABLET | Freq: Every day | ORAL | 0 refills | Status: DC

## 2023-01-21 ENCOUNTER — Other Ambulatory Visit: Payer: Self-pay

## 2023-01-21 ENCOUNTER — Other Ambulatory Visit: Payer: Medicare PPO

## 2023-01-21 DIAGNOSIS — B20 Human immunodeficiency virus [HIV] disease: Secondary | ICD-10-CM | POA: Diagnosis not present

## 2023-01-21 DIAGNOSIS — Z79899 Other long term (current) drug therapy: Secondary | ICD-10-CM

## 2023-01-21 DIAGNOSIS — Z113 Encounter for screening for infections with a predominantly sexual mode of transmission: Secondary | ICD-10-CM | POA: Diagnosis not present

## 2023-01-22 LAB — T-HELPER CELL (CD4) - (RCID CLINIC ONLY)
CD4 % Helper T Cell: 47 % (ref 33–65)
CD4 T Cell Abs: 799 /uL (ref 400–1790)

## 2023-01-23 ENCOUNTER — Telehealth: Payer: Self-pay

## 2023-01-23 LAB — COMPREHENSIVE METABOLIC PANEL
AG Ratio: 1.4 (calc) (ref 1.0–2.5)
ALT: 19 U/L (ref 9–46)
AST: 15 U/L (ref 10–35)
Albumin: 4.4 g/dL (ref 3.6–5.1)
Alkaline phosphatase (APISO): 65 U/L (ref 35–144)
BUN: 16 mg/dL (ref 7–25)
CO2: 28 mmol/L (ref 20–32)
Calcium: 10.4 mg/dL — ABNORMAL HIGH (ref 8.6–10.3)
Chloride: 101 mmol/L (ref 98–110)
Creat: 0.89 mg/dL (ref 0.70–1.35)
Globulin: 3.1 g/dL (calc) (ref 1.9–3.7)
Glucose, Bld: 133 mg/dL — ABNORMAL HIGH (ref 65–99)
Potassium: 5.1 mmol/L (ref 3.5–5.3)
Sodium: 140 mmol/L (ref 135–146)
Total Bilirubin: 0.4 mg/dL (ref 0.2–1.2)
Total Protein: 7.5 g/dL (ref 6.1–8.1)

## 2023-01-23 LAB — T PALLIDUM AB: T Pallidum Abs: POSITIVE — AB

## 2023-01-23 LAB — LIPID PANEL
Cholesterol: 170 mg/dL (ref <200)
HDL: 55 mg/dL (ref >=40)
LDL Cholesterol (Calc): 93 mg/dL (calc)
Non-HDL Cholesterol (Calc): 115 mg/dL (calc) (ref <130)
Total CHOL/HDL Ratio: 3.1 (calc) (ref <5.0)
Triglycerides: 119 mg/dL (ref <150)

## 2023-01-23 LAB — RPR TITER: RPR Titer: 1:1 {titer} — ABNORMAL HIGH

## 2023-01-23 LAB — RPR: RPR Ser Ql: REACTIVE — AB

## 2023-01-23 LAB — HIV-1 RNA QUANT-NO REFLEX-BLD
HIV 1 RNA Quant: NOT DETECTED Copies/mL
HIV-1 RNA Quant, Log: NOT DETECTED Log cps/mL

## 2023-01-23 NOTE — Telephone Encounter (Signed)
-----   Message from [place], [place] sent at 01/23/2023  3:12 PM EST ----- Please inform Mr. Langhorst that his test for syphilis has come back positive. Please determine what his reaction to amoxicillin is. If concern for penicillin allergy can treat with 14 days of 100 mg of Doxycycline PO versus the 2.4 million units Bicillin IM once.

## 2023-01-23 NOTE — Telephone Encounter (Signed)
Patient is aware of results and he states that he is not allergic to amoxicillin and was just prescribed  some for his dental work. Patient is scheduled for bicillin x1 on 2/9.

## 2023-01-24 ENCOUNTER — Other Ambulatory Visit: Payer: Self-pay

## 2023-01-24 ENCOUNTER — Ambulatory Visit (INDEPENDENT_AMBULATORY_CARE_PROVIDER_SITE_OTHER): Payer: Medicare PPO

## 2023-01-24 DIAGNOSIS — A539 Syphilis, unspecified: Secondary | ICD-10-CM | POA: Diagnosis not present

## 2023-01-24 MED ORDER — PENICILLIN G BENZATHINE 1200000 UNIT/2ML IM SUSY
1.2000 10*6.[IU] | PREFILLED_SYRINGE | Freq: Once | INTRAMUSCULAR | Status: AC
  Administered 2023-01-24: 1.2 10*6.[IU] via INTRAMUSCULAR

## 2023-01-24 NOTE — Progress Notes (Signed)
Reviewed and verified allergies with patient. William Mcfarland reports he just took amoxicillin on 1/23 without any issues for a dental procedure. States he's never had an allergic reaction to any substance. Advised that if he develops any concerning symptoms such as hives, wheezing, or shortness of breath that he should go to the emergency department.    Patient tolerated Bicillin injections well. Reinforced abstinence until treatment completed plus an additional 10 days. Advised patient to notify sexual partners for testing and treatment. Patient verbalized understanding.   Beryle Flock, RN

## 2023-01-31 NOTE — Progress Notes (Unsigned)
Brief Narrative   Patient ID: William Mcfarland, male    DOB: [birth year], 67 y.o.   MRN: [id number]  William Mcfarland is a 67 y/o caucasian male initially diagnosed with HIV on 03/20/19 with initial viral load of 1.24 million and CD4 count of 690 and risk factor of MSM. [id number] negative. Genotype was without significant mutations. Entered care at [facility] Stage 1.  No history of opportunistic infection. [id number] negative. Sole medication regimen of Biktarvy.    Subjective:    No chief complaint on file.   HPI:  William Mcfarland is a 67 y.o. male with HIV disease last seen on 07/30/22 with well controlled virus and good adherence and tolerance to Biktarvy. Viral load was undetectable and CD4 count 927. RPR titer was non-reactive. Renal function, hepatic function and electrolytes were normal. Most recent lab work completed on 01/21/23 with viral load that remains undetectable and CD4 count 799. RPR titer was positive at 1:1. Treated for syphilis infection on 01/24/23 with 2.4 million units of Bicillin. Renal function, electrolytes and hepatic function with normal ranges. Here today for follow up.   William Mcfarland has been doing okay since his last office visit. Has questions about possible symptoms of depression as he has lacked motivation to do things recently. Does partake in some exercise on occasion. Continues to take Biktarvy with no adverse side effects or problems getting medication from the pharmacy. Currently retired and considering possible part-time work. Condoms and STD testing offered.   Denies fevers, chills, night sweats, headaches, changes in vision, neck pain/stiffness, nausea, diarrhea, vomiting, lesions or rashes.   No Active Allergies     Outpatient Medications Prior to Visit  Medication Sig Dispense Refill   atorvastatin (LIPITOR) 10 MG tablet Take 10 mg by mouth in the morning.     lisinopril-hydrochlorothiazide (ZESTORETIC) 20-12.5 MG tablet Take 1 tablet by mouth 2 (two)  times a day.     metFORMIN (GLUCOPHAGE) 500 MG tablet Take 1,000 mg by mouth 2 (two) times daily with a meal.     bictegravir-emtricitabine-tenofovir AF (BIKTARVY) 50-200-25 MG TABS tablet Take 1 tablet by mouth daily. 30 tablet 0   gabapentin (NEURONTIN) 300 MG capsule TAKE 1 CAPSULE(300 MG) BY MOUTH TWICE DAILY FOR 7 DAYS THEN TAKE 1 CAPSULE(300 MG) BY MOUTH DAILY FOR 7 DAYS (Patient not taking: Reported on 07/30/2022) 21 capsule 0   No facility-administered medications prior to visit.     Past Medical History:  Diagnosis Date   Arthritis    shoulders, knees, hips   At risk for sleep apnea    STOP-BANG= 5     SENT TO PCP 04-12-2015   History of colon polyps    History of radiation therapy 45 Gy plus seed implants on 04-13-2015   prostate external beam 02-20-2015 to 03-24-2015   HIV (human immunodeficiency virus infection) ([place])    dx in 2020.   Hyperlipidemia    Hypertension    Lower urinary tract symptoms (LUTS)    Prostate cancer [facility]) urologist-  dr ottelin/  oncologist- dr Tammi Klippel   T1c, Gleason 4+3,  PSA 6.83,  vol. 32.56cc--  s/p  external beam radiation 02-20-2015 to 03-24-2015   Type 2 diabetes mellitus ([place])    Type 2 diabetes, diet controlled ([place])    Wears glasses      Past Surgical History:  Procedure Laterality Date   COLONOSCOPY W/ POLYPECTOMY  2013   EYE SURGERY     Cornea replacement left eye (2018)  JOINT REPLACEMENT     ORIF LEFT LEG FX  1988   ORIF RIGHT ANKLE FX  1985   PROSTATE BIOPSY     RADIOACTIVE SEED IMPLANT N/A 04/14/2015   Procedure: RADIOACTIVE SEED IMPLANT;  Surgeon: Kathie Rhodes, MD;  Location: [facility];  Service: Urology;  Laterality: N/A;   2     seeds implanted no seeds found in bladder   TOTAL HIP ARTHROPLASTY Left 06/13/2021   Procedure: LEFT TOTAL HIP ARTHROPLASTY ANTERIOR APPROACH;  Surgeon: Leandrew Koyanagi, MD;  Location: [place];  Service: Orthopedics;  Laterality: Left;  3-C   TOTAL KNEE ARTHROPLASTY Left 01/16/2021    Procedure: LEFT TOTAL KNEE ARTHROPLASTY;  Surgeon: Meredith Pel, MD;  Location: [place];  Service: Orthopedics;  Laterality: Left;   TOTAL SHOULDER ARTHROPLASTY Right 05/21/2016   Procedure: TOTAL SHOULDER ARTHROPLASTY;  Surgeon: Meredith Pel, MD;  Location: [place];  Service: Orthopedics;  Laterality: Right;   TOTAL SHOULDER ARTHROPLASTY Left 10/05/2019   Procedure: LEFT Total shoulder arthroplasty;  Surgeon: Meredith Pel, MD;  Location: [place];  Service: Orthopedics;  Laterality: Left;      Review of Systems  Constitutional:  Negative for appetite change, chills, fatigue, fever and unexpected weight change.  Eyes:  Negative for visual disturbance.  Respiratory:  Negative for cough, chest tightness, shortness of breath and wheezing.   Cardiovascular:  Negative for chest pain and leg swelling.  Gastrointestinal:  Negative for abdominal pain, constipation, diarrhea, nausea and vomiting.  Genitourinary:  Negative for dysuria, flank pain, frequency, genital sores, hematuria and urgency.  Skin:  Negative for rash.  Allergic/Immunologic: Negative for immunocompromised state.  Neurological:  Negative for dizziness and headaches.      Objective:    BP 133/85   Pulse 68   Ht 5' 6"$  (1.676 m)   Wt 183 lb 4.8 oz (83.1 kg)   BMI 29.59 kg/m  Nursing note and vital signs reviewed.  Physical Exam Constitutional:      General: He is not in acute distress.    Appearance: He is well-developed.  Eyes:     Conjunctiva/sclera: Conjunctivae normal.  Cardiovascular:     Rate and Rhythm: Normal rate and regular rhythm.     Heart sounds: Normal heart sounds. No murmur heard.    No friction rub. No gallop.  Pulmonary:     Effort: Pulmonary effort is normal. No respiratory distress.     Breath sounds: Normal breath sounds. No wheezing or rales.  Chest:     Chest wall: No tenderness.  Abdominal:     General: Bowel sounds are normal.     Palpations: Abdomen is soft.     Tenderness:  There is no abdominal tenderness.  Musculoskeletal:     Cervical back: Neck supple.  Lymphadenopathy:     Cervical: No cervical adenopathy.  Skin:    General: Skin is warm and dry.     Findings: No rash.  Neurological:     Mental Status: He is alert and oriented to person, place, and time.  Psychiatric:        Behavior: Behavior normal.        Thought Content: Thought content normal.        Judgment: Judgment normal.         02/04/2023    9:15 AM 07/30/2022    9:08 AM 09/20/2021    4:01 PM 03/06/2021    8:37 AM 10/17/2020    8:39 AM  Depression screen  PHQ 2/9  Decreased Interest 0 0 1 0 0  Down, Depressed, Hopeless 1 0 1 0 1  PHQ - 2 Score 1 0 2 0 1  Altered sleeping   0    Tired, decreased energy   0    Change in appetite   0    Feeling bad or failure about yourself    1    Trouble concentrating   1    Moving slowly or fidgety/restless   0    Suicidal thoughts   0    PHQ-9 Score   4         Assessment & Plan:    Patient Active Problem List   Diagnosis Date Noted   Adjustment disorder with mixed anxiety and depressed mood 02/04/2023   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   HIV positive ([place]) 07/30/2022   History of syphilis 07/30/2022   History of malignant neoplasm of prostate 07/30/2022   Therapeutic drug monitoring 07/30/2022   Status post total replacement of left hip 06/13/2021   Primary osteoarthritis of left hip 06/12/2021   Arthritis of left knee    S/P total knee arthroplasty, left 01/16/2021   Gout 07/03/2020   OA (osteoarthritis) of shoulder 10/05/2019   Healthcare maintenance 06/03/2019   Human immunodeficiency virus (HIV) disease ([place]) 05/03/2019   Type 2 diabetes mellitus with hyperglycemia, without long-term current use of insulin ([place]) 05/03/2019   Essential hypertension 05/03/2019   Class 1 obesity due to excess calories with serious comorbidity and body mass index (BMI) of 32.0 to 32.9 in adult 05/03/2019   Degenerative arthritis of shoulder region  05/21/2016   Malignant neoplasm of prostate ([place]) 11/20/2014   Travel advice encounter 04/01/2014     Problem List Items Addressed This Visit       Other   Human immunodeficiency virus (HIV) disease ([place]) - Primary    Mr. Dilello continues to have well controlled virus with good adherence and Biktarvy. Reviewed lab work and discussed plan of care. Continue current dose of Biktarvy. Plan for follow up in 6 months with lab work 1-2 weeks prior to appointment.       Relevant Medications   bictegravir-emtricitabine-tenofovir AF (BIKTARVY) 50-200-25 MG TABS tablet   Healthcare maintenance    Discussed importance of safe sexual practice and condom use. Condoms and STD testing offered.  Due for colonoscopy for colon cancer screening.  Routine dental care up to date.       Adjustment disorder with mixed anxiety and depressed mood    Mr. Daddio appears to have adjustment reaction to newly retired life. Working on getting out and having hobbies. Considering part-work or volunteering. Recommend counseling if needed. No current medication recommendations.         I have discontinued Yandel Valls. Necaise's gabapentin. I am also having him maintain his lisinopril-hydrochlorothiazide, atorvastatin, metFORMIN, and Biktarvy.   Meds ordered this encounter  Medications   bictegravir-emtricitabine-tenofovir AF (BIKTARVY) 50-200-25 MG TABS tablet    Sig: Take 1 tablet by mouth daily.    Dispense:  30 tablet    Refill:  6    Order Specific Question:   Supervising Provider    Answer:   Carlyle Basques [[id number]]     Follow-up: Return in about 6 months (around 08/05/2023), or if symptoms worsen or fail to improve.   Terri Piedra, MSN, FNP-C Nurse Practitioner [facility] for Infectious Disease [place] number: [phone]

## 2023-02-04 ENCOUNTER — Other Ambulatory Visit: Payer: Self-pay

## 2023-02-04 ENCOUNTER — Encounter: Payer: Self-pay | Admitting: Family

## 2023-02-04 ENCOUNTER — Ambulatory Visit: Payer: Medicare PPO | Admitting: Family

## 2023-02-04 VITALS — BP 133/85 | HR 68 | Ht 66.0 in | Wt 183.3 lb

## 2023-02-04 DIAGNOSIS — F4323 Adjustment disorder with mixed anxiety and depressed mood: Secondary | ICD-10-CM | POA: Diagnosis not present

## 2023-02-04 DIAGNOSIS — B20 Human immunodeficiency virus [HIV] disease: Secondary | ICD-10-CM

## 2023-02-04 DIAGNOSIS — Z0000 Encounter for general adult medical examination without abnormal findings: Secondary | ICD-10-CM | POA: Diagnosis not present

## 2023-02-04 MED ORDER — BIKTARVY 50-200-25 MG PO TABS: 1.0000 | ORAL_TABLET | Freq: Every day | ORAL | 6 refills | Status: DC

## 2023-02-04 NOTE — Assessment & Plan Note (Signed)
Discussed importance of safe sexual practice and condom use. Condoms and STD testing offered.  Due for colonoscopy for colon cancer screening.  Routine dental care up to date.

## 2023-02-04 NOTE — Assessment & Plan Note (Signed)
William Mcfarland continues to have well controlled virus with good adherence and Biktarvy. Reviewed lab work and discussed plan of care. Continue current dose of Biktarvy. Plan for follow up in 6 months with lab work 1-2 weeks prior to appointment.

## 2023-02-04 NOTE — Patient Instructions (Addendum)
Nice to see you.  Continue to take your medication daily as prescribed.  Refills have been sent to the pharmacy.  Plan for follow up in 6 months or sooner if needed with lab work 1-2 weeks prior to appointment.   Have a great day and stay safe!

## 2023-02-04 NOTE — Assessment & Plan Note (Signed)
William Mcfarland appears to have adjustment reaction to newly retired life. Working on getting out and having hobbies. Considering part-work or volunteering. Recommend counseling if needed. No current medication recommendations.

## 2023-03-10 DIAGNOSIS — Z114 Encounter for screening for human immunodeficiency virus [HIV]: Secondary | ICD-10-CM | POA: Diagnosis not present

## 2023-03-10 DIAGNOSIS — Z1159 Encounter for screening for other viral diseases: Secondary | ICD-10-CM | POA: Diagnosis not present

## 2023-03-10 DIAGNOSIS — I700 Atherosclerosis of aorta: Secondary | ICD-10-CM | POA: Diagnosis not present

## 2023-03-10 DIAGNOSIS — E1151 Type 2 diabetes mellitus with diabetic peripheral angiopathy without gangrene: Secondary | ICD-10-CM | POA: Diagnosis not present

## 2023-03-10 DIAGNOSIS — Z87891 Personal history of nicotine dependence: Secondary | ICD-10-CM | POA: Diagnosis not present

## 2023-03-10 DIAGNOSIS — Z79899 Other long term (current) drug therapy: Secondary | ICD-10-CM | POA: Diagnosis not present

## 2023-03-10 DIAGNOSIS — Z8546 Personal history of malignant neoplasm of prostate: Secondary | ICD-10-CM | POA: Diagnosis not present

## 2023-03-10 DIAGNOSIS — Z23 Encounter for immunization: Secondary | ICD-10-CM | POA: Diagnosis not present

## 2023-03-10 DIAGNOSIS — B20 Human immunodeficiency virus [HIV] disease: Secondary | ICD-10-CM | POA: Diagnosis not present

## 2023-07-11 ENCOUNTER — Other Ambulatory Visit: Payer: Self-pay

## 2023-07-11 DIAGNOSIS — Z113 Encounter for screening for infections with a predominantly sexual mode of transmission: Secondary | ICD-10-CM

## 2023-07-11 DIAGNOSIS — B20 Human immunodeficiency virus [HIV] disease: Secondary | ICD-10-CM

## 2023-07-11 DIAGNOSIS — Z79899 Other long term (current) drug therapy: Secondary | ICD-10-CM

## 2023-07-15 ENCOUNTER — Other Ambulatory Visit: Payer: Medicare PPO

## 2023-07-15 ENCOUNTER — Other Ambulatory Visit: Payer: Self-pay

## 2023-07-15 ENCOUNTER — Other Ambulatory Visit (HOSPITAL_COMMUNITY)
Admission: RE | Admit: 2023-07-15 | Discharge: 2023-07-15 | Disposition: A | Discharge status: Home / Self Care | Payer: Medicare PPO | Source: Ambulatory Visit | Attending: Family | Admitting: Family

## 2023-07-15 DIAGNOSIS — Z113 Encounter for screening for infections with a predominantly sexual mode of transmission: Secondary | ICD-10-CM

## 2023-07-15 DIAGNOSIS — B20 Human immunodeficiency virus [HIV] disease: Secondary | ICD-10-CM | POA: Insufficient documentation

## 2023-07-15 DIAGNOSIS — Z79899 Other long term (current) drug therapy: Secondary | ICD-10-CM | POA: Insufficient documentation

## 2023-07-29 ENCOUNTER — Ambulatory Visit: Payer: Medicare PPO | Admitting: Family

## 2023-07-29 ENCOUNTER — Other Ambulatory Visit: Payer: Self-pay

## 2023-07-29 ENCOUNTER — Encounter: Payer: Self-pay | Admitting: Family

## 2023-07-29 VITALS — BP 137/79 | HR 80 | Temp 98.5°F | Ht 65.0 in | Wt 189.0 lb

## 2023-07-29 DIAGNOSIS — B20 Human immunodeficiency virus [HIV] disease: Secondary | ICD-10-CM | POA: Diagnosis not present

## 2023-07-29 DIAGNOSIS — Z79899 Other long term (current) drug therapy: Secondary | ICD-10-CM | POA: Diagnosis not present

## 2023-07-29 DIAGNOSIS — Z0000 Encounter for general adult medical examination without abnormal findings: Secondary | ICD-10-CM

## 2023-07-29 DIAGNOSIS — Z113 Encounter for screening for infections with a predominantly sexual mode of transmission: Secondary | ICD-10-CM

## 2023-07-29 MED ORDER — BIKTARVY 50-200-25 MG PO TABS
1.0000 | ORAL_TABLET | Freq: Every day | ORAL | 6 refills | Status: DC
Start: 2023-07-29 — End: 2024-02-03

## 2023-07-29 NOTE — Patient Instructions (Addendum)
Nice to see you.  Continue to take your medication daily as prescribed.  Refills have been sent to the pharmacy.  Plan for follow up in 6 months or sooner if needed with lab work 1-2 weeks prior to appointment.   Have a great day and stay safe!  

## 2023-07-29 NOTE — Assessment & Plan Note (Signed)
Discussed importance of safe sexual practice and condom use. Condoms and STD testing offered.  Discussed vaccinations and declines Shingrix.  Due for colonoscopy which he declined for now.

## 2023-07-29 NOTE — Progress Notes (Signed)
Brief Narrative   Patient ID: CASHE SOLAN, male    DOB: December 06, 1956, 67 y.o.   MRN: [id number]  Mr. Brooks is a 67 y/o caucasian male initially diagnosed with HIV on 03/20/19 with initial viral load of 1.24 million and CD4 count of 690 and risk factor of MSM. [id number] negative. Genotype was without significant mutations. Entered care at [facility] Stage 1.  No history of opportunistic infection. [id number] negative. Sole medication regimen of Biktarvy.    Subjective:    Chief Complaint  Patient presents with   Follow-up   HIV Positive/AIDS    HPI:  SENECA KHANNA is a 67 y.o. male with HIV disease last seen on 02/04/2023 with well-controlled virus and good adherence and tolerance to [employer].  Viral load was undetectable with CD4 count of 799.  Kidney function, liver function, electrolytes within normal ranges.  RPR was newly positive at 1: 1 and was treated with 2,400,000 units of Bicillin once.  Most recent lab work completed on 07/15/2023 with viral load that remains undetectable and CD4 count 1181.  Kidney function, liver function, electrolytes remain within normal limits.  RPR was nonreactive.  Here today for routine follow-up.  Grayling has been doing well since last office visit and continues to take [place] as prescribed with no adverse side effects. Has been noticing that he is bruising more easily lately. No new concerns/complaints. Condoms and STD testing offered. Routine dental care is up to date. Healthcare maintenance due includes colon cancer screening and Shingrix.   Denies fevers, chills, night sweats, headaches, changes in vision, neck pain/stiffness, nausea, diarrhea, vomiting, lesions or rashes.   No Known Allergies    Outpatient Medications Prior to Visit  Medication Sig Dispense Refill   atorvastatin (LIPITOR) 10 MG tablet Take 10 mg by mouth in the morning.     lisinopril-hydrochlorothiazide (ZESTORETIC) 20-12.5 MG tablet Take 1 tablet by mouth 2 (two)  times a day.     metFORMIN (GLUCOPHAGE) 500 MG tablet Take 1,000 mg by mouth 2 (two) times daily with a meal.     bictegravir-emtricitabine-tenofovir AF (BIKTARVY) 50-200-25 MG TABS tablet Take 1 tablet by mouth daily. 30 tablet 6   No facility-administered medications prior to visit.     Past Medical History:  Diagnosis Date   Arthritis    shoulders, knees, hips   At risk for sleep apnea    STOP-BANG= 5     SENT TO PCP 04-12-2015   History of colon polyps    History of radiation therapy 45 Gy plus seed implants on 04-13-2015   prostate external beam 02-20-2015 to 03-24-2015   HIV (human immunodeficiency virus infection) (H[facility])    dx in 2020.   Hyperlipidemia    Hypertension    Lower urinary tract symptoms (LUTS)    Prostate cancer [facility]) urologist-  dr ottelin/  oncologist- dr Kathrynn Running   T1c, Gleason 4+3,  PSA 6.83,  vol. 32.56cc--  s/p  external beam radiation 02-20-2015 to 03-24-2015   Type 2 diabetes mellitus (H[facility])    Type 2 diabetes, diet controlled (H[facility])    Wears glasses      Past Surgical History:  Procedure Laterality Date   COLONOSCOPY W/ POLYPECTOMY  2013   EYE SURGERY     Cornea replacement left eye (2018)   JOINT REPLACEMENT     ORIF LEFT LEG FX  1988   ORIF RIGHT ANKLE FX  1985   PROSTATE BIOPSY     RADIOACTIVE SEED IMPLANT N/A 04/14/2015  Procedure: RADIOACTIVE SEED IMPLANT;  Surgeon: Ihor Gully, MD;  Location: [facility];  Service: Urology;  Laterality: N/A;   64     seeds implanted no seeds found in bladder   TOTAL HIP ARTHROPLASTY Left 06/13/2021   Procedure: LEFT TOTAL HIP ARTHROPLASTY ANTERIOR APPROACH;  Surgeon: Tarry Kos, MD;  Location: [facility];  Service: Orthopedics;  Laterality: Left;  3-C   TOTAL KNEE ARTHROPLASTY Left 01/16/2021   Procedure: LEFT TOTAL KNEE ARTHROPLASTY;  Surgeon: Cammy Copa, MD;  Location: [facility] OR;  Service: Orthopedics;  Laterality: Left;   TOTAL SHOULDER ARTHROPLASTY Right 05/21/2016   Procedure: TOTAL  SHOULDER ARTHROPLASTY;  Surgeon: Cammy Copa, MD;  Location: [facility] OR;  Service: Orthopedics;  Laterality: Right;   TOTAL SHOULDER ARTHROPLASTY Left 10/05/2019   Procedure: LEFT Total shoulder arthroplasty;  Surgeon: Cammy Copa, MD;  Location: [facility] OR;  Service: Orthopedics;  Laterality: Left;      Review of Systems  Constitutional:  Negative for appetite change, chills, fatigue, fever and unexpected weight change.  Eyes:  Negative for visual disturbance.  Respiratory:  Negative for cough, chest tightness, shortness of breath and wheezing.   Cardiovascular:  Negative for chest pain and leg swelling.  Gastrointestinal:  Negative for abdominal pain, constipation, diarrhea, nausea and vomiting.  Genitourinary:  Negative for dysuria, flank pain, frequency, genital sores, hematuria and urgency.  Skin:  Negative for rash.  Allergic/Immunologic: Negative for immunocompromised state.  Neurological:  Negative for dizziness and headaches.      Objective:    BP 137/79   Pulse 80   Temp 98.5 F (36.9 C) (Temporal)   Ht 5\' 5"  (1.651 m)   Wt 189 lb (85.7 kg)   SpO2 97%   BMI 31.45 kg/m  Nursing note and vital signs reviewed.  Physical Exam Constitutional:      General: He is not in acute distress.    Appearance: He is well-developed.  Eyes:     Conjunctiva/sclera: Conjunctivae normal.  Cardiovascular:     Rate and Rhythm: Normal rate and regular rhythm.     Heart sounds: Normal heart sounds. No murmur heard.    No friction rub. No gallop.  Pulmonary:     Effort: Pulmonary effort is normal. No respiratory distress.     Breath sounds: Normal breath sounds. No wheezing or rales.  Chest:     Chest wall: No tenderness.  Abdominal:     General: Bowel sounds are normal.     Palpations: Abdomen is soft.     Tenderness: There is no abdominal tenderness.  Musculoskeletal:     Cervical back: Neck supple.  Lymphadenopathy:     Cervical: No cervical adenopathy.  Skin:     General: Skin is warm and dry.     Findings: No rash.  Neurological:     Mental Status: He is alert and oriented to person, place, and time.  Psychiatric:        Behavior: Behavior normal.        Thought Content: Thought content normal.        Judgment: Judgment normal.         07/29/2023    8:41 AM 02/04/2023    9:15 AM 07/30/2022    9:08 AM 09/20/2021    4:01 PM 03/06/2021    8:37 AM  Depression screen PHQ 2/9  Decreased Interest 0 0 0 1 0  Down, Depressed, Hopeless 0 1 0 1 0  PHQ - 2 Score 0  1 0 2 0  Altered sleeping    0   Tired, decreased energy    0   Change in appetite    0   Feeling bad or failure about yourself     1   Trouble concentrating    1   Moving slowly or fidgety/restless    0   Suicidal thoughts    0   PHQ-9 Score    4        Assessment & Plan:    Patient Active Problem List   Diagnosis Date Noted   Adjustment disorder with mixed anxiety and depressed mood 02/04/2023   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   History of syphilis 07/30/2022   History of malignant neoplasm of prostate 07/30/2022   Therapeutic drug monitoring 07/30/2022   Status post total replacement of left hip 06/13/2021   Primary osteoarthritis of left hip 06/12/2021   Arthritis of left knee    S/P total knee arthroplasty, left 01/16/2021   Gout 07/03/2020   OA (osteoarthritis) of shoulder 10/05/2019   Healthcare maintenance 06/03/2019   Human immunodeficiency virus (HIV) disease (HCC) 05/03/2019   Type 2 diabetes mellitus with hyperglycemia, without long-term current use of insulin (HCC) 05/03/2019   Essential hypertension 05/03/2019   Class 1 obesity due to excess calories with serious comorbidity and body mass index (BMI) of 32.0 to 32.9 in adult 05/03/2019   Degenerative arthritis of shoulder region 05/21/2016   Malignant neoplasm of prostate (HCC) 11/20/2014   Travel advice encounter 04/01/2014     Problem List Items Addressed This Visit       Other   Human immunodeficiency  virus (HIV) disease (HCC) - Primary    Craven continues to have well controlled virus with good adherence and tolerance to [employer]. Reviewed lab work and discussed plan of care and U equals U. Continue current dose of Biktarvy. Plan for follow up in 6 months or sooner if needed with lab work 1-2 weeks prior to appointment.       Relevant Medications   bictegravir-emtricitabine-tenofovir AF (BIKTARVY) 50-200-25 MG TABS tablet   Other Relevant Orders   COMPLETE METABOLIC PANEL WITH GFR   HIV-1 RNA quant-no reflex-bld   T-helper cell (CD4)- ([facility] clinic only)   Healthcare maintenance    Discussed importance of safe sexual practice and condom use. Condoms and STD testing offered.  Discussed vaccinations and declines Shingrix.  Due for colonoscopy which he declined for now.       Other Visit Diagnoses     Pharmacologic therapy       Relevant Orders   Lipid panel   Screening for STDs (sexually transmitted diseases)       Relevant Orders   RPR        I am having Isac Caddy maintain his lisinopril-hydrochlorothiazide, atorvastatin, metFORMIN, and Biktarvy.   Meds ordered this encounter  Medications   bictegravir-emtricitabine-tenofovir AF (BIKTARVY) 50-200-25 MG TABS tablet    Sig: Take 1 tablet by mouth daily.    Dispense:  30 tablet    Refill:  6    Order Specific Question:   Supervising Provider    Answer:   Judyann Munson [[id number]]     Follow-up: Return in about 6 months (around 01/29/2024), or if symptoms worsen or fail to improve.   Marcos Eke, MSN, FNP-C Nurse Practitioner [facility] for Infectious Disease [facility] Main number: [phone]

## 2023-07-29 NOTE — Assessment & Plan Note (Signed)
William Mcfarland continues to have well controlled virus with good adherence and tolerance to [employer]. Reviewed lab work and discussed plan of care and U equals U. Continue current dose of Biktarvy. Plan for follow up in 6 months or sooner if needed with lab work 1-2 weeks prior to appointment.

## 2024-01-20 ENCOUNTER — Other Ambulatory Visit: Payer: Self-pay

## 2024-01-20 ENCOUNTER — Other Ambulatory Visit: Payer: Medicare PPO

## 2024-01-20 DIAGNOSIS — Z113 Encounter for screening for infections with a predominantly sexual mode of transmission: Secondary | ICD-10-CM

## 2024-01-20 DIAGNOSIS — Z79899 Other long term (current) drug therapy: Secondary | ICD-10-CM

## 2024-01-20 DIAGNOSIS — B20 Human immunodeficiency virus [HIV] disease: Secondary | ICD-10-CM

## 2024-01-22 LAB — LIPID PANEL
Cholesterol: 144 mg/dL (ref <200)
HDL: 51 mg/dL (ref >=40)
LDL Cholesterol (Calc): 69 mg/dL
Non-HDL Cholesterol (Calc): 93 mg/dL (ref <130)
Total CHOL/HDL Ratio: 2.8 (calc) (ref <5.0)
Triglycerides: 164 mg/dL — ABNORMAL HIGH (ref <150)

## 2024-01-22 LAB — RPR: RPR Ser Ql: NONREACTIVE

## 2024-01-22 LAB — HIV-1 RNA QUANT-NO REFLEX-BLD
HIV 1 RNA Quant: 23 {copies}/mL — ABNORMAL HIGH
HIV-1 RNA Quant, Log: 1.36 {Log} — ABNORMAL HIGH

## 2024-01-22 LAB — COMPLETE METABOLIC PANEL WITH GFR
AG Ratio: 1.8 (calc) (ref 1.0–2.5)
ALT: 18 U/L (ref 9–46)
AST: 13 U/L (ref 10–35)
Albumin: 4.8 g/dL (ref 3.6–5.1)
Alkaline phosphatase (APISO): 60 U/L (ref 35–144)
BUN: 16 mg/dL (ref 7–25)
CO2: 26 mmol/L (ref 20–32)
Calcium: 10.4 mg/dL — ABNORMAL HIGH (ref 8.6–10.3)
Chloride: 98 mmol/L (ref 98–110)
Creat: 0.7 mg/dL (ref 0.70–1.35)
Globulin: 2.6 g/dL (ref 1.9–3.7)
Glucose, Bld: 124 mg/dL — ABNORMAL HIGH (ref 65–99)
Potassium: 4.3 mmol/L (ref 3.5–5.3)
Sodium: 139 mmol/L (ref 135–146)
Total Bilirubin: 0.4 mg/dL (ref 0.2–1.2)
Total Protein: 7.4 g/dL (ref 6.1–8.1)
eGFR: 101 mL/min/{1.73_m2} (ref >=60)

## 2024-01-22 LAB — T-HELPER CELL (CD4) - (RCID CLINIC ONLY)
CD4 % Helper T Cell: 44 % (ref 33–65)
CD4 T Cell Abs: 1048 /uL (ref 400–1790)

## 2024-02-03 ENCOUNTER — Encounter: Payer: Self-pay | Admitting: Family

## 2024-02-03 ENCOUNTER — Ambulatory Visit: Payer: Medicare PPO | Admitting: Family

## 2024-02-03 ENCOUNTER — Other Ambulatory Visit: Payer: Self-pay

## 2024-02-03 VITALS — BP 133/85 | HR 91 | Temp 98.1°F | Resp 16 | Wt 194.8 lb

## 2024-02-03 DIAGNOSIS — B20 Human immunodeficiency virus [HIV] disease: Secondary | ICD-10-CM | POA: Diagnosis not present

## 2024-02-03 DIAGNOSIS — A530 Latent syphilis, unspecified as early or late: Secondary | ICD-10-CM | POA: Diagnosis not present

## 2024-02-03 DIAGNOSIS — Z0000 Encounter for general adult medical examination without abnormal findings: Secondary | ICD-10-CM

## 2024-02-03 MED ORDER — BIKTARVY 50-200-25 MG PO TABS: 1.0000 | ORAL_TABLET | Freq: Every day | ORAL | 6 refills | Status: DC

## 2024-02-03 NOTE — Patient Instructions (Addendum)
 Nice to see you.  Continue to take your medication daily as prescribed.  Refills have been sent to the pharmacy.  Plan for follow up in 6 months or sooner if needed with lab work 1-2 weeks prior to appointment.   Have a great day and stay safe!

## 2024-02-03 NOTE — Assessment & Plan Note (Signed)
Most recent syphilis titer non-reactive. No concern for new infection. Continue to monitor.

## 2024-02-03 NOTE — Assessment & Plan Note (Signed)
Mr. Toepfer continues to have well-controlled virus with good adherence and tolerance to [employer].  Reviewed lab work and discussed plan of care and U equals U.  Continue current dose of Biktarvy.  Coverage by Medicaid.  Social determinants of health met.  Plan for follow-up in 6 months or sooner if needed with lab work 1 to 2 weeks prior to appointment.

## 2024-02-03 NOTE — Assessment & Plan Note (Signed)
Discussed importance of safe sexual practice and condom use. Condoms and site specific STD testing offered.  Vaccinations reviewed and recommended shingles and RSV.  Discussed colon cancer screening through colonoscopy and Cologuard. Declines and will speak with Internal Medicine as well. Routine dental care up to date.

## 2024-02-03 NOTE — Progress Notes (Signed)
Brief Narrative   Patient ID: William Mcfarland, male    DOB: 07-08-1956, 68 y.o.   MRN: [id number]  Mr. Vert is a 68 y/o caucasian male initially diagnosed with HIV on 03/20/19 with initial viral load of 1.24 million and CD4 count of 690 and risk factor of MSM. [id number] negative. Genotype was without significant mutations. Entered care at [facility] Stage 1.  No history of opportunistic infection. [id number] negative. Sole medication regimen of Biktarvy.    Subjective:    Chief Complaint  Patient presents with   Follow-up    B20     HPI:  William Mcfarland is a 68 y.o. male with HIV disease last seen on 07/29/2023 with well-controlled virus and good adherence and tolerance to [employer].  Viral load was undetectable with CD4 count 1181.  Kidney function, liver function, electrolytes within normal ranges.  Most recent lab work completed on 2//25 with viral load that remains undetectable and CD4 count of 1048.  Kidney function, liver function, electrolytes within normal ranges.  Lipid profile triglycerides 164, LDL 69, and HDL 51.  Nonfasting glucose was 124.  Here today for routine follow-up.  Mr. Kempton has been doing well since his last office visit and continues to take [facility] as prescribed with no adverse side effects or problems obtaining medication from the pharmacy.  Remains covered by [facility].  Has plans to visit the [place] area in April.  Possibly feeling a little down at times but overall doing well.  Condoms and site-specific STD testing offered.  Healthcare maintenance reviewed.  Routine dental care is up-to-date.  Due for diabetic eye exam and colon cancer screening.'  Denies fevers, chills, night sweats, headaches, changes in vision, neck pain/stiffness, nausea, diarrhea, vomiting, lesions or rashes.  Lab Results  Component Value Date   CD4TCELL 44 01/20/2024   CD4TABS 1,048 01/20/2024   Lab Results  Component Value Date   HIV1RNAQUANT 23 (H) 01/20/2024     No  Known Allergies    Outpatient Medications Prior to Visit  Medication Sig Dispense Refill   atorvastatin (LIPITOR) 10 MG tablet Take 10 mg by mouth in the morning.     lisinopril-hydrochlorothiazide (ZESTORETIC) 20-12.5 MG tablet Take 1 tablet by mouth 2 (two) times a day.     metFORMIN (GLUCOPHAGE) 500 MG tablet Take 1,000 mg by mouth 2 (two) times daily with a meal.     mometasone (ELOCON) 0.1 % ointment Apply topically 2 (two) times daily.     OZEMPIC, 0.25 OR 0.5 MG/DOSE, 2 MG/3ML SOPN INJECT 0.25 MG SUBCUTANEOUSLY ONCE A WEEK FOR 4 WEEKS THEN INCREASE TO 0.5 MG WEEKLY     bictegravir-emtricitabine-tenofovir AF (BIKTARVY) 50-200-25 MG TABS tablet Take 1 tablet by mouth daily. 30 tablet 6   No facility-administered medications prior to visit.     Past Medical History:  Diagnosis Date   Arthritis    shoulders, knees, hips   At risk for sleep apnea    STOP-BANG= 5     SENT TO PCP 04-12-2015   History of colon polyps    History of radiation therapy 45 Gy plus seed implants on 04-13-2015   prostate external beam 02-20-2015 to 03-24-2015   HIV (human immunodeficiency virus infection) (HCC)    dx in 2020.   Hyperlipidemia    Hypertension    Lower urinary tract symptoms (LUTS)    Prostate cancer [facility]) urologist-  dr ottelin/  oncologist- dr Kathrynn Running   T1c, Gleason 4+3,  PSA 6.83,  vol. 32.56cc--  s/p  external beam radiation 02-20-2015 to 03-24-2015   Type 2 diabetes mellitus (HCC)    Type 2 diabetes, diet controlled (HCC)    Wears glasses      Past Surgical History:  Procedure Laterality Date   COLONOSCOPY W/ POLYPECTOMY  2013   EYE SURGERY     Cornea replacement left eye (2018)   JOINT REPLACEMENT     ORIF LEFT LEG FX  1988   ORIF RIGHT ANKLE FX  1985   PROSTATE BIOPSY     RADIOACTIVE SEED IMPLANT N/A 04/14/2015   Procedure: RADIOACTIVE SEED IMPLANT;  Surgeon: Ihor Gully, MD;  Location: [facility] [place];  Service: Urology;  Laterality: N/A;   64     seeds  implanted no seeds found in bladder   TOTAL HIP ARTHROPLASTY Left 06/13/2021   Procedure: LEFT TOTAL HIP ARTHROPLASTY ANTERIOR APPROACH;  Surgeon: Tarry Kos, MD;  Location: [facility] OR;  Service: Orthopedics;  Laterality: Left;  3-C   TOTAL KNEE ARTHROPLASTY Left 01/16/2021   Procedure: LEFT TOTAL KNEE ARTHROPLASTY;  Surgeon: Cammy Copa, MD;  Location: [facility] OR;  Service: Orthopedics;  Laterality: Left;   TOTAL SHOULDER ARTHROPLASTY Right 05/21/2016   Procedure: TOTAL SHOULDER ARTHROPLASTY;  Surgeon: Cammy Copa, MD;  Location: [facility] OR;  Service: Orthopedics;  Laterality: Right;   TOTAL SHOULDER ARTHROPLASTY Left 10/05/2019   Procedure: LEFT Total shoulder arthroplasty;  Surgeon: Cammy Copa, MD;  Location: [facility] OR;  Service: Orthopedics;  Laterality: Left;      Review of Systems  Constitutional:  Negative for appetite change, chills, fatigue, fever and unexpected weight change.  Eyes:  Negative for visual disturbance.  Respiratory:  Negative for cough, chest tightness, shortness of breath and wheezing.   Cardiovascular:  Negative for chest pain and leg swelling.  Gastrointestinal:  Negative for abdominal pain, constipation, diarrhea, nausea and vomiting.  Genitourinary:  Negative for dysuria, flank pain, frequency, genital sores, hematuria and urgency.  Skin:  Negative for rash.  Allergic/Immunologic: Negative for immunocompromised state.  Neurological:  Negative for dizziness and headaches.      Objective:    BP 133/85   Pulse 91   Temp 98.1 F (36.7 C) (Oral)   Resp 16   Wt 194 lb 12.8 oz (88.4 kg)   SpO2 96%   BMI 32.42 kg/m  Nursing note and vital signs reviewed.  Physical Exam Constitutional:      General: He is not in acute distress.    Appearance: He is well-developed.  Eyes:     Conjunctiva/sclera: Conjunctivae normal.  Cardiovascular:     Rate and Rhythm: Normal rate and regular rhythm.     Heart sounds: Normal heart sounds. No murmur heard.    No  friction rub. No gallop.  Pulmonary:     Effort: Pulmonary effort is normal. No respiratory distress.     Breath sounds: Normal breath sounds. No wheezing or rales.  Chest:     Chest wall: No tenderness.  Abdominal:     General: Bowel sounds are normal.     Palpations: Abdomen is soft.     Tenderness: There is no abdominal tenderness.  Musculoskeletal:     Cervical back: Neck supple.  Lymphadenopathy:     Cervical: No cervical adenopathy.  Skin:    General: Skin is warm and dry.     Findings: No rash.  Neurological:     Mental Status: He is alert and oriented to person, place, and time.  Psychiatric:        Behavior: Behavior normal.        Thought Content: Thought content normal.        Judgment: Judgment normal.         02/03/2024    8:37 AM 07/29/2023    8:41 AM 02/04/2023    9:15 AM 07/30/2022    9:08 AM 09/20/2021    4:01 PM  Depression screen PHQ 2/9  Decreased Interest 0 0 0 0 1  Down, Depressed, Hopeless 1 0 1 0 1  PHQ - 2 Score 1 0 1 0 2  Altered sleeping     0  Tired, decreased energy     0  Change in appetite     0  Feeling bad or failure about yourself      1  Trouble concentrating     1  Moving slowly or fidgety/restless     0  Suicidal thoughts     0  PHQ-9 Score     4       Assessment & Plan:    Patient Active Problem List   Diagnosis Date Noted   Adjustment disorder with mixed anxiety and depressed mood 02/04/2023   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   History of syphilis 07/30/2022   History of malignant neoplasm of prostate 07/30/2022   Therapeutic drug monitoring 07/30/2022   Status post total replacement of left hip 06/13/2021   Primary osteoarthritis of left hip 06/12/2021   Arthritis of left knee    S/P total knee arthroplasty, left 01/16/2021   Gout 07/03/2020   OA (osteoarthritis) of shoulder 10/05/2019   Healthcare maintenance 06/03/2019   Human immunodeficiency virus (HIV) disease (HCC) 05/03/2019   Type 2 diabetes mellitus with  hyperglycemia, without long-term current use of insulin (HCC) 05/03/2019   Essential hypertension 05/03/2019   Class 1 obesity due to excess calories with serious comorbidity and body mass index (BMI) of 32.0 to 32.9 in adult 05/03/2019   Degenerative arthritis of shoulder region 05/21/2016   Malignant neoplasm of prostate (HCC) 11/20/2014   Travel advice encounter 04/01/2014     Problem List Items Addressed This Visit       Other   Human immunodeficiency virus (HIV) disease (HCC) - Primary   Mr. Arnesen continues to have well-controlled virus with good adherence and tolerance to Biktarvy.  Reviewed lab work and discussed plan of care and U equals U.  Continue current dose of Biktarvy.  Coverage by Medicaid.  Social determinants of health met.  Plan for follow-up in 6 months or sooner if needed with lab work 1 to 2 weeks prior to appointment.      Relevant Medications   bictegravir-emtricitabine-tenofovir AF (BIKTARVY) 50-200-25 MG TABS tablet   Healthcare maintenance   Discussed importance of safe sexual practice and condom use. Condoms and site specific STD testing offered.  Vaccinations reviewed and recommended shingles and RSV.  Discussed colon cancer screening through colonoscopy and Cologuard. Declines and will speak with Internal Medicine as well. Routine dental care up to date.       Latent syphilis   Most recent syphilis titer non-reactive. No concern for new infection. Continue to monitor.      Relevant Medications   bictegravir-emtricitabine-tenofovir AF (BIKTARVY) 50-200-25 MG TABS tablet     I am having Isac Caddy maintain his lisinopril-hydrochlorothiazide, atorvastatin, metFORMIN, mometasone, Ozempic (0.25 or 0.5 MG/DOSE), and Biktarvy.   Meds ordered this encounter  Medications   bictegravir-emtricitabine-tenofovir AF (BIKTARVY) 50-200-25 MG  TABS tablet    Sig: Take 1 tablet by mouth daily.    Dispense:  30 tablet    Refill:  6    Supervising  Provider:   Judyann Munson [phone]    Prescription Type::   Renewal     Follow-up: Return in about 6 months (around 08/02/2024). or sooner if needed.    Marcos Eke, MSN, FNP-C Nurse Practitioner [facility] Main number: [phone]

## 2024-07-30 ENCOUNTER — Other Ambulatory Visit: Payer: Self-pay

## 2024-07-30 DIAGNOSIS — Z79899 Other long term (current) drug therapy: Secondary | ICD-10-CM

## 2024-07-30 DIAGNOSIS — B20 Human immunodeficiency virus [HIV] disease: Secondary | ICD-10-CM

## 2024-07-30 DIAGNOSIS — Z113 Encounter for screening for infections with a predominantly sexual mode of transmission: Secondary | ICD-10-CM

## 2024-08-03 ENCOUNTER — Other Ambulatory Visit: Payer: Medicare PPO

## 2024-08-03 ENCOUNTER — Other Ambulatory Visit: Payer: Self-pay

## 2024-08-03 DIAGNOSIS — Z79899 Other long term (current) drug therapy: Secondary | ICD-10-CM

## 2024-08-03 DIAGNOSIS — Z113 Encounter for screening for infections with a predominantly sexual mode of transmission: Secondary | ICD-10-CM

## 2024-08-03 DIAGNOSIS — B20 Human immunodeficiency virus [HIV] disease: Secondary | ICD-10-CM

## 2024-08-04 LAB — T-HELPER CELL (CD4) - (RCID CLINIC ONLY)
CD4 % Helper T Cell: 48 % (ref 33–65)
CD4 T Cell Abs: 948 /uL (ref 400–1790)

## 2024-08-05 LAB — CBC WITH DIFFERENTIAL/PLATELET
Absolute Lymphocytes: 2187 {cells}/uL (ref 850–3900)
Absolute Monocytes: 462 {cells}/uL (ref 200–950)
Basophils Absolute: 73 {cells}/uL (ref 0–200)
Basophils Relative: 0.9 %
Eosinophils Absolute: 162 {cells}/uL (ref 15–500)
Eosinophils Relative: 2 %
HCT: 46.5 % (ref 38.5–50.0)
Hemoglobin: 15.6 g/dL (ref 13.2–17.1)
MCH: 32 pg (ref 27.0–33.0)
MCHC: 33.5 g/dL (ref 32.0–36.0)
MCV: 95.3 fL (ref 80.0–100.0)
MPV: 9.6 fL (ref 7.5–12.5)
Monocytes Relative: 5.7 %
Neutro Abs: 5216 {cells}/uL (ref 1500–7800)
Neutrophils Relative %: 64.4 %
Platelets: 338 Thousand/uL (ref 140–400)
RBC: 4.88 Million/uL (ref 4.20–5.80)
RDW: 12.6 % (ref 11.0–15.0)
Total Lymphocyte: 27 %
WBC: 8.1 Thousand/uL (ref 3.8–10.8)

## 2024-08-05 LAB — COMPLETE METABOLIC PANEL WITHOUT GFR
AG Ratio: 1.8 (calc) (ref 1.0–2.5)
ALT: 27 U/L (ref 9–46)
AST: 16 U/L (ref 10–35)
Albumin: 4.6 g/dL (ref 3.6–5.1)
Alkaline phosphatase (APISO): 52 U/L (ref 35–144)
BUN: 18 mg/dL (ref 7–25)
CO2: 28 mmol/L (ref 20–32)
Calcium: 10.2 mg/dL (ref 8.6–10.3)
Chloride: 100 mmol/L (ref 98–110)
Creat: 0.87 mg/dL (ref 0.70–1.35)
Globulin: 2.5 g/dL (ref 1.9–3.7)
Glucose, Bld: 127 mg/dL — ABNORMAL HIGH (ref 65–99)
Potassium: 4.1 mmol/L (ref 3.5–5.3)
Sodium: 138 mmol/L (ref 135–146)
Total Bilirubin: 0.4 mg/dL (ref 0.2–1.2)
Total Protein: 7.1 g/dL (ref 6.1–8.1)

## 2024-08-05 LAB — HIV-1 RNA QUANT-NO REFLEX-BLD
HIV 1 RNA Quant: NOT DETECTED {copies}/mL
HIV-1 RNA Quant, Log: NOT DETECTED {Log_copies}/mL

## 2024-08-05 LAB — RPR: RPR Ser Ql: NONREACTIVE

## 2024-08-09 ENCOUNTER — Other Ambulatory Visit: Payer: Self-pay | Admitting: Family

## 2024-08-09 ENCOUNTER — Ambulatory Visit: Payer: Self-pay | Admitting: Family

## 2024-08-09 DIAGNOSIS — B20 Human immunodeficiency virus [HIV] disease: Secondary | ICD-10-CM

## 2024-08-17 ENCOUNTER — Ambulatory Visit: Payer: Self-pay | Admitting: Family

## 2024-08-17 ENCOUNTER — Encounter: Payer: Self-pay | Admitting: Family

## 2024-08-17 ENCOUNTER — Other Ambulatory Visit: Payer: Self-pay

## 2024-08-17 VITALS — BP 124/71 | HR 63 | Temp 97.5°F | Wt 180.0 lb

## 2024-08-17 DIAGNOSIS — B20 Human immunodeficiency virus [HIV] disease: Secondary | ICD-10-CM

## 2024-08-17 DIAGNOSIS — Z0000 Encounter for general adult medical examination without abnormal findings: Secondary | ICD-10-CM

## 2024-08-17 MED ORDER — BIKTARVY 50-200-25 MG PO TABS: 1.0000 | ORAL_TABLET | Freq: Every day | ORAL | 5 refills | Status: AC

## 2024-08-17 MED ORDER — MUPIROCIN 2 % EX OINT: 1.0000 | TOPICAL_OINTMENT | Freq: Two times a day (BID) | CUTANEOUS | 0 refills | Status: AC

## 2024-08-17 NOTE — Patient Instructions (Signed)
 Nice to see you.  Continue to take your medication daily as prescribed.  Refills have been sent to the pharmacy.  Plan for follow up in 6 months or sooner if needed with lab work 1-2 weeks prior to appointment.   Have a great day and stay safe!

## 2024-08-17 NOTE — Progress Notes (Signed)
 Brief Narrative   Patient ID: William Mcfarland, male    DOB: 05/12/1956, 68 y.o.   MRN: [id number]  William Mcfarland is a 68 y/o caucasian male initially diagnosed with HIV on 03/20/19 with initial viral load of 1.24 million and CD4 count of 690 and risk factor of MSM. [id number] negative. Genotype was without significant mutations. Entered care at [facility] Stage 1.  No history of opportunistic infection. [id number] negative. Sole medication regimen of Biktarvy .    Subjective:   Chief Complaint  Patient presents with   Follow-up    HPI:  William Mcfarland is a 68 y.o. male with HIV disease last seen on 02/03/2024 with well-controlled virus and good adherence and tolerance to Biktarvy .  Viral load was undetectable with CD4 count 1046.  RPR was nonreactive for syphilis.  Kidney function, liver function, electrolytes within normal ranges.  Most recent lab work completed on 08/03/2024 with viral load that remains undetectable and CD4 count 948.  RPR remains nonreactive and kidney function, liver function, electrolytes within normal ranges.  Here today for routine follow-up.  William Mcfarland has been doing well since his last office visit and continues to take Biktarvy  as prescribed with no adverse side effects.  No problems obtaining medication from the pharmacy and covered by [facility].  Overall feeling well with no new concerns/complaints.  Housing, transportation, and access to food are stable.  Plans on attending a [employer] reunion in [place] in a couple weeks.  Healthcare maintenance reviewed.  Not currently sexually active.  Denies fevers, chills, night sweats, headaches, changes in vision, neck pain/stiffness, nausea, diarrhea, vomiting, lesions or rashes.  Lab Results  Component Value Date   CD4TCELL 48 08/03/2024   CD4TABS 948 08/03/2024   Lab Results  Component Value Date   HIV1RNAQUANT NOT DETECTED 08/03/2024     No Known Allergies    Outpatient Medications Prior to Visit   Medication Sig Dispense Refill   lisinopril -hydrochlorothiazide  (ZESTORETIC ) 20-12.5 MG tablet Take 1 tablet by mouth 2 (two) times a day.     metFORMIN  (GLUCOPHAGE ) 500 MG tablet Take 1,000 mg by mouth 2 (two) times daily with a meal.     OZEMPIC, 0.25 OR 0.5 MG/DOSE, 2 MG/3ML SOPN INJECT 0.25 MG SUBCUTANEOUSLY ONCE A WEEK FOR 4 WEEKS THEN INCREASE TO 0.5 MG WEEKLY     rosuvastatin (CRESTOR) 20 MG tablet Take 20 mg by mouth daily.     BIKTARVY  50-200-25 MG TABS tablet TAKE 1 TABLET EVERY DAY 30 tablet 5   atorvastatin  (LIPITOR) 10 MG tablet Take 10 mg by mouth in the morning. (Patient not taking: Reported on 08/17/2024)     mometasone (ELOCON) 0.1 % ointment Apply topically 2 (two) times daily. (Patient not taking: Reported on 08/17/2024)     No facility-administered medications prior to visit.     Past Medical History:  Diagnosis Date   Arthritis    shoulders, knees, hips   At risk for sleep apnea    STOP-BANG= 5     SENT TO PCP 04-12-2015   History of colon polyps    History of radiation therapy 45 Gy plus seed implants on 04-13-2015   prostate external beam 02-20-2015 to 03-24-2015   HIV (human immunodeficiency virus infection) (HCC)    dx in 2020.   Hyperlipidemia    Hypertension    Lower urinary tract symptoms (LUTS)    Prostate cancer [facility]) urologist-  dr ottelin/  oncologist- dr patrcia   T1c, Gleason 4+3,  PSA 6.83,  vol. 32.56cc--  s/p  external beam radiation 02-20-2015 to 03-24-2015   Type 2 diabetes mellitus (HCC)    Type 2 diabetes, diet controlled (HCC)    Wears glasses      Past Surgical History:  Procedure Laterality Date   COLONOSCOPY W/ POLYPECTOMY  2013   EYE SURGERY     Cornea replacement left eye (2018)   JOINT REPLACEMENT     ORIF LEFT LEG FX  1988   ORIF RIGHT ANKLE FX  1985   PROSTATE BIOPSY     RADIOACTIVE SEED IMPLANT N/A 04/14/2015   Procedure: RADIOACTIVE SEED IMPLANT;  Surgeon: Mark Ottelin, MD;  Location: [facility] [place];  Service:  Urology;  Laterality: N/A;   64     seeds implanted no seeds found in bladder   TOTAL HIP ARTHROPLASTY Left 06/13/2021   Procedure: LEFT TOTAL HIP ARTHROPLASTY ANTERIOR APPROACH;  Surgeon: Jerri Kay HERO, MD;  Location: [facility] OR;  Service: Orthopedics;  Laterality: Left;  3-C   TOTAL KNEE ARTHROPLASTY Left 01/16/2021   Procedure: LEFT TOTAL KNEE ARTHROPLASTY;  Surgeon: Addie Cordella [facility], MD;  Location: [facility] OR;  Service: Orthopedics;  Laterality: Left;   TOTAL SHOULDER ARTHROPLASTY Right 05/21/2016   Procedure: TOTAL SHOULDER ARTHROPLASTY;  Surgeon: [facility] Cordella Addie, MD;  Location: [facility] OR;  Service: Orthopedics;  Laterality: Right;   TOTAL SHOULDER ARTHROPLASTY Left 10/05/2019   Procedure: LEFT Total shoulder arthroplasty;  Surgeon: Addie Cordella [facility], MD;  Location: [facility] OR;  Service: Orthopedics;  Laterality: Left;        Review of Systems  Constitutional:  Negative for appetite change, chills, fatigue, fever and unexpected weight change.  Eyes:  Negative for visual disturbance.  Respiratory:  Negative for cough, chest tightness, shortness of breath and wheezing.   Cardiovascular:  Negative for chest pain and leg swelling.  Gastrointestinal:  Negative for abdominal pain, constipation, diarrhea, nausea and vomiting.  Genitourinary:  Negative for dysuria, flank pain, frequency, genital sores, hematuria and urgency.  Skin:  Negative for rash.  Allergic/Immunologic: Negative for immunocompromised state.  Neurological:  Negative for dizziness and headaches.     Objective:   BP 124/71   Pulse 63   Temp (!) 97.5 F (36.4 C) (Oral)   Wt 180 lb (81.6 kg)   SpO2 94%   BMI 29.95 kg/m  Nursing note and vital signs reviewed.  Physical Exam Constitutional:      General: He is not in acute distress.    Appearance: He is well-developed.  Eyes:     Conjunctiva/sclera: Conjunctivae normal.  Cardiovascular:     Rate and Rhythm: Normal rate and regular rhythm.     Heart sounds: Normal heart  sounds. No murmur heard.    No friction rub. No gallop.  Pulmonary:     Effort: Pulmonary effort is normal. No respiratory distress.     Breath sounds: Normal breath sounds. No wheezing or rales.  Chest:     Chest wall: No tenderness.  Abdominal:     General: Bowel sounds are normal.     Palpations: Abdomen is soft.     Tenderness: There is no abdominal tenderness.  Musculoskeletal:     Cervical back: Neck supple.  Lymphadenopathy:     Cervical: No cervical adenopathy.  Skin:    General: Skin is warm and dry.     Findings: No rash.  Neurological:     Mental Status: He is alert and oriented to person, place, and time.  Psychiatric:  Behavior: Behavior normal.        Thought Content: Thought content normal.        Judgment: Judgment normal.          08/17/2024   10:08 AM 02/03/2024    8:37 AM 07/29/2023    8:41 AM 02/04/2023    9:15 AM 07/30/2022    9:08 AM  Depression screen PHQ 2/9  Decreased Interest 1 0 0 0 0  Down, Depressed, Hopeless 0 1 0 1 0  PHQ - 2 Score 1 1 0 1 0  Altered sleeping 1      Tired, decreased energy 1      Change in appetite 0      Feeling bad or failure about yourself  0      Trouble concentrating 1      Moving slowly or fidgety/restless 0      Suicidal thoughts 0      PHQ-9 Score 4      Difficult doing work/chores Somewhat difficult            08/17/2024   10:08 AM  GAD 7 : Generalized Anxiety Score  Nervous, Anxious, on Edge 1  Control/stop worrying 0  Worry too much - different things 0  Trouble relaxing 0  Restless 1  Easily annoyed or irritable 1  Afraid - awful might happen 0  Total GAD 7 Score 3  Anxiety Difficulty Somewhat difficult     The 10-year ASCVD risk score (Arnett DK, et al., 2019) is: 23.8%   Values used to calculate the score:     Age: 85 years     Clincally relevant sex: Male     Is Non-Hispanic African American: No     Diabetic: Yes     Tobacco smoker: No     Systolic Blood Pressure: 124 mmHg     Is BP  treated: Yes     HDL Cholesterol: 51 mg/dL     Total Cholesterol: 144 mg/dL      Assessment & Plan:    Patient Active Problem List   Diagnosis Date Noted   Adjustment disorder with mixed anxiety and depressed mood 02/04/2023   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   History of syphilis 07/30/2022   History of malignant neoplasm of prostate 07/30/2022   Therapeutic drug monitoring 07/30/2022   Status post total replacement of left hip 06/13/2021   Primary osteoarthritis of left hip 06/12/2021   Arthritis of left knee    S/P total knee arthroplasty, left 01/16/2021   Gout 07/03/2020   OA (osteoarthritis) of shoulder 10/05/2019   Healthcare maintenance 06/03/2019   Human immunodeficiency virus (HIV) disease (HCC) 05/03/2019   Type 2 diabetes mellitus with hyperglycemia, without long-term current use of insulin  (HCC) 05/03/2019   Essential hypertension 05/03/2019   Class 1 obesity due to excess calories with serious comorbidity and body mass index (BMI) of 32.0 to 32.9 in adult 05/03/2019   Degenerative arthritis of shoulder region 05/21/2016   Malignant neoplasm of prostate (HCC) 11/20/2014   Travel advice encounter 04/01/2014     Problem List Items Addressed This Visit       Other   Human immunodeficiency virus (HIV) disease (HCC)   William Mcfarland continues to have well-controlled virus with good adherence and tolerance to Biktarvy .  Reviewed lab work and discussed plan of care and U equals U.  No problems obtaining medication from the pharmacy.  Social determinants of health reviewed with no interventions indicated.  Continue current dose of  Biktarvy .  Plan for follow-up in 6 months or sooner if needed with lab work 1 to 2 weeks prior to appointment.      Relevant Medications   bictegravir-emtricitabine -tenofovir  AF (BIKTARVY ) 50-200-25 MG TABS tablet   mupirocin  ointment (BACTROBAN ) 2 %   Other Relevant Orders   Comprehensive metabolic panel with GFR   HIV-1 RNA quant-no reflex-bld    T-helper cell (CD4)- ([facility] clinic only)   Healthcare maintenance - Primary   Not currently sexually active. Vaccinations reviewed and encouraged to complete Shingrix and influenza vaccine when available. Due for colonoscopy with recommendation to follow-up with gastroenterology or Cologuard. Dental care is up to date.       Other Visit Diagnoses       Screening for STDs (sexually transmitted diseases)            I have changed Lonni HERO. Carlin's Biktarvy . I am also having him start on mupirocin  ointment. Additionally, I am having him maintain his lisinopril -hydrochlorothiazide , atorvastatin , metFORMIN , mometasone, Ozempic (0.25 or 0.5 MG/DOSE), and rosuvastatin.   Meds ordered this encounter  Medications   bictegravir-emtricitabine -tenofovir  AF (BIKTARVY ) 50-200-25 MG TABS tablet    Sig: Take 1 tablet by mouth daily.    Dispense:  30 tablet    Refill:  5    Supervising Provider:   LUIZ CHANNEL [phone]    Prescription Type::   Renewal   mupirocin  ointment (BACTROBAN ) 2 %    Sig: Apply 1 Application topically 2 (two) times daily.    Dispense:  22 g    Refill:  0    Supervising Provider:   LUIZ CHANNEL [[id number]]     Follow-up: Return in about 6 months (around 02/14/2025). or sooner if needed.    Cathlyn July, MSN, FNP-C Nurse Practitioner [facility] Main number: [phone]

## 2024-08-17 NOTE — Assessment & Plan Note (Signed)
 Not currently sexually active. Vaccinations reviewed and encouraged to complete Shingrix and influenza vaccine when available. Due for colonoscopy with recommendation to follow-up with gastroenterology or Cologuard. Dental care is up to date.

## 2024-08-17 NOTE — Assessment & Plan Note (Signed)
 William Mcfarland continues to have well-controlled virus with good adherence and tolerance to Biktarvy .  Reviewed lab work and discussed plan of care and U equals U.  No problems obtaining medication from the pharmacy.  Social determinants of health reviewed with no interventions indicated.  Continue current dose of Biktarvy .  Plan for follow-up in 6 months or sooner if needed with lab work 1 to 2 weeks prior to appointment.

## 2025-02-17 ENCOUNTER — Other Ambulatory Visit

## 2025-03-03 ENCOUNTER — Encounter: Payer: Self-pay | Admitting: Family
